# Patient Record
Sex: Female | Born: 1960 | Race: White | Hispanic: No | Marital: Married | Smoking: Never smoker
Health system: Southern US, Community
[De-identification: ages and names within clinical notes are randomized; demographics above are authoritative.]

## PROBLEM LIST (undated history)

## (undated) DIAGNOSIS — M1990 Unspecified osteoarthritis, unspecified site: Secondary | ICD-10-CM

## (undated) DIAGNOSIS — F909 Attention-deficit hyperactivity disorder, unspecified type: Secondary | ICD-10-CM

## (undated) DIAGNOSIS — I10 Essential (primary) hypertension: Secondary | ICD-10-CM

## (undated) DIAGNOSIS — J45909 Unspecified asthma, uncomplicated: Secondary | ICD-10-CM

## (undated) DIAGNOSIS — T7840XA Allergy, unspecified, initial encounter: Secondary | ICD-10-CM

## (undated) DIAGNOSIS — F419 Anxiety disorder, unspecified: Secondary | ICD-10-CM

## (undated) DIAGNOSIS — M8580 Other specified disorders of bone density and structure, unspecified site: Secondary | ICD-10-CM

## (undated) HISTORY — DX: Anxiety disorder, unspecified: F41.9

## (undated) HISTORY — DX: Attention-deficit hyperactivity disorder, unspecified type: F90.9

## (undated) HISTORY — DX: Unspecified asthma, uncomplicated: J45.909

## (undated) HISTORY — DX: Allergy, unspecified, initial encounter: T78.40XA

## (undated) HISTORY — PX: FOOT SURGERY: SHX648

## (undated) HISTORY — DX: Unspecified osteoarthritis, unspecified site: M19.90

## (undated) HISTORY — DX: Other specified disorders of bone density and structure, unspecified site: M85.80

## (undated) HISTORY — DX: Essential (primary) hypertension: I10

---

## 1999-04-30 ENCOUNTER — Other Ambulatory Visit: Admission: RE | Admit: 1999-04-30 | Discharge: 1999-04-30 | Payer: Self-pay | Admitting: Gynecology

## 2000-05-05 ENCOUNTER — Other Ambulatory Visit: Admission: RE | Admit: 2000-05-05 | Discharge: 2000-05-05 | Payer: Self-pay | Admitting: Gynecology

## 2000-06-30 ENCOUNTER — Other Ambulatory Visit: Admission: RE | Admit: 2000-06-30 | Discharge: 2000-06-30 | Payer: Self-pay | Admitting: Gynecology

## 2001-02-05 ENCOUNTER — Other Ambulatory Visit: Admission: RE | Admit: 2001-02-05 | Discharge: 2001-02-05 | Payer: Self-pay | Admitting: Gynecology

## 2001-07-01 ENCOUNTER — Other Ambulatory Visit: Admission: RE | Admit: 2001-07-01 | Discharge: 2001-07-01 | Payer: Self-pay | Admitting: Gynecology

## 2002-03-18 ENCOUNTER — Encounter: Admission: RE | Admit: 2002-03-18 | Discharge: 2002-03-18 | Payer: Self-pay | Admitting: Gynecology

## 2002-03-18 ENCOUNTER — Encounter: Payer: Self-pay | Admitting: Gynecology

## 2002-05-18 ENCOUNTER — Encounter: Admission: RE | Admit: 2002-05-18 | Discharge: 2002-05-18 | Payer: Self-pay | Admitting: Internal Medicine

## 2002-05-18 ENCOUNTER — Encounter: Payer: Self-pay | Admitting: Internal Medicine

## 2002-07-29 ENCOUNTER — Other Ambulatory Visit: Admission: RE | Admit: 2002-07-29 | Discharge: 2002-07-29 | Payer: Self-pay | Admitting: Gynecology

## 2003-02-24 ENCOUNTER — Other Ambulatory Visit: Admission: RE | Admit: 2003-02-24 | Discharge: 2003-02-24 | Payer: Self-pay | Admitting: Gynecology

## 2003-10-28 ENCOUNTER — Encounter: Admission: RE | Admit: 2003-10-28 | Discharge: 2003-10-28 | Payer: Self-pay | Admitting: Gynecology

## 2004-03-07 ENCOUNTER — Other Ambulatory Visit: Admission: RE | Admit: 2004-03-07 | Discharge: 2004-03-07 | Payer: Self-pay | Admitting: Gynecology

## 2005-03-19 ENCOUNTER — Ambulatory Visit: Payer: Self-pay | Admitting: Family Medicine

## 2005-04-23 ENCOUNTER — Other Ambulatory Visit: Admission: RE | Admit: 2005-04-23 | Discharge: 2005-04-23 | Payer: Self-pay | Admitting: Gynecology

## 2005-05-15 ENCOUNTER — Encounter: Admission: RE | Admit: 2005-05-15 | Discharge: 2005-05-15 | Payer: Self-pay | Admitting: Gynecology

## 2005-06-03 ENCOUNTER — Encounter: Admission: RE | Admit: 2005-06-03 | Discharge: 2005-06-03 | Payer: Self-pay | Admitting: Gynecology

## 2005-12-17 ENCOUNTER — Encounter: Admission: RE | Admit: 2005-12-17 | Discharge: 2005-12-17 | Payer: Self-pay | Admitting: Gynecology

## 2006-10-28 ENCOUNTER — Encounter: Admission: RE | Admit: 2006-10-28 | Discharge: 2006-10-28 | Payer: Self-pay | Admitting: Gynecology

## 2006-10-28 ENCOUNTER — Other Ambulatory Visit: Admission: RE | Admit: 2006-10-28 | Discharge: 2006-10-28 | Payer: Self-pay | Admitting: Gynecology

## 2006-10-30 ENCOUNTER — Encounter: Admission: RE | Admit: 2006-10-30 | Discharge: 2006-10-30 | Payer: Self-pay | Admitting: Gynecology

## 2007-12-24 ENCOUNTER — Encounter: Admission: RE | Admit: 2007-12-24 | Discharge: 2007-12-24 | Payer: Self-pay | Admitting: Gynecology

## 2009-01-03 ENCOUNTER — Encounter: Payer: Self-pay | Admitting: Family Medicine

## 2009-01-04 LAB — CONVERTED CEMR LAB
HDL: 94 mg/dL
LDL Cholesterol: 125 mg/dL
Pap Smear: NORMAL

## 2009-01-05 ENCOUNTER — Encounter: Admission: RE | Admit: 2009-01-05 | Discharge: 2009-01-05 | Payer: Self-pay | Admitting: Gynecology

## 2009-01-11 LAB — HM MAMMOGRAPHY: HM Mammogram: NORMAL

## 2009-02-01 ENCOUNTER — Encounter: Payer: Self-pay | Admitting: Family Medicine

## 2009-02-17 ENCOUNTER — Ambulatory Visit: Payer: Self-pay | Admitting: Family Medicine

## 2009-02-17 ENCOUNTER — Encounter: Payer: Self-pay | Admitting: Family Medicine

## 2009-02-17 DIAGNOSIS — M949 Disorder of cartilage, unspecified: Secondary | ICD-10-CM

## 2009-02-17 DIAGNOSIS — M2550 Pain in unspecified joint: Secondary | ICD-10-CM | POA: Insufficient documentation

## 2009-02-17 DIAGNOSIS — M899 Disorder of bone, unspecified: Secondary | ICD-10-CM

## 2009-02-17 DIAGNOSIS — M8580 Other specified disorders of bone density and structure, unspecified site: Secondary | ICD-10-CM | POA: Insufficient documentation

## 2009-02-17 DIAGNOSIS — M1990 Unspecified osteoarthritis, unspecified site: Secondary | ICD-10-CM | POA: Insufficient documentation

## 2009-02-17 LAB — CONVERTED CEMR LAB: Anti Nuclear Antibody(ANA): NEGATIVE

## 2009-02-20 ENCOUNTER — Encounter: Payer: Self-pay | Admitting: Family Medicine

## 2009-02-20 LAB — CONVERTED CEMR LAB
Basophils Absolute: 0 10*3/uL (ref 0.0–0.1)
HCT: 37.7 % (ref 36.0–46.0)
Lymphs Abs: 1.7 10*3/uL (ref 0.7–4.0)
Monocytes Relative: 9.9 % (ref 3.0–12.0)
Platelets: 212 10*3/uL (ref 150.0–400.0)
RDW: 12.3 % (ref 11.5–14.6)
Rhuematoid fact SerPl-aCnc: 20.1 intl units/mL — ABNORMAL HIGH (ref 0.0–20.0)

## 2009-03-24 ENCOUNTER — Encounter: Payer: Self-pay | Admitting: Family Medicine

## 2009-06-26 ENCOUNTER — Ambulatory Visit: Payer: Self-pay | Admitting: Family Medicine

## 2010-03-05 ENCOUNTER — Encounter: Admission: RE | Admit: 2010-03-05 | Discharge: 2010-03-05 | Payer: Self-pay | Admitting: Gynecology

## 2010-05-15 NOTE — Assessment & Plan Note (Signed)
Summary: R THUMB PAIN/CLE   Vital Signs:  Patient profile:   50 year old female Weight:      121.50 pounds Temp:     98.5 degrees F oral Pulse rate:   64 / minute Pulse rhythm:   regular BP sitting:   122 / 84  (left arm) Cuff size:   regular  Vitals Entered By: Sydell Axon LPN (June 26, 2009 2:18 PM) CC: Right thumb and wrist pain X 3-4 days, may have injured hand pulling on her goats   History of Present Illness: Pt here for a sore thumb which she thinks she injured pulling on her goats. It is minimally swollen but hurts in almost any direction. She has not had problems with this befiore and difd not hit it on anything.   Problems Prior to Update: 1)  Preventive Health Care  (ICD-V70.0) 2)  Osteopenia  (ICD-733.90) 3)  Osteoarthritis  (ICD-715.90) 4)  Polyarthritis  (ICD-719.49)  Medications Prior to Update: 1)  Lexapro 20 Mg Tabs (Escitalopram Oxalate) .... Take 1 Tablet By Mouth Once A Day 2)  Loratadine 10 Mg Tabs (Loratadine) .... Take 1 Tablet By Mouth Once A Day 3)  Vitamin D 1000 Unit  Tabs (Cholecalciferol) .... 4,000 International Units Daily  Allergies: No Known Drug Allergies  Physical Exam  General:  Well-developed,well-nourished,in no acute distress; alert,appropriate and cooperative throughout examination Msk:  Pt is LHD, her right thumb is sore with ROM slightly decreased from discomfort. She has slight amt of echymosis in the volar aspect of the wr8ist over the wrist creases but no swelling or tenderness to palpation.    Impression & Recommendations:  Problem # 1:  STRAIN OF THUMB OF RIGHT  HAND, LHD (ICD-842.10) Assessment New Discussed heat and ice. Applied Ace wrap and suggested using only half the length.  Complete Medication List: 1)  Lexapro 20 Mg Tabs (Escitalopram oxalate) .... Take 1 tablet by mouth once a day 2)  Loratadine 10 Mg Tabs (Loratadine) .... Take 1 tablet by mouth once a day 3)  Vitamin D 1000 Unit Tabs (Cholecalciferol) ....  4,000 international units daily  Current Allergies (reviewed today): No known allergies

## 2011-04-05 ENCOUNTER — Other Ambulatory Visit: Payer: Self-pay | Admitting: Gynecology

## 2011-04-05 DIAGNOSIS — Z1231 Encounter for screening mammogram for malignant neoplasm of breast: Secondary | ICD-10-CM

## 2011-04-24 ENCOUNTER — Ambulatory Visit
Admission: RE | Admit: 2011-04-24 | Discharge: 2011-04-24 | Disposition: A | Discharge status: Home / Self Care | Payer: BC Managed Care – PPO | Source: Ambulatory Visit | Attending: Gynecology | Admitting: Gynecology

## 2011-04-24 DIAGNOSIS — Z1231 Encounter for screening mammogram for malignant neoplasm of breast: Secondary | ICD-10-CM

## 2011-11-22 ENCOUNTER — Encounter: Payer: Self-pay | Admitting: Family Medicine

## 2011-11-22 ENCOUNTER — Ambulatory Visit (INDEPENDENT_AMBULATORY_CARE_PROVIDER_SITE_OTHER): Payer: BC Managed Care – PPO | Admitting: Family Medicine

## 2011-11-22 VITALS — BP 120/84 | HR 83 | Temp 98.8°F | Ht 62.0 in | Wt 121.5 lb

## 2011-11-22 DIAGNOSIS — J019 Acute sinusitis, unspecified: Secondary | ICD-10-CM

## 2011-11-22 DIAGNOSIS — J9801 Acute bronchospasm: Secondary | ICD-10-CM

## 2011-11-22 MED ORDER — LEVOFLOXACIN 500 MG PO TABS: 500.0000 mg | ORAL_TABLET | Freq: Every day | ORAL | Status: AC

## 2011-11-22 NOTE — Patient Instructions (Addendum)
Symbicort, 1-2 puffs every 12 hours while sick

## 2011-11-22 NOTE — Progress Notes (Signed)
Nature conservation officer at [facility] [address] [place] [address] Phone: [phone] Fax: [phone]  Date:  11/22/2011   Name:  Tricia Munoz   DOB:  1961-01-09   MRN:  [id number] Gender: female  Age: 51 y.o.  PCP:  Ruthe Mannan, MD    Chief Complaint: Cough and Otalgia   History of Present Illness:  Tricia Munoz is a 51 y.o. very pleasant female patient who presents with the following:  A couple of weeks ago, started out not feeling all that well, then July 25 started to develop a cough. To the point of some urge incont, on a zpak right now. Facial pain Now still feels bad  Now still coughing really bad and ears are really bothering her.   Some tightness in her chest. No history of asthma. Nonsmoker  Past Medical History, Surgical History, Social History, Family History, Problem List, Medications, and Allergies have been reviewed and updated if relevant.  Current Outpatient Prescriptions on File Prior to Visit  Medication Sig Dispense Refill  . DULoxetine (CYMBALTA) 60 MG capsule Take 60 mg by mouth daily.        Review of Systems: ROS: GEN: Acute illness details above GI: Tolerating PO intake GU: maintaining adequate hydration and urination Pulm: No SOB Interactive and getting along well at home.  Otherwise, ROS is as per the HPI.   Physical Examination: Filed Vitals:   11/22/11 0751  BP: 120/84  Pulse: 83  Temp: 98.8 F (37.1 C)   Filed Vitals:   11/22/11 0751  Height: 5\' 2"  (1.575 m)  Weight: 121 lb 8 oz (55.112 kg)   Body mass index is 22.22 kg/(m^2). Ideal Body Weight: Weight in (lb) to have BMI = 25: 136.4    Gen: WDWN, NAD; alert,appropriate and cooperative throughout exam  HEENT: Normocephalic and atraumatic. Throat clear, w/o exudate, no LAD, R TM clear, L TM - good landmarks, No fluid present. rhinnorhea.  Left frontal and maxillary sinuses: Tender at max Right frontal and maxillary sinuses: Tender at max  Neck: No ant or  post LAD CV: RRR, No M/G/R Pulm: Breathing comfortably in no resp distress. Decreased bs with rare wheeze, no focal crackles Abd: S,NT,ND,+BS Extr: no c/c/e Psych: full affect, pleasant   Assessment and Plan:  1. Sinusitis acute   2. Bronchospasm    Broaden to LVQ Suspect post-infl cough and bronchospasm at play  Unable to obtain Albuterol due to cost Given symbicort 160 with strict bid instructions now while sick, sample  Orders Today:  Orders Placed This Encounter  Procedures  . HM MAMMOGRAPHY    This external order was created through the Results Console.  Marland Kitchen HM PAP SMEAR    This external order was created through the Results Console.    Medications Today: (Includes new updates added during medication reconciliation) Meds ordered this encounter  Medications  . DISCONTD: citalopram (CELEXA) 40 MG tablet    Sig:   . azithromycin (ZITHROMAX) 250 MG tablet    Sig:   . benzonatate (TESSALON) 100 MG capsule    Sig:   . DULoxetine (CYMBALTA) 60 MG capsule    Sig: Take 60 mg by mouth daily.  . fish oil-omega-3 fatty acids 1000 MG capsule    Sig: Take 2 g by mouth daily.  . Flaxseed, Linseed, (FLAX SEED OIL) 1000 MG CAPS    Sig: Take by mouth.  . cholecalciferol (VITAMIN D) 1000 UNITS tablet    Sig: Take 2,000 Units  by mouth daily.  Marland Kitchen levofloxacin (LEVAQUIN) 500 MG tablet    Sig: Take 1 tablet (500 mg total) by mouth daily.    Dispense:  10 tablet    Refill:  0    Medications Discontinued: Medications Discontinued During This Encounter  Medication Reason  . citalopram (CELEXA) 40 MG tablet Error    Labs Results from 11/22/2011 that have returned and recent labs: Results for orders placed in visit on 11/22/11  HM MAMMOGRAPHY      Component Value Range   HM Mammogram normal    HM PAP SMEAR      Component Value Range   HM Pap smear done       Hannah Beat, MD

## 2012-01-03 ENCOUNTER — Ambulatory Visit (INDEPENDENT_AMBULATORY_CARE_PROVIDER_SITE_OTHER): Payer: BC Managed Care – PPO | Admitting: Family Medicine

## 2012-01-03 ENCOUNTER — Encounter: Payer: Self-pay | Admitting: Family Medicine

## 2012-01-03 VITALS — BP 126/72 | HR 84 | Temp 98.4°F | Wt 118.5 lb

## 2012-01-03 DIAGNOSIS — J019 Acute sinusitis, unspecified: Secondary | ICD-10-CM

## 2012-01-03 DIAGNOSIS — J0100 Acute maxillary sinusitis, unspecified: Secondary | ICD-10-CM | POA: Insufficient documentation

## 2012-01-03 MED ORDER — DOXYCYCLINE HYCLATE 100 MG PO CAPS: 100.0000 mg | ORAL_CAPSULE | Freq: Two times a day (BID) | ORAL | Status: DC

## 2012-01-03 NOTE — Patient Instructions (Signed)
You have a sinus infection. Take medicine as prescribed: doxycycline for 10 days Push fluids and plenty of rest. Nasal saline irrigation or neti pot to help drain sinuses. May use simple mucinex with plenty of fluid to help mobilize mucous. Let us know if fever >101.5, trouble opening/closing mouth, difficulty swallowing, or worsening - you may need to be seen again. 

## 2012-01-03 NOTE — Assessment & Plan Note (Signed)
Given duration, will treat as acute bacterial sinusitis with 10 d course of doxy. Continue other regimen including flonase, zyrtec. Add mucinex. Update [place] if sxs not improving as expected.

## 2012-01-03 NOTE — Progress Notes (Signed)
  Subjective:    Patient ID: Tricia Munoz, female    DOB: 23-Nov-1960, 51 y.o.   MRN: [id number]  HPI CC: Sinusitis?  Seen here 11/2011 by Dr. Patsy Lager for allergy flare, dx with sinusitis and treatd with levaquin.  Initally seen by [facility] and placed on zpack.  Changed to levaquin and symbicort by Dr. Salena Saner, sxs improved.  Now with 1.5 wk h/o coughing, nasal congestion, blowing nose productive of yellow mucous, maxillary sinus pressure HA, jaw pain, +PNDrainage.  Uses zyrtec, flonase regularly.  Takes albuterol prn.  Used twice today.  No fevers/chills, abd pain, n/v, ear or tooth pain, rashes.  No sick contacts at home.  No smokers at home.  H/o asthma related allergies.  Has not started allergy shots yet.  Pending starting this.  Sees Dr. Algodones Callas.  Past Medical History  Diagnosis Date  . OA (osteoarthritis)   . Osteopenia   . Hypertension     Review of Systems Per HPI    Objective:   Physical Exam  Nursing note and vitals reviewed. Constitutional: She appears well-developed and well-nourished. No distress.  HENT:  Head: Normocephalic and atraumatic.  Right Ear: Hearing, tympanic membrane, external ear and ear canal normal.  Left Ear: Hearing, external ear and ear canal normal.  Nose: No mucosal edema or rhinorrhea. Right sinus exhibits maxillary sinus tenderness. Right sinus exhibits no frontal sinus tenderness. Left sinus exhibits no maxillary sinus tenderness and no frontal sinus tenderness.  Mouth/Throat: Uvula is midline, oropharynx is clear and moist and mucous membranes are normal. No oropharyngeal exudate, posterior oropharyngeal edema, posterior oropharyngeal erythema or tonsillar abscesses.       Cerumen covering L TM  Eyes: Conjunctivae normal and EOM are normal. Pupils are equal, round, and reactive to light. No scleral icterus.  Neck: Normal range of motion. Neck supple.  Cardiovascular: Normal rate, regular rhythm, normal heart sounds and intact distal pulses.   No  murmur heard. Pulmonary/Chest: Effort normal and breath sounds normal. No respiratory distress. She has no wheezes. She has no rales.  Lymphadenopathy:    She has no cervical adenopathy.  Skin: Skin is warm and dry. No rash noted.       Assessment & Plan:

## 2012-03-18 ENCOUNTER — Ambulatory Visit (INDEPENDENT_AMBULATORY_CARE_PROVIDER_SITE_OTHER): Payer: BC Managed Care – PPO | Admitting: Family Medicine

## 2012-03-18 ENCOUNTER — Encounter: Payer: Self-pay | Admitting: Family Medicine

## 2012-03-18 VITALS — BP 110/84 | HR 84 | Temp 98.6°F | Wt 119.0 lb

## 2012-03-18 DIAGNOSIS — I10 Essential (primary) hypertension: Secondary | ICD-10-CM | POA: Insufficient documentation

## 2012-03-18 DIAGNOSIS — Z23 Encounter for immunization: Secondary | ICD-10-CM

## 2012-03-18 LAB — CBC WITH DIFFERENTIAL/PLATELET
Basophils Relative: 1 % (ref 0.0–3.0)
Eosinophils Relative: 1.1 % (ref 0.0–5.0)
Hemoglobin: 12.1 g/dL (ref 12.0–15.0)
Lymphocytes Relative: 35.3 % (ref 12.0–46.0)
Monocytes Relative: 7.3 % (ref 3.0–12.0)
Neutro Abs: 2.5 10*3/uL (ref 1.4–7.7)
RBC: 3.69 Mil/uL — ABNORMAL LOW (ref 3.87–5.11)
WBC: 4.5 10*3/uL (ref 4.5–10.5)

## 2012-03-18 LAB — COMPREHENSIVE METABOLIC PANEL
AST: 18 U/L (ref 0–37)
BUN: 16 mg/dL (ref 6–23)
CO2: 28 mEq/L (ref 19–32)
Calcium: 9 mg/dL (ref 8.4–10.5)
Chloride: 102 mEq/L (ref 96–112)
Creatinine, Ser: 0.8 mg/dL (ref 0.4–1.2)
GFR: 83.23 mL/min (ref >60.00)

## 2012-03-18 LAB — TSH: TSH: 0.42 u[IU]/mL (ref 0.35–5.50)

## 2012-03-18 MED ORDER — BENAZEPRIL-HYDROCHLOROTHIAZIDE 10-12.5 MG PO TABS: 1.0000 | ORAL_TABLET | Freq: Every day | ORAL | Status: DC

## 2012-03-18 MED ORDER — VENLAFAXINE HCL ER 150 MG PO CP24: 150.0000 mg | ORAL_CAPSULE | Freq: Every day | ORAL | Status: DC

## 2012-03-18 NOTE — Addendum Note (Signed)
Addended by: Eliezer Bottom on: 03/18/2012 11:15 AM   Modules accepted: Orders

## 2012-03-18 NOTE — Progress Notes (Signed)
Subjective:    Patient ID: Tricia Munoz, female    DOB: 06/15/1960, 51 y.o.   MRN: [id number]  HPI  51 yo pt whom I have not seen in over 3 years here to discuss BP.  Her OBGYN started her on Benicar HCT 40-12.5 - 1/2 tablet daily a year or so ago. BP had been controlled but she has had intermittent episodes of dizziness and HA and she thinks it is related to her blood pressure. At home, readings are sometimes as high as 140/82.  She also recently stopped HRT and not sure if that is related.  Benicar is very expensive and she would like to try something else.  Patient Active Problem List  Diagnosis  . OSTEOARTHRITIS  . POLYARTHRITIS  . OSTEOPENIA  . Sinusitis acute  . HTN (hypertension)   Past Medical History  Diagnosis Date  . OA (osteoarthritis)   . Osteopenia   . Hypertension    No past surgical history on file. History  Substance Use Topics  . Smoking status: Never Smoker   . Smokeless tobacco: Not on file  . Alcohol Use: Yes     Comment: Occasional   Family History  Problem Relation Age of Onset  . Dementia Mother   . Hypertension Father   . Kidney disease Sister    No Known Allergies Current Outpatient Prescriptions on File Prior to Visit  Medication Sig Dispense Refill  . albuterol (PROVENTIL HFA;VENTOLIN HFA) 108 (90 BASE) MCG/ACT inhaler Inhale 2 puffs into the lungs every 6 (six) hours as needed.      . cetirizine (ZYRTEC) 10 MG tablet Take 10 mg by mouth daily.      . cholecalciferol (VITAMIN D) 1000 UNITS tablet Take 1,000 Units by mouth daily.       . fluticasone (FLONASE) 50 MCG/ACT nasal spray Place 2 sprays into the nose daily.      Marland Kitchen venlafaxine XR (EFFEXOR-XR) 150 MG 24 hr capsule Take 1 capsule (150 mg total) by mouth daily.  30 capsule  11  . benazepril-hydrochlorthiazide (LOTENSIN HCT) 10-12.5 MG per tablet Take 1 tablet by mouth daily.  30 tablet  1   The PMH, PSH, Social History, Family History, Medications, and allergies have been  reviewed in [facility], and have been updated if relevant.   Review of Systems See HPI No LE No SOB No CP No hot or cold intolerance or changes in her bowel habits    Objective:   Physical Exam BP 110/84  Pulse 84  Temp 98.6 F (37 C)  Wt 119 lb (53.978 kg) BP Readings from Last 3 Encounters:  03/18/12 110/84  01/03/12 126/72  11/22/11 120/84   General:  Well-developed,well-nourished,in no acute distress; alert,appropriate and cooperative throughout examination Head:  normocephalic and atraumatic.   Eyes:  vision grossly intact, pupils equal, pupils round, and pupils reactive to light.   Ears:  R ear normal and L ear normal.   Nose:  no external deformity.   Mouth:  good dentition.   Lungs:  Normal respiratory effort, chest expands symmetrically. Lungs are clear to auscultation, no crackles or wheezes. Heart:  Normal rate and regular rhythm. S1 and S2 normal without gallop, murmur, click, rub or other extra sounds. Msk:  No deformity or scoliosis noted of thoracic or lumbar spine.   Extremities:  No clubbing, cyanosis, edema, or deformity noted with normal full range of motion of all joints.   Neurologic:  alert & oriented X3 and gait normal.  Skin:  Intact without suspicious lesions or rashes Psych:  Cognition and judgment appear intact. Alert and cooperative with normal attention span and concentration. No apparent delusions, illusions, hallucinations       Assessment & Plan:   1. HTN (hypertension)  Comprehensive metabolic panel, CBC with Differential, TSH   >25 min spent with face to face with patient, >50% counseling and/or coordinating care  Normotensive today.  Will check lab work to rule out other causes of fluctuating BP.  I anticipate that stopping HRT may be playing a roll. D/c Benicar due to cost.  Start benazapril HCTZ- she will call me in 1-2 weeks with an update of her blood pressure.

## 2012-03-18 NOTE — Patient Instructions (Addendum)
Great to see you.  We will call you with your lab work. Please STOP taking Benicar HCTZ and start taking benazapril HCTZ.  Please call me in 1-2 weeks with an update of your blood pressures on your new medication.

## 2012-03-19 ENCOUNTER — Encounter: Payer: Self-pay | Admitting: *Deleted

## 2012-03-27 ENCOUNTER — Encounter: Payer: Self-pay | Admitting: Family Medicine

## 2012-03-27 ENCOUNTER — Ambulatory Visit (INDEPENDENT_AMBULATORY_CARE_PROVIDER_SITE_OTHER): Payer: BC Managed Care – PPO | Admitting: Family Medicine

## 2012-03-27 VITALS — BP 122/80 | Temp 98.0°F | Wt 120.0 lb

## 2012-03-27 DIAGNOSIS — J329 Chronic sinusitis, unspecified: Secondary | ICD-10-CM

## 2012-03-27 MED ORDER — CEFUROXIME AXETIL 250 MG PO TABS: 250.0000 mg | ORAL_TABLET | Freq: Two times a day (BID) | ORAL | Status: DC

## 2012-03-27 MED ORDER — FLUCONAZOLE 150 MG PO TABS: 150.0000 mg | ORAL_TABLET | Freq: Once | ORAL | Status: DC

## 2012-03-27 NOTE — Progress Notes (Signed)
SUBJECTIVE:  Tricia Munoz is a 51 y.o. female who complains of coryza, congestion, sneezing, sore throat, dry cough, fever Tmax 100 and right ear pain for 6 days. She denies a history of anorexia and chest pain and admits to a history of asthma. Patient denies smoke cigarettes.   Patient Active Problem List  Diagnosis  . OSTEOARTHRITIS  . POLYARTHRITIS  . OSTEOPENIA  . Sinusitis acute  . HTN (hypertension)   Past Medical History  Diagnosis Date  . OA (osteoarthritis)   . Osteopenia   . Hypertension    No past surgical history on file. History  Substance Use Topics  . Smoking status: Never Smoker   . Smokeless tobacco: Not on file  . Alcohol Use: Yes     Comment: Occasional   Family History  Problem Relation Age of Onset  . Dementia Mother   . Hypertension Father   . Kidney disease Sister    No Known Allergies Current Outpatient Prescriptions on File Prior to Visit  Medication Sig Dispense Refill  . albuterol (PROVENTIL HFA;VENTOLIN HFA) 108 (90 BASE) MCG/ACT inhaler Inhale 2 puffs into the lungs every 6 (six) hours as needed.      . benazepril-hydrochlorthiazide (LOTENSIN HCT) 10-12.5 MG per tablet Take 1 tablet by mouth daily.  30 tablet  1  . cetirizine (ZYRTEC) 10 MG tablet Take 10 mg by mouth daily.      . cholecalciferol (VITAMIN D) 1000 UNITS tablet Take 1,000 Units by mouth daily.       . fluticasone (FLONASE) 50 MCG/ACT nasal spray Place 2 sprays into the nose daily.      Marland Kitchen venlafaxine XR (EFFEXOR-XR) 150 MG 24 hr capsule Take 1 capsule (150 mg total) by mouth daily.  30 capsule  11   The PMH, PSH, Social History, Family History, Medications, and allergies have been reviewed in [facility], and have been updated if relevant.  OBJECTIVE: BP 122/80  Temp 98 F (36.7 C)  Wt 120 lb (54.432 kg)  She appears well, vital signs are as noted. Right TM dull and bulding. Throat and pharynx normal.  Neck supple. No adenopathy in the neck. Nose is congested. Sinuses non  tender. Bilateral scattered exp wheezes.  ASSESSMENT:  serous otitis and bronchitis  PLAN: Ceftin 250 mg twice daily x 10 days. Symptomatic therapy suggested: push fluids, rest and return office visit prn if symptoms persist or worsen. Call or return to clinic prn if these symptoms worsen or fail to improve as anticipated.

## 2012-03-27 NOTE — Patient Instructions (Addendum)
Good to see you. Have a wonderful Christmas. Please take Ceftin as directed- 1 tablet twice daily x 10 days.  Diflucan 150 mg daily if you develop vaginal itching or discharge.

## 2012-04-02 ENCOUNTER — Telehealth: Payer: Self-pay | Admitting: Family Medicine

## 2012-04-02 NOTE — Telephone Encounter (Signed)
She needs to be seen.  Please put her on my schedule for tomorrow.

## 2012-04-02 NOTE — Telephone Encounter (Signed)
Patient Information:  Caller Name: Lavilla  Phone: [phone]  Patient: Tricia Munoz  Gender: Female  DOB: Nov 17, 1960  Age: 51 Years  PCP: Ruthe Mannan [facility])  Pregnant: No  Office Follow Up:  Does the office need to follow up with this patient?: Yes  Instructions For The Office: Patient will need to be seen for medication adjustment within the next 2 weeks. Please contact for assistance.  HEADACHES IN THE MORNING ELEVATED 160/90.  RN Note:  Pleaes contact patient for medication adjustment.  Symptoms  Reason For Call & Symptoms: Patient states she was placed on B/p two weeks ago 03/18/12.  Lotensin HCT 10/12.5mg .  She states since starting the medication that first thing in the morning her pressure high.Marland Kitchenas the day progresses she is better.  At 6;20 this morning 160/92. She is awakening at night with terrible Headaches.  She takes her medication in the morning and she is fine the rest of the day.  Reviewed Health History In EMR: Yes  Reviewed Medications In EMR: Yes  Reviewed Allergies In EMR: Yes  Reviewed Surgeries / Procedures: No  Date of Onset of Symptoms: 03/18/2012 OB:  LMP: Unknown  Guideline(s) Used:  High Blood Pressure  Disposition Per Guideline:   See Within 2 Weeks in Office  Reason For Disposition Reached:   BP > 140/90 and is taking BP medications  Advice Given:  General:  Untreated high blood pressure may cause damage to the heart, brain, kidneys, and eyes.  The goal of blood pressure treatment for most patients with hypertension is to keep the blood pressure under 140/90.  Lifestyle Changes  Maintain a healthy weight. Lose weight if you are overweight.  Do 30 minutes of aerobic physical activity (e.g., brisk walking) most days of the week.  Eat a diet high in fresh fruits and low-fat dairy products. Limit your intake of saturated and total fat. Choose foods that are lower in salt.  If you smoke, you should stop.  If you drink alcohol, you  should limit your daily alcohol drinking. Women should have no more than one drink per day. Men should have no more than 2 drinks per day. A drink is defined as 1.5 oz hard liquor (one shot or jigger; 45 ml), 5 oz wine (small glass; 150 ml), or 12 oz beer (one can; 360 ml).  Call Back If:  Headache, blurred vision, difficulty talking, or difficulty walking occurs  Chest pain or difficulty breathing occurs  You want to go in to the office for a blood pressure check  You become worse.  How Much Sodium (Salt) Should You Eat Each Day?  Aim to eat less than 1,500 mg of sodium each day.  Remember that one teaspoon of salt has 2,300 mg of sodium.  Unfortunately, 75% of the salt in the average person's diet is in pre-processed foods.

## 2012-04-02 NOTE — Telephone Encounter (Signed)
Appointment made for tomorrow, advised patient.

## 2012-04-03 ENCOUNTER — Encounter: Payer: Self-pay | Admitting: Family Medicine

## 2012-04-03 ENCOUNTER — Ambulatory Visit (INDEPENDENT_AMBULATORY_CARE_PROVIDER_SITE_OTHER): Payer: BC Managed Care – PPO | Admitting: Family Medicine

## 2012-04-03 VITALS — BP 122/80 | HR 62 | Temp 98.0°F | Resp 22 | Wt 119.0 lb

## 2012-04-03 DIAGNOSIS — I10 Essential (primary) hypertension: Secondary | ICD-10-CM

## 2012-04-03 NOTE — Progress Notes (Signed)
Subjective:    Patient ID: Tricia Munoz, female    DOB: [birth year], 51 y.o.   MRN: [id number]  HPI  51 yo pt here for BP concerns.  Checked BP on home cuff and it was 160/90 and had a HA.    Her OBGYN started her on Benicar HCT 40-12.5 - 1/2 tablet daily a year or so ago. BP had been controlled but she has had intermittent episodes of dizziness and HA and she thinks it is related to her blood pressure. At home, readings are sometimes as high as 140/82.  She also recently stopped HRT and not sure if that is related.  Benicar is very expensive so we d/c'd and started benazpril- HCTZ 10-12.5 mg daily on 03/18/2012.  Patient Active Problem List  Diagnosis  . OSTEOARTHRITIS  . POLYARTHRITIS  . OSTEOPENIA  . Sinusitis acute  . HTN (hypertension)   Past Medical History  Diagnosis Date  . OA (osteoarthritis)   . Osteopenia   . Hypertension    No past surgical history on file. History  Substance Use Topics  . Smoking status: Never Smoker   . Smokeless tobacco: Not on file  . Alcohol Use: Yes     Comment: Occasional   Family History  Problem Relation Age of Onset  . Dementia Mother   . Hypertension Father   . Kidney disease Sister    No Known Allergies Current Outpatient Prescriptions on File Prior to Visit  Medication Sig Dispense Refill  . albuterol (PROVENTIL HFA;VENTOLIN HFA) 108 (90 BASE) MCG/ACT inhaler Inhale 2 puffs into the lungs every 6 (six) hours as needed.      . benazepril-hydrochlorthiazide (LOTENSIN HCT) 10-12.5 MG per tablet Take 1 tablet by mouth daily.  30 tablet  1  . cefUROXime (CEFTIN) 250 MG tablet Take 1 tablet (250 mg total) by mouth 2 (two) times daily.  20 tablet  0  . cetirizine (ZYRTEC) 10 MG tablet Take 10 mg by mouth daily.      . cholecalciferol (VITAMIN D) 1000 UNITS tablet Take 1,000 Units by mouth daily.       . fluconazole (DIFLUCAN) 150 MG tablet Take 1 tablet (150 mg total) by mouth once.  1 tablet  0  . fluticasone (FLONASE) 50  MCG/ACT nasal spray Place 2 sprays into the nose daily.      Marland Kitchen venlafaxine XR (EFFEXOR-XR) 150 MG 24 hr capsule Take 1 capsule (150 mg total) by mouth daily.  30 capsule  11   The PMH, PSH, Social History, Family History, Medications, and allergies have been reviewed in [facility], and have been updated if relevant.   Review of Systems See HPI No LE No SOB No CP No hot or cold intolerance or changes in her bowel habits    Objective:   Physical Exam BP 122/80  Pulse 62  Temp 98 F (36.7 C)  Resp 22  Wt 119 lb (53.978 kg) BP Readings from Last 3 Encounters:  04/03/12 122/80  03/27/12 122/80  03/18/12 110/84   General:  Well-developed,well-nourished,in no acute distress; alert,appropriate and cooperative throughout examination Head:  normocephalic and atraumatic.   Eyes:  vision grossly intact, pupils equal, pupils round, and pupils reactive to light.   Ears:  R ear normal and L ear normal.   Nose:  no external deformity.   Mouth:  good dentition.   Lungs:  Normal respiratory effort, chest expands symmetrically. Lungs are clear to auscultation, no crackles or wheezes. Heart:  Normal rate and regular  rhythm. S1 and S2 normal without gallop, murmur, click, rub or other extra sounds. Msk:  No deformity or scoliosis noted of thoracic or lumbar spine.   Extremities:  No clubbing, cyanosis, edema, or deformity noted with normal full range of motion of all joints.   Neurologic:  alert & oriented X3 and gait normal.   Skin:  Intact without suspicious lesions or rashes Psych:  Cognition and judgment appear intact. Alert and cooperative with normal attention span and concentration. No apparent delusions, illusions, hallucinations       Assessment & Plan:   1. HTN (hypertension)   Normotensive here today.   Brings in her home BP cuff which shows that her BP is 140/90, compared to ours checked twice 122/80. Advised to get an OMRON cuff.  Call us next week with update of readings. No  changes in medication.  Also advised keeping a headache journal as she could not characterize location or associated symptoms of headaches today.  The patient indicates understanding of these issues and agrees with the plan.

## 2012-04-03 NOTE — Patient Instructions (Addendum)
Let's try an OMRON cuff.  Have a wonderful holiday.

## 2012-04-13 ENCOUNTER — Ambulatory Visit (INDEPENDENT_AMBULATORY_CARE_PROVIDER_SITE_OTHER): Payer: BC Managed Care – PPO | Admitting: Family Medicine

## 2012-04-13 ENCOUNTER — Encounter: Payer: Self-pay | Admitting: Family Medicine

## 2012-04-13 VITALS — BP 110/70 | HR 85 | Temp 98.3°F | Wt 118.0 lb

## 2012-04-13 DIAGNOSIS — J029 Acute pharyngitis, unspecified: Secondary | ICD-10-CM

## 2012-04-13 DIAGNOSIS — J309 Allergic rhinitis, unspecified: Secondary | ICD-10-CM

## 2012-04-13 MED ORDER — AZITHROMYCIN 250 MG PO TABS: ORAL_TABLET | ORAL | Status: DC

## 2012-04-13 NOTE — Progress Notes (Signed)
Nature conservation officer at [facility] [address] [place] [address] Phone: [phone] Fax: [phone]  Date:  04/13/2012   Name:  SHAUNTA ONCALE   DOB:  02/21/1961   MRN:  [id number] Gender: female Age: 51 y.o.  PCP:  Ruthe Mannan, MD  Evaluating MD: Hannah Beat, MD   Chief Complaint: swollen glands   History of Present Illness:  Tricia Munoz is a 51 y.o. pleasant patient who presents with the following:  Swollen in throat and hurts to swallow, has progressively gotten worse, has been using her inhalers.  Having some cough --- inhalers are helping. Went to the allergist and has not started it.  Has been feeling bad since the summer.   Allergist recommended allergy shots -- doing zyrtec, flonase, albuterol  Some swollen glands  Patient Active Problem List  Diagnosis  . OSTEOARTHRITIS  . POLYARTHRITIS  . OSTEOPENIA  . Sinusitis acute  . HTN (hypertension)    Past Medical History  Diagnosis Date  . OA (osteoarthritis)   . Osteopenia   . Hypertension     No past surgical history on file.  History  Substance Use Topics  . Smoking status: Never Smoker   . Smokeless tobacco: Never Used  . Alcohol Use: Yes     Comment: Occasional    Family History  Problem Relation Age of Onset  . Dementia Mother   . Hypertension Father   . Kidney disease Sister     No Known Allergies  Medication list has been reviewed and updated.  Outpatient Prescriptions Prior to Visit  Medication Sig Dispense Refill  . albuterol (PROVENTIL HFA;VENTOLIN HFA) 108 (90 BASE) MCG/ACT inhaler Inhale 2 puffs into the lungs every 6 (six) hours as needed.      . benazepril-hydrochlorthiazide (LOTENSIN HCT) 10-12.5 MG per tablet Take 1 tablet by mouth daily.  30 tablet  1  . cetirizine (ZYRTEC) 10 MG tablet Take 10 mg by mouth daily.      . cholecalciferol (VITAMIN D) 1000 UNITS tablet Take 1,000 Units by mouth daily.       . fluticasone (FLONASE) 50 MCG/ACT nasal spray  Place 2 sprays into the nose daily.      Marland Kitchen venlafaxine XR (EFFEXOR-XR) 150 MG 24 hr capsule Take 1 capsule (150 mg total) by mouth daily.  30 capsule  11  . [DISCONTINUED] cefUROXime (CEFTIN) 250 MG tablet Take 1 tablet (250 mg total) by mouth 2 (two) times daily.  20 tablet  0  . [DISCONTINUED] fluconazole (DIFLUCAN) 150 MG tablet Take 1 tablet (150 mg total) by mouth once.  1 tablet  0   Last reviewed on 04/13/2012 11:54 AM by Sueanne Margarita, CMA  Review of Systems:  ROS: GEN: Acute illness details above GI: Tolerating PO intake GU: maintaining adequate hydration and urination Pulm: No SOB Interactive and getting along well at home.  Otherwise, ROS is as per the HPI.   Physical Examination: BP 110/70  Pulse 85  Temp 98.3 F (36.8 C) (Tympanic)  Wt 118 lb (53.524 kg)  SpO2 99%  Ideal Body Weight:     Gen: WDWN, NAD; A & O x3, cooperative. Pleasant.Globally Non-toxic HEENT: Normocephalic and atraumatic. Throat: swollen tonsills but no exudate R TM clear, L TM - good landmarks, No fluid present. rhinnorhea. No frontal or maxillary sinus T. MMM NECK: Anterior cervical  LAD is present - TTP CV: RRR, No M/G/R, cap refill <2 sec PULM: Breathing comfortably in no respiratory distress. no  wheezing, crackles, rhonchi ABD: S,NT,ND,+BS. No HSM. No rebound. EXT: No c/c/e PSYCH: Friendly, good eye contact   Assessment and Plan:  1. Pharyngitis    2. Sorethroat  POCT rapid strep A  3. Allergic rhinitis     Strep neg False pos vs atypical vs viral pharyngitis -- given pretest prob, will treat  Discussed allergies  Orders Today:  Orders Placed This Encounter  Procedures  . POCT rapid strep A    Updated Medication List: (Includes new medications, updates to list, dose adjustments) Meds ordered this encounter  Medications  . azithromycin (ZITHROMAX) 250 MG tablet    Sig: 2 tabs po on day 1, then 1 tab po for 4 days    Dispense:  6 tablet    Refill:  0     Medications Discontinued: Medications Discontinued During This Encounter  Medication Reason  . cefUROXime (CEFTIN) 250 MG tablet   . fluconazole (DIFLUCAN) 150 MG tablet      Hannah Beat, MD

## 2012-04-17 ENCOUNTER — Other Ambulatory Visit: Payer: Self-pay | Admitting: Gynecology

## 2012-04-17 DIAGNOSIS — Z1231 Encounter for screening mammogram for malignant neoplasm of breast: Secondary | ICD-10-CM

## 2012-05-12 ENCOUNTER — Ambulatory Visit
Admission: RE | Admit: 2012-05-12 | Discharge: 2012-05-12 | Disposition: A | Discharge status: Home / Self Care | Payer: BC Managed Care – PPO | Source: Ambulatory Visit | Attending: Gynecology | Admitting: Gynecology

## 2012-05-12 ENCOUNTER — Other Ambulatory Visit: Payer: Self-pay | Admitting: Family Medicine

## 2012-05-12 DIAGNOSIS — Z1231 Encounter for screening mammogram for malignant neoplasm of breast: Secondary | ICD-10-CM

## 2012-09-29 ENCOUNTER — Encounter: Payer: Self-pay | Admitting: Family Medicine

## 2012-09-29 ENCOUNTER — Ambulatory Visit (INDEPENDENT_AMBULATORY_CARE_PROVIDER_SITE_OTHER)
Admission: RE | Admit: 2012-09-29 | Discharge: 2012-09-29 | Disposition: A | Discharge status: Home / Self Care | Payer: BC Managed Care – PPO | Source: Ambulatory Visit | Attending: Family Medicine | Admitting: Family Medicine

## 2012-09-29 ENCOUNTER — Ambulatory Visit (INDEPENDENT_AMBULATORY_CARE_PROVIDER_SITE_OTHER): Payer: BC Managed Care – PPO | Admitting: Family Medicine

## 2012-09-29 VITALS — BP 104/80 | HR 76 | Temp 97.9°F | Wt 120.0 lb

## 2012-09-29 DIAGNOSIS — M25552 Pain in left hip: Secondary | ICD-10-CM | POA: Insufficient documentation

## 2012-09-29 DIAGNOSIS — M25559 Pain in unspecified hip: Secondary | ICD-10-CM

## 2012-09-29 NOTE — Patient Instructions (Addendum)
Hip Bursitis  Bursitis is a swelling and soreness (inflammation) of a fluid-filled sac (bursa). This sac overlies and protects the joints.   CAUSES   · Injury.  · Overuse of the muscles surrounding the joint.  · Arthritis.  · Gout.  · Infection.  · Cold weather.  · Inadequate warm-up and conditioning prior to activities.  The cause may not be known.   SYMPTOMS   · Mild to severe irritation.  · Tenderness and swelling over the outside of the hip.  · Pain with motion of the hip.  · If the bursa becomes infected, a fever may be present. Redness, tenderness, and warmth will develop over the hip.  Symptoms usually lessen in 3 to 4 weeks with treatment, but can come back.  TREATMENT  If conservative treatment does not work, your caregiver may advise draining the bursa and injecting cortisone into the area. This may speed up the healing process. This may also be used as an initial treatment of choice.  HOME CARE INSTRUCTIONS   · Apply ice to the affected area for 15-20 minutes every 3 to 4 hours while awake for the first 2 days. Put the ice in a plastic bag and place a towel between the bag of ice and your skin.  · Rest the painful joint as much as possible, but continue to put the joint through a normal range of motion at least 4 times per day. When the pain lessens, begin normal, slow movements and usual activities to help prevent stiffness of the hip.  · Only take over-the-counter or prescription medicines for pain, discomfort, or fever as directed by your caregiver.  · Use crutches to limit weight bearing on the hip joint, if advised.  · Elevate your painful hip to reduce swelling. Use pillows for propping and cushioning your legs and hips.  · Gentle massage may provide comfort and decrease swelling.  SEEK IMMEDIATE MEDICAL CARE IF:   · Your pain increases even during treatment, or you are not improving.  · You have a fever.  · You have heat and inflammation over the involved bursa.  · You have any other questions or  concerns.  MAKE SURE YOU:   · Understand these instructions.  · Will watch your condition.  · Will get help right away if you are not doing well or get worse.  Document Released: 09/21/2001 Document Revised: 06/24/2011 Document Reviewed: 04/20/2008  ExitCare® Patient Information ©2014 [employer].

## 2012-09-29 NOTE — Progress Notes (Signed)
  Subjective:    Patient ID: Tricia Munoz, female    DOB: [birth year], 52 y.o.   MRN: [id number]  HPI  Very pleasant and active 52 yo female here for left hip pain.  She power walks every day.  A few weeks ago, left hip pain that radiated to her buttocks.  She does not like taking NSAIDs (GI side effects), so she took Tylenol. Symptoms have since improved dramatically- improved to a level 1-2 from 8-10. She is able to go on her walks now.  No LE weakness.  No groin pain.  No changes in her bowel or bladder habits.  Patient Active Problem List   Diagnosis Date Noted  . Left hip pain 09/29/2012  . HTN (hypertension) 03/18/2012  . OSTEOARTHRITIS 02/17/2009  . POLYARTHRITIS 02/17/2009  . OSTEOPENIA 02/17/2009   Past Medical History  Diagnosis Date  . OA (osteoarthritis)   . Osteopenia   . Hypertension    No past surgical history on file. History  Substance Use Topics  . Smoking status: Never Smoker   . Smokeless tobacco: Never Used  . Alcohol Use: Yes     Comment: Occasional   Family History  Problem Relation Age of Onset  . Dementia Mother   . Hypertension Father   . Kidney disease Sister    No Known Allergies Current Outpatient Prescriptions on File Prior to Visit  Medication Sig Dispense Refill  . albuterol (PROVENTIL HFA;VENTOLIN HFA) 108 (90 BASE) MCG/ACT inhaler Inhale 2 puffs into the lungs every 6 (six) hours as needed.      . benazepril-hydrochlorthiazide (LOTENSIN HCT) 10-12.5 MG per tablet take 1 tablet by mouth daily  30 tablet  5  . cetirizine (ZYRTEC) 10 MG tablet Take 10 mg by mouth daily.      . cholecalciferol (VITAMIN D) 1000 UNITS tablet Take 1,000 Units by mouth daily.       . fluticasone (FLONASE) 50 MCG/ACT nasal spray Place 2 sprays into the nose daily.      Marland Kitchen venlafaxine XR (EFFEXOR-XR) 150 MG 24 hr capsule Take 1 capsule (150 mg total) by mouth daily.  30 capsule  11   No current facility-administered medications on file prior to visit.    The PMH, PSH, Social History, Family History, Medications, and allergies have been reviewed in [facility], and have been updated if relevant.   Review of Systems See HPI    Objective:   Physical Exam BP 104/80  Pulse 76  Temp(Src) 97.9 F (36.6 C)  Wt 120 lb (54.432 kg)  BMI 21.94 kg/m2 Gen:  Alert, pleasant, NAD MSK: Mildly TTP over left trochanteric bursa, FROM of hip, no pain with external or internal rotation     Assessment & Plan:  1. Left hip pain New- resolving. Consistent with trochanteric bursitis. Will treat conservatively, xray for further evaluation. See AVS. The patient indicates understanding of these issues and agrees with the plan.  - DG Hip Complete Left; Future

## 2012-10-27 ENCOUNTER — Other Ambulatory Visit: Payer: Self-pay | Admitting: Family Medicine

## 2013-02-18 ENCOUNTER — Other Ambulatory Visit: Payer: Self-pay

## 2013-02-21 ENCOUNTER — Other Ambulatory Visit: Payer: Self-pay | Admitting: Family Medicine

## 2013-02-22 NOTE — Telephone Encounter (Signed)
Last office visit 09/29/2012.  Ok to refill?

## 2013-03-24 ENCOUNTER — Ambulatory Visit (INDEPENDENT_AMBULATORY_CARE_PROVIDER_SITE_OTHER): Payer: BC Managed Care – PPO | Admitting: Internal Medicine

## 2013-03-24 ENCOUNTER — Encounter: Payer: Self-pay | Admitting: Internal Medicine

## 2013-03-24 VITALS — BP 132/84 | HR 67 | Temp 98.9°F | Wt 122.0 lb

## 2013-03-24 DIAGNOSIS — H6983 Other specified disorders of Eustachian tube, bilateral: Secondary | ICD-10-CM

## 2013-03-24 DIAGNOSIS — J019 Acute sinusitis, unspecified: Secondary | ICD-10-CM

## 2013-03-24 DIAGNOSIS — H6120 Impacted cerumen, unspecified ear: Secondary | ICD-10-CM

## 2013-03-24 DIAGNOSIS — H6980 Other specified disorders of Eustachian tube, unspecified ear: Secondary | ICD-10-CM

## 2013-03-24 DIAGNOSIS — H6122 Impacted cerumen, left ear: Secondary | ICD-10-CM

## 2013-03-24 MED ORDER — AZITHROMYCIN 250 MG PO TABS: ORAL_TABLET | ORAL | Status: DC

## 2013-03-24 NOTE — Progress Notes (Signed)
Subjective:    Patient ID: Tricia Munoz, female    DOB: [birth year], 52 y.o.   MRN: [id number]  HPI  Pt presents to the clinic today with c/o ear fullness and fatigue. This started about a week ago. She also has associated headaches, sinus pain and pressure. She is very sensitive to smells. She was at her parents house 2 weeks ago where they were burning wood. She thinks this has affected her sinuses. She does have allergies and asthma. She takes zyrtec daily and albuterol as needed. She denies fever, but has had chills and body aches.  Review of Systems      Past Medical History  Diagnosis Date  . OA (osteoarthritis)   . Osteopenia   . Hypertension     Current Outpatient Prescriptions  Medication Sig Dispense Refill  . albuterol (PROVENTIL HFA;VENTOLIN HFA) 108 (90 BASE) MCG/ACT inhaler Inhale 2 puffs into the lungs every 6 (six) hours as needed.      . benazepril-hydrochlorthiazide (LOTENSIN HCT) 10-12.5 MG per tablet take 1 tablet by mouth daily  30 tablet  5  . cetirizine (ZYRTEC) 10 MG tablet Take 10 mg by mouth daily.      . fluticasone (FLONASE) 50 MCG/ACT nasal spray Place 2 sprays into the nose daily.      Marland Kitchen venlafaxine XR (EFFEXOR-XR) 150 MG 24 hr capsule take 1 capsule by mouth daily  30 capsule  2  . cholecalciferol (VITAMIN D) 1000 UNITS tablet Take 1,000 Units by mouth daily.        No current facility-administered medications for this visit.    No Known Allergies  Family History  Problem Relation Age of Onset  . Dementia Mother   . Hypertension Father   . Kidney disease Sister     History   Social History  . Marital Status: Married    Spouse Name: N/A    Number of Children: 1  . Years of Education: N/A   Occupational History  . self employeed    Social History Main Topics  . Smoking status: Never Smoker   . Smokeless tobacco: Never Used  . Alcohol Use: Yes     Comment: Occasional  . Drug Use: No  . Sexual Activity: Not on file   Other  Topics Concern  . Not on file   Social History Narrative   Lives in [place] with husband. 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 78 year old daughter [place].      Walks regularly     Constitutional: Denies fever, malaise, fatigue, headache or abrupt weight changes.  HEENT: Denies eye pain, eye redness,  ringing in the ears, wax buildup, runny nose, nasal congestion, bloody nose, or sore throat. Respiratory: Denies difficulty breathing, shortness of breath, cough or sputum production.   Cardiovascular: Denies chest pain, chest tightness, palpitations or swelling in the hands or feet.    No other specific complaints in a complete review of systems (except as listed in HPI above).  Objective:   Physical Exam   BP 132/84  Pulse 67  Temp(Src) 98.9 F (37.2 C) (Oral)  Wt 122 lb (55.339 kg)  SpO2 99% Wt Readings from Last 3 Encounters:  03/24/13 122 lb (55.339 kg)  09/29/12 120 lb (54.432 kg)  04/13/12 118 lb (53.524 kg)    General: Appears her stated age, well developed, well nourished in NAD. Skin: Warm, dry and intact. No rashes, lesions or ulcerations noted. HEENT: Head: normal shape and size, frontal sinuses  tender to palpation; Eyes: sclera white, no icterus, conjunctiva pink, PERRLA and EOMs intact; Ears: Tm's gray and intact, normal light reflex, + effusion bilaterally; Nose: mucosa pink and moist, septum midline; Throat/Mouth: Teeth present, mucosa pink and moist, no exudate, lesions or ulcerations noted.  Neck: Normal range of motion. Neck supple, trachea midline. No massses, lumps or thyromegaly present.  Cardiovascular: Normal rate and rhythm. S1,S2 noted.  No murmur, rubs or gallops noted. No JVD or BLE edema. No carotid bruits noted. Pulmonary/Chest: Normal effort and positive vesicular breath sounds. No respiratory distress. No wheezes, rales or ronchi noted.   BMET    Component Value Date/Time   NA 138 03/18/2012 1055   K 4.5 03/18/2012 1055   CL 102 03/18/2012  1055   CO2 28 03/18/2012 1055   GLUCOSE 92 03/18/2012 1055   BUN 16 03/18/2012 1055   CREATININE 0.8 03/18/2012 1055   CALCIUM 9.0 03/18/2012 1055    Lipid Panel     Component Value Date/Time   HDL 94 01/04/2009   LDLCALC 125 01/04/2009    CBC    Component Value Date/Time   WBC 4.5 03/18/2012 1055   RBC 3.69* 03/18/2012 1055   HGB 12.1 03/18/2012 1055   HCT 36.8 03/18/2012 1055   PLT 278.0 03/18/2012 1055   MCV 99.8 03/18/2012 1055   MCHC 32.8 03/18/2012 1055   RDW 13.9 03/18/2012 1055   LYMPHSABS 1.6 03/18/2012 1055   MONOABS 0.3 03/18/2012 1055   EOSABS 0.1 03/18/2012 1055   BASOSABS 0.0 03/18/2012 1055    Hgb A1C No results found for this basename: HGBA1C        Assessment & Plan:   Eustachian tube dysfunction bilaterally with acute sinusitis:  Take Sudafed OTC Continue zyrtec and albuterol erx for azithromax (pt requested this specifically) Tylenol or ibuprofen as needed for pain Left ear irrigation by CMA for cerumen impaction  RTC as needed

## 2013-03-24 NOTE — Progress Notes (Signed)
Pre-visit discussion using our clinic review tool. No additional management support is needed unless otherwise documented below in the visit note.  

## 2013-03-24 NOTE — Patient Instructions (Signed)

## 2013-04-19 ENCOUNTER — Other Ambulatory Visit: Payer: Self-pay | Admitting: Family Medicine

## 2013-04-28 ENCOUNTER — Telehealth: Payer: Self-pay | Admitting: Family Medicine

## 2013-04-28 NOTE — Telephone Encounter (Signed)
Pt wants to schedule her CPE w/pap, which is due this month.  However, your first CPE apptmt is not until 07/11/2013.  If it's ok w/you pt would like a sooner apptmt and says it's ok to schedule w/[name].  Do you want to accommodate her an apptmt this month or would you prefer me to check Regina's schedule? Thank you.

## 2013-04-28 NOTE — Telephone Encounter (Signed)
I have a lot of physicals.  Lets go ahead and schedule with Rene Kocheregina. Thanks!

## 2013-04-28 NOTE — Telephone Encounter (Signed)
[facility]

## 2013-04-30 ENCOUNTER — Encounter: Payer: Self-pay | Admitting: Internal Medicine

## 2013-04-30 ENCOUNTER — Ambulatory Visit (INDEPENDENT_AMBULATORY_CARE_PROVIDER_SITE_OTHER): Payer: BC Managed Care – PPO | Admitting: Internal Medicine

## 2013-04-30 VITALS — BP 106/74 | HR 76 | Temp 98.2°F | Ht 62.0 in | Wt 124.5 lb

## 2013-04-30 DIAGNOSIS — R635 Abnormal weight gain: Secondary | ICD-10-CM

## 2013-04-30 DIAGNOSIS — Z0000 Encounter for general adult medical examination without abnormal findings: Secondary | ICD-10-CM

## 2013-04-30 LAB — COMPREHENSIVE METABOLIC PANEL
ALBUMIN: 4.3 g/dL (ref 3.5–5.2)
ALT: 13 U/L (ref 0–35)
AST: 20 U/L (ref 0–37)
Alkaline Phosphatase: 73 U/L (ref 39–117)
BUN: 15 mg/dL (ref 6–23)
CHLORIDE: 101 meq/L (ref 96–112)
CO2: 29 meq/L (ref 19–32)
Calcium: 9.4 mg/dL (ref 8.4–10.5)
Creatinine, Ser: 0.8 mg/dL (ref 0.4–1.2)
GFR: 81.66 mL/min (ref >60.00)
GLUCOSE: 92 mg/dL (ref 70–99)
POTASSIUM: 4.3 meq/L (ref 3.5–5.1)
SODIUM: 138 meq/L (ref 135–145)
TOTAL PROTEIN: 7.4 g/dL (ref 6.0–8.3)
Total Bilirubin: 0.7 mg/dL (ref 0.3–1.2)

## 2013-04-30 LAB — CBC
HEMATOCRIT: 36.3 % (ref 36.0–46.0)
Hemoglobin: 12.3 g/dL (ref 12.0–15.0)
MCHC: 33.8 g/dL (ref 30.0–36.0)
MCV: 97.3 fl (ref 78.0–100.0)
Platelets: 275 10*3/uL (ref 150.0–400.0)
RBC: 3.73 Mil/uL — AB (ref 3.87–5.11)
RDW: 13.4 % (ref 11.5–14.6)
WBC: 5.2 10*3/uL (ref 4.5–10.5)

## 2013-04-30 LAB — LIPID PANEL
CHOLESTEROL: 246 mg/dL — AB (ref 0–200)
HDL: 120.3 mg/dL (ref >39.00)
Total CHOL/HDL Ratio: 2
Triglycerides: 37 mg/dL (ref 0.0–149.0)
VLDL: 7.4 mg/dL (ref 0.0–40.0)

## 2013-04-30 LAB — TSH: TSH: 1.03 u[IU]/mL (ref 0.35–5.50)

## 2013-04-30 LAB — LDL CHOLESTEROL, DIRECT: Direct LDL: 112.5 mg/dL

## 2013-04-30 NOTE — Progress Notes (Signed)
Subjective:    Patient ID: Tricia Munoz, female    DOB: September 21, 1960, 53 y.o.   MRN: [id number]  HPI  Pt presents to the clinic today for her annual exam. She does still have some concerns today about "fullness" in her left ear. She was seen last month for the same. She denies ear pain, fever, or difficulty hearing out of that ear.  Flu: 01/2013 Tetanus: 2010 Zostovax: 2014 Pap Smear: 04/2012 Mammogram: 04/2012 Colon Screening: never Bone Density: more than 2 years ago Eye Doctor: yearly Dentist: biannually  Review of Systems      Past Medical History  Diagnosis Date  . OA (osteoarthritis)   . Osteopenia   . Hypertension     Current Outpatient Prescriptions  Medication Sig Dispense Refill  . albuterol (PROVENTIL HFA;VENTOLIN HFA) 108 (90 BASE) MCG/ACT inhaler Inhale 2 puffs into the lungs every 6 (six) hours as needed.      Marland Kitchen azithromycin (ZITHROMAX) 250 MG tablet Take 2 tablets today, then 1 tablet daily x 4 days  6 each  0  . benazepril-hydrochlorthiazide (LOTENSIN HCT) 10-12.5 MG per tablet take 1 tablet by mouth once daily  30 tablet  5  . cetirizine (ZYRTEC) 10 MG tablet Take 10 mg by mouth daily.      . cholecalciferol (VITAMIN D) 1000 UNITS tablet Take 1,000 Units by mouth daily.       . fluticasone (FLONASE) 50 MCG/ACT nasal spray Place 2 sprays into the nose daily.      Marland Kitchen venlafaxine XR (EFFEXOR-XR) 150 MG 24 hr capsule take 1 capsule by mouth daily  30 capsule  2   No current facility-administered medications for this visit.    No Known Allergies  Family History  Problem Relation Age of Onset  . Dementia Mother   . Hypertension Father   . Kidney disease Sister     History   Social History  . Marital Status: Married    Spouse Name: N/A    Number of Children: 1  . Years of Education: N/A   Occupational History  . self employeed    Social History Main Topics  . Smoking status: Never Smoker   . Smokeless tobacco: Never Used  . Alcohol Use: Yes       Comment: Occasional  . Drug Use: No  . Sexual Activity: Not on file   Other Topics Concern  . Not on file   Social History Narrative   Lives in [place] with husband. 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      Walks regularly     Constitutional: Pt reports weight gain. Denies fever, malaise, fatigue, headache.  HEENT: Denies eye pain, eye redness, ear pain, ringing in the ears, wax buildup, runny nose, nasal congestion, bloody nose, or sore throat. Respiratory: Denies difficulty breathing, shortness of breath, cough or sputum production.   Cardiovascular: Denies chest pain, chest tightness, palpitations or swelling in the hands or feet.  Gastrointestinal: Denies abdominal pain, bloating, constipation, diarrhea or blood in the stool.  GU: Denies urgency, frequency, pain with urination, burning sensation, blood in urine, odor or discharge. Musculoskeletal: Pt reports joint pain in hands. Denies decrease in range of motion, difficulty with gait, muscle pain.  Skin: Denies redness, rashes, lesions or ulcercations.  Neurological: Denies dizziness, difficulty with memory, difficulty with speech or problems with balance and coordination.   No other specific complaints in a complete review of systems (except as listed in  HPI above).  Objective:   Physical Exam   BP 106/74  Pulse 76  Temp(Src) 98.2 F (36.8 C) (Oral)  Ht 5\' 2"  (1.575 m)  Wt 124 lb 8 oz (56.473 kg)  BMI 22.77 kg/m2  SpO2 98%  LMP 11/06/2012 Wt Readings from Last 3 Encounters:  04/30/13 124 lb 8 oz (56.473 kg)  03/24/13 122 lb (55.339 kg)  09/29/12 120 lb (54.432 kg)    General: Appears her stated age, well developed, well nourished in NAD. Skin: Warm, dry and intact. No rashes, lesions or ulcerations noted. HEENT: Head: normal shape and size; Eyes: sclera white, no icterus, conjunctiva pink, PERRLA and EOMs intact; Ears: Cerumen impaction on left, normal TM on right; Nose:  mucosa pink and moist, septum midline; Throat/Mouth: Teeth present, mucosa pink and moist, no exudate, lesions or ulcerations noted.  Neck: Normal range of motion. Neck supple, trachea midline. No massses, lumps or thyromegaly present.  Cardiovascular: Normal rate and rhythm. S1,S2 noted.  No murmur, rubs or gallops noted. No JVD or BLE edema. No carotid bruits noted. Pulmonary/Chest: Normal effort and positive vesicular breath sounds. No respiratory distress. No wheezes, rales or ronchi noted.  Abdomen: Soft and nontender. Normal bowel sounds, no bruits noted. No distention or masses noted. Liver, spleen and kidneys non palpable. Musculoskeletal: Normal range of motion. Herbdens nodes noted bilaterally. No difficulty with gait.  Neurological: Alert and oriented. Cranial nerves II-XII intact. Coordination normal. +DTRs bilaterally. Psychiatric: Mood and affect normal. Behavior is normal. Judgment and thought content normal.     BMET    Component Value Date/Time   NA 138 03/18/2012 1055   K 4.5 03/18/2012 1055   CL 102 03/18/2012 1055   CO2 28 03/18/2012 1055   GLUCOSE 92 03/18/2012 1055   BUN 16 03/18/2012 1055   CREATININE 0.8 03/18/2012 1055   CALCIUM 9.0 03/18/2012 1055    Lipid Panel     Component Value Date/Time   HDL 94 01/04/2009   LDLCALC 125 01/04/2009    CBC    Component Value Date/Time   WBC 4.5 03/18/2012 1055   RBC 3.69* 03/18/2012 1055   HGB 12.1 03/18/2012 1055   HCT 36.8 03/18/2012 1055   PLT 278.0 03/18/2012 1055   MCV 99.8 03/18/2012 1055   MCHC 32.8 03/18/2012 1055   RDW 13.9 03/18/2012 1055   LYMPHSABS 1.6 03/18/2012 1055   MONOABS 0.3 03/18/2012 1055   EOSABS 0.1 03/18/2012 1055   BASOSABS 0.0 03/18/2012 1055    Hgb A1C No results found for this basename: HGBA1C         Assessment & Plan:   Preventative Health Maintenance:  She will go ahead and schedule her mammogram Pap smear done last year, will repeat in 2016 She agrees to the FOBT testing yearly- we  will get her the card today She declines setting up her bone density, she prefers to do it in 2016 Screening lab work today  Weight gain:  I advised her this is likely r/t menopause and a decrease in her metabolism Her diet is very healthy already I advised her to cut back on the alcohol and increase her cardio exercises to a more frequent routine RTC in 1 year or sooner if needed

## 2013-04-30 NOTE — Patient Instructions (Signed)

## 2013-04-30 NOTE — Progress Notes (Signed)
Pre-visit discussion using our clinic review tool. No additional management support is needed unless otherwise documented below in the visit note.  

## 2013-05-17 ENCOUNTER — Telehealth: Payer: Self-pay

## 2013-05-17 MED ORDER — VENLAFAXINE HCL ER 150 MG PO CP24: 150.0000 mg | ORAL_CAPSULE | Freq: Every day | ORAL | Status: DC

## 2013-05-17 NOTE — Telephone Encounter (Signed)
What medication

## 2013-05-17 NOTE — Telephone Encounter (Signed)
Rx sent through e-[facility]  

## 2013-05-17 NOTE — Addendum Note (Signed)
Addended by: Roena MaladyEVONTENNO, Monalisa Bayless Y on: 05/17/2013 03:33 PM   Modules accepted: Orders

## 2013-05-17 NOTE — Telephone Encounter (Signed)
Last filled 04/21/13--had an annual exam with you in 04/30/2013--please advise

## 2013-05-17 NOTE — Telephone Encounter (Signed)
Effexor

## 2013-05-17 NOTE — Telephone Encounter (Signed)
Ok to refill 

## 2013-05-24 ENCOUNTER — Encounter: Payer: Self-pay | Admitting: Family Medicine

## 2013-05-24 ENCOUNTER — Ambulatory Visit (INDEPENDENT_AMBULATORY_CARE_PROVIDER_SITE_OTHER): Payer: BC Managed Care – PPO | Admitting: Family Medicine

## 2013-05-24 VITALS — BP 130/82 | HR 72 | Temp 98.1°F | Ht 61.0 in | Wt 122.5 lb

## 2013-05-24 DIAGNOSIS — J069 Acute upper respiratory infection, unspecified: Secondary | ICD-10-CM

## 2013-05-24 MED ORDER — AZITHROMYCIN 250 MG PO TABS: ORAL_TABLET | ORAL | Status: DC

## 2013-05-24 NOTE — Progress Notes (Signed)
SUBJECTIVE:  Tricia Munoz is a 53 y.o. female who complains of coryza, congestion, sneezing, sore throat and ear pain for 10 days. She denies a history of anorexia, chest pain, chills, dizziness, fatigue, myalgias, nausea and shortness of breath and denies a history of asthma. Patient denies smoke cigarettes.   Patient Active Problem List   Diagnosis Date Noted  . HTN (hypertension) 03/18/2012  . OSTEOARTHRITIS 02/17/2009  . POLYARTHRITIS 02/17/2009  . OSTEOPENIA 02/17/2009   Past Medical History  Diagnosis Date  . OA (osteoarthritis)   . Osteopenia   . Hypertension    No past surgical history on file. History  Substance Use Topics  . Smoking status: Never Smoker   . Smokeless tobacco: Never Used  . Alcohol Use: Yes     Comment: Occasional   Family History  Problem Relation Age of Onset  . Dementia Mother   . Hypertension Father   . Kidney disease Sister    No Known Allergies Current Outpatient Prescriptions on File Prior to Visit  Medication Sig Dispense Refill  . albuterol (PROVENTIL HFA;VENTOLIN HFA) 108 (90 BASE) MCG/ACT inhaler Inhale 2 puffs into the lungs every 6 (six) hours as needed.      . benazepril-hydrochlorthiazide (LOTENSIN HCT) 10-12.5 MG per tablet take 1 tablet by mouth once daily  30 tablet  5  . cetirizine (ZYRTEC) 10 MG tablet Take 10 mg by mouth daily.      Marland Kitchen. venlafaxine XR (EFFEXOR-XR) 150 MG 24 hr capsule Take 1 capsule (150 mg total) by mouth daily.  30 capsule  2   No current facility-administered medications on file prior to visit.   The PMH, PSH, Social History, Family History, Medications, and [facility], and have been updated if relevant.  OBJECTIVE: BP 130/82  Pulse 72  Temp(Src) 98.1 F (36.7 C) (Oral)  Ht 5\' 1"  (1.549 m)  Wt 122 lb 8 oz (55.566 kg)  BMI 23.16 kg/m2  SpO2 96%  LMP 11/06/2012  She appears well, vital signs are as noted. Right TM erythematous, dull, left TM normal.  Throat and pharynx  normal.  Neck supple. No adenopathy in the neck. Nose is congested. Sinuses non tender. The chest is clear, without wheezes or rales.  ASSESSMENT:  otitis media  PLAN: zpack Symptomatic therapy suggested: push fluids, rest and return office visit prn if symptoms persist or worsen.  Call or return to clinic prn if these symptoms worsen or fail to improve as anticipated.

## 2013-05-24 NOTE — Patient Instructions (Signed)
Great to see you. You have an ear infection. Please take zpack as directed.  Ibuprofen as needed for pain and or fever.

## 2013-05-24 NOTE — Progress Notes (Signed)
Pre-visit discussion using our clinic review tool. No additional management support is needed unless otherwise documented below in the visit note.  

## 2013-08-12 ENCOUNTER — Other Ambulatory Visit: Payer: Self-pay | Admitting: *Deleted

## 2013-08-12 MED ORDER — VENLAFAXINE HCL ER 150 MG PO CP24: 150.0000 mg | ORAL_CAPSULE | Freq: Every day | ORAL | Status: DC

## 2013-10-04 ENCOUNTER — Other Ambulatory Visit: Payer: Self-pay | Admitting: Family Medicine

## 2013-11-02 ENCOUNTER — Other Ambulatory Visit: Payer: Self-pay | Admitting: Family Medicine

## 2013-11-09 ENCOUNTER — Other Ambulatory Visit: Payer: Self-pay | Admitting: Family Medicine

## 2013-12-09 ENCOUNTER — Encounter: Payer: Self-pay | Admitting: Family Medicine

## 2013-12-09 ENCOUNTER — Ambulatory Visit (INDEPENDENT_AMBULATORY_CARE_PROVIDER_SITE_OTHER): Payer: BC Managed Care – PPO | Admitting: Family Medicine

## 2013-12-09 VITALS — BP 122/70 | HR 80 | Temp 97.9°F | Wt 122.8 lb

## 2013-12-09 DIAGNOSIS — F32A Depression, unspecified: Secondary | ICD-10-CM

## 2013-12-09 DIAGNOSIS — F329 Major depressive disorder, single episode, unspecified: Secondary | ICD-10-CM

## 2013-12-09 DIAGNOSIS — Z23 Encounter for immunization: Secondary | ICD-10-CM

## 2013-12-09 DIAGNOSIS — I10 Essential (primary) hypertension: Secondary | ICD-10-CM

## 2013-12-09 DIAGNOSIS — F325 Major depressive disorder, single episode, in full remission: Secondary | ICD-10-CM | POA: Insufficient documentation

## 2013-12-09 DIAGNOSIS — F3289 Other specified depressive episodes: Secondary | ICD-10-CM

## 2013-12-09 MED ORDER — BENAZEPRIL-HYDROCHLOROTHIAZIDE 10-12.5 MG PO TABS: ORAL_TABLET | ORAL | Status: DC

## 2013-12-09 MED ORDER — VENLAFAXINE HCL ER 150 MG PO CP24: ORAL_CAPSULE | ORAL | Status: DC

## 2013-12-09 NOTE — Progress Notes (Signed)
Pre visit review using our clinic review tool, if applicable. No additional management support is needed unless otherwise documented below in the visit note. 

## 2013-12-09 NOTE — Assessment & Plan Note (Signed)
Well controlled. >15 minutes spent in face to face time with patient, >50% spent in counselling or coordination of care. Discussed weaning off of Effexor.  She wants to think about this after her daughter wedding and will let me know.

## 2013-12-09 NOTE — Patient Instructions (Signed)
Great to see you. Please call me if you decide to wean off of Effexor.

## 2013-12-09 NOTE — Assessment & Plan Note (Signed)
Stable on current rx. No changes made. 

## 2013-12-09 NOTE — Progress Notes (Signed)
   Subjective:   Patient ID: Tricia Munoz, female    DOB: [birth year], 53 y.o.   MRN: [id number]  Tricia Munoz is a pleasant 53 y.o. year old female who presents to clinic today with Follow-up  on 12/09/2013  HPI: HTN- on lotensin HCT 10-12.5 mg daily. Denies any HA, blurred vision, CP, SOB or LE edema. Lab Results  Component Value Date   CREATININE 0.8 04/30/2013    Depression- has been stable on Effexor 150 mg daily. Denies any symptoms of anxiety or depression. Has been on this dose for 2 years.  Started it when she stopped taking OCPs and had emotional liability.  Prior to this, had no history of anxiety or depression.   Current Outpatient Prescriptions on File Prior to Visit  Medication Sig Dispense Refill  . albuterol (PROVENTIL HFA;VENTOLIN HFA) 108 (90 BASE) MCG/ACT inhaler Inhale 2 puffs into the lungs every 6 (six) hours as needed.      . cetirizine (ZYRTEC) 10 MG tablet Take 10 mg by mouth daily.       No current facility-administered medications on file prior to visit.    No Known Allergies  Past Medical History  Diagnosis Date  . OA (osteoarthritis)   . Osteopenia   . Hypertension     No past surgical history on file.  Family History  Problem Relation Age of Onset  . Dementia Mother   . Hypertension Father   . Kidney disease Sister     History   Social History  . Marital Status: Married    Spouse Name: N/A    Number of Children: 1  . Years of Education: N/A   Occupational History  . self employeed    Social History Main Topics  . Smoking status: Never Smoker   . Smokeless tobacco: Never Used  . Alcohol Use: Yes     Comment: Occasional  . Drug Use: No  . Sexual Activity: Not on file   Other Topics Concern  . Not on file   Social History Narrative   Lives in [place] with husband. 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 55 year old daughter [place].      Walks regularly   The PMH, PSH, Social History, Family History, Medications,  and allergies have been reviewed in [facility], and have been updated if relevant.  Review of Systems See HPI No SI or HI Appetite good- weight stable Wt Readings from Last 3 Encounters:  12/09/13 122 lb 12 oz (55.679 kg)  05/24/13 122 lb 8 oz (55.566 kg)  04/30/13 124 lb 8 oz (56.473 kg)   Sleeping ok     Objective:    BP 122/70  Pulse 80  Temp(Src) 97.9 F (36.6 C) (Oral)  Wt 122 lb 12 oz (55.679 kg)  SpO2 98%  LMP 11/06/2012   Physical Exam  Nursing note and vitals reviewed. Constitutional: She appears well-developed and well-nourished. No distress.  Skin: Skin is warm and dry.  Psychiatric: She has a normal mood and affect. Her behavior is normal. Judgment and thought content normal.          Assessment & Plan:   Essential hypertension No Follow-up on file.

## 2014-03-25 ENCOUNTER — Encounter: Payer: Self-pay | Admitting: Internal Medicine

## 2014-03-25 ENCOUNTER — Ambulatory Visit (INDEPENDENT_AMBULATORY_CARE_PROVIDER_SITE_OTHER): Payer: BC Managed Care – PPO | Admitting: Internal Medicine

## 2014-03-25 VITALS — BP 116/68 | HR 74 | Temp 98.1°F | Wt 120.0 lb

## 2014-03-25 DIAGNOSIS — J069 Acute upper respiratory infection, unspecified: Secondary | ICD-10-CM

## 2014-03-25 MED ORDER — AZITHROMYCIN 250 MG PO TABS: ORAL_TABLET | ORAL | Status: DC

## 2014-03-25 NOTE — Patient Instructions (Signed)
Upper Respiratory Infection, Adult An upper respiratory infection (URI) is also sometimes known as the common cold. The upper respiratory tract includes the nose, sinuses, throat, trachea, and bronchi. Bronchi are the airways leading to the lungs. Most people improve within 1 week, but symptoms can last up to 2 weeks. A residual cough may last even longer.  CAUSES Many different viruses can infect the tissues lining the upper respiratory tract. The tissues become irritated and inflamed and often become very moist. Mucus production is also common. A cold is contagious. You can easily spread the virus to others by oral contact. This includes kissing, sharing a glass, coughing, or sneezing. Touching your mouth or nose and then touching a surface, which is then touched by another person, can also spread the virus. SYMPTOMS  Symptoms typically develop 1 to 3 days after you come in contact with a cold virus. Symptoms vary from person to person. They may include:  Runny nose.  Sneezing.  Nasal congestion.  Sinus irritation.  Sore throat.  Loss of voice (laryngitis).  Cough.  Fatigue.  Muscle aches.  Loss of appetite.  Headache.  Low-grade fever. DIAGNOSIS  You might diagnose your own cold based on familiar symptoms, since most people get a cold 2 to 3 times a year. Your caregiver can confirm this based on your exam. Most importantly, your caregiver can check that your symptoms are not due to another disease such as strep throat, sinusitis, pneumonia, asthma, or epiglottitis. Blood tests, throat tests, and X-rays are not necessary to diagnose a common cold, but they may sometimes be helpful in excluding other more serious diseases. Your caregiver will decide if any further tests are required. RISKS AND COMPLICATIONS  You may be at risk for a more severe case of the common cold if you smoke cigarettes, have chronic heart disease (such as heart failure) or lung disease (such as asthma), or if  you have a weakened immune system. The very young and very old are also at risk for more serious infections. Bacterial sinusitis, middle ear infections, and bacterial pneumonia can complicate the common cold. The common cold can worsen asthma and chronic obstructive pulmonary disease (COPD). Sometimes, these complications can require emergency medical care and may be life-threatening. PREVENTION  The best way to protect against getting a cold is to practice good hygiene. Avoid oral or hand contact with people with cold symptoms. Wash your hands often if contact occurs. There is no clear evidence that vitamin C, vitamin E, echinacea, or exercise reduces the chance of developing a cold. However, it is always recommended to get plenty of rest and practice good nutrition. TREATMENT  Treatment is directed at relieving symptoms. There is no cure. Antibiotics are not effective, because the infection is caused by a virus, not by bacteria. Treatment may include:  Increased fluid intake. Sports drinks offer valuable electrolytes, sugars, and fluids.  Breathing heated mist or steam (vaporizer or shower).  Eating chicken soup or other clear broths, and maintaining good nutrition.  Getting plenty of rest.  Using gargles or lozenges for comfort.  Controlling fevers with ibuprofen or acetaminophen as directed by your caregiver.  Increasing usage of your inhaler if you have asthma. Zinc gel and zinc lozenges, taken in the first 24 hours of the common cold, can shorten the duration and lessen the severity of symptoms. Pain medicines may help with fever, muscle aches, and throat pain. A variety of non-prescription medicines are available to treat congestion and runny nose. Your caregiver   can make recommendations and may suggest nasal or lung inhalers for other symptoms.  HOME CARE INSTRUCTIONS   Only take over-the-counter or prescription medicines for pain, discomfort, or fever as directed by your  caregiver.  Use a warm mist humidifier or inhale steam from a shower to increase air moisture. This may keep secretions moist and make it easier to breathe.  Drink enough water and fluids to keep your urine clear or pale yellow.  Rest as needed.  Return to work when your temperature has returned to normal or as your caregiver advises. You may need to stay home longer to avoid infecting others. You can also use a face mask and careful hand washing to prevent spread of the virus. SEEK MEDICAL CARE IF:   After the first few days, you feel you are getting worse rather than better.  You need your caregiver's advice about medicines to control symptoms.  You develop chills, worsening shortness of breath, or brown or red sputum. These may be signs of pneumonia.  You develop yellow or brown nasal discharge or pain in the face, especially when you bend forward. These may be signs of sinusitis.  You develop a fever, swollen neck glands, pain with swallowing, or white areas in the back of your throat. These may be signs of strep throat. SEEK IMMEDIATE MEDICAL CARE IF:   You have a fever.  You develop severe or persistent headache, ear pain, sinus pain, or chest pain.  You develop wheezing, a prolonged cough, cough up blood, or have a change in your usual mucus (if you have chronic lung disease).  You develop sore muscles or a stiff neck. Document Released: 09/25/2000 Document Revised: 06/24/2011 Document Reviewed: 07/07/2013 Exit[employer] Patient Information 2015 [employer]. This information is not intended to replace advice given to you by your health care provider. Make sure you discuss any questions you have with your health care provider.  

## 2014-03-25 NOTE — Progress Notes (Signed)
HPI  Pt presents to the clinic today with c/o headache, ear pain and cough. She reports this started 2 weeks ago. The cough is non productive. Her right ear hurts worse than her left ear. She has run low grade fevers. She has tried Tylenol, Advil Sinus and Sudafed without much relief. She does have a history of allergies, and takes zyrtec/flonase for that. She has not had sick contacts that she is aware of.   Review of Systems      Past Medical History  Diagnosis Date  . OA (osteoarthritis)   . Osteopenia   . Hypertension     Family History  Problem Relation Age of Onset  . Dementia Mother   . Hypertension Father   . Kidney disease Sister     History   Social History  . Marital Status: Married    Spouse Name: N/A    Number of Children: 1  . Years of Education: N/A   Occupational History  . self employeed    Social History Main Topics  . Smoking status: Never Smoker   . Smokeless tobacco: Never Used  . Alcohol Use: Yes     Comment: Occasional  . Drug Use: No  . Sexual Activity: Not on file   Other Topics Concern  . Not on file   Social History Narrative   Lives in [place] with husband. 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 671 year old daughter [place].      Walks regularly    No Known Allergies   Constitutional: Positive headache fatigue and fever.  HEENT:  Positive ear pain. Denies eye redness, eye pain, pressure behind the eyes, facial pain, nasal congestion, ringing in the ears, wax buildup, runny nose or sore throat Respiratory: Positive cough. Denies difficulty breathing or shortness of breath.  Cardiovascular: Denies chest pain, chest tightness, palpitations or swelling in the hands or feet.   No other specific complaints in a complete review of systems (except as listed in HPI above).  Objective:   BP 116/68 mmHg  Pulse 74  Temp(Src) 98.1 F (36.7 C) (Oral)  Wt 120 lb (54.432 kg)  SpO2 98%  LMP 11/06/2012  Wt Readings from Last 3  Encounters:  03/25/14 120 lb (54.432 kg)  12/09/13 122 lb 12 oz (55.679 kg)  05/24/13 122 lb 8 oz (55.566 kg)     General: Appears her stated age in NAD. HEENT: Head: normal shape and size, no sinus tenderness noted; Ears: Tm's gray and intact, normal light reflex; Nose: mucosa pink and moist, septum midline; Throat/Mouth: Teeth present, mucosa erythematous and moist, no exudate noted, no lesions or ulcerations noted. Cervical adenopathy noted. Cardiovascular: Normal rate and rhythm. S1,S2 noted.  No murmur, rubs or gallops noted.  Pulmonary/Chest: Normal effort and positive vesicular breath sounds. No respiratory distress. No wheezes, rales or ronchi noted.      Assessment & Plan:   Upper Respiratory Infection:  Get some rest and drink plenty of water Continue zyrtec/flonase eRx for Azithromax x 5 days Delsym OTC for cough  RTC as needed or if symptoms persist.

## 2014-03-25 NOTE — Progress Notes (Signed)
Pre visit review using our clinic review tool, if applicable. No additional management support is needed unless otherwise documented below in the visit note. 

## 2014-03-28 ENCOUNTER — Other Ambulatory Visit: Payer: Self-pay

## 2014-03-28 DIAGNOSIS — Z1231 Encounter for screening mammogram for malignant neoplasm of breast: Secondary | ICD-10-CM

## 2014-03-31 ENCOUNTER — Other Ambulatory Visit: Payer: Self-pay | Admitting: Family Medicine

## 2014-03-31 ENCOUNTER — Other Ambulatory Visit: Payer: Self-pay

## 2014-03-31 ENCOUNTER — Ambulatory Visit
Admission: RE | Admit: 2014-03-31 | Discharge: 2014-03-31 | Disposition: A | Discharge status: Home / Self Care | Payer: BC Managed Care – PPO | Source: Ambulatory Visit

## 2014-03-31 DIAGNOSIS — Z1231 Encounter for screening mammogram for malignant neoplasm of breast: Secondary | ICD-10-CM

## 2014-05-17 ENCOUNTER — Other Ambulatory Visit: Payer: Self-pay | Admitting: Family Medicine

## 2014-06-24 ENCOUNTER — Other Ambulatory Visit: Payer: Self-pay | Admitting: Family Medicine

## 2014-06-24 DIAGNOSIS — Z01419 Encounter for gynecological examination (general) (routine) without abnormal findings: Secondary | ICD-10-CM

## 2014-06-24 DIAGNOSIS — I10 Essential (primary) hypertension: Secondary | ICD-10-CM

## 2014-06-30 ENCOUNTER — Other Ambulatory Visit (INDEPENDENT_AMBULATORY_CARE_PROVIDER_SITE_OTHER): Payer: 59

## 2014-06-30 DIAGNOSIS — Z01419 Encounter for gynecological examination (general) (routine) without abnormal findings: Secondary | ICD-10-CM

## 2014-06-30 DIAGNOSIS — Z0000 Encounter for general adult medical examination without abnormal findings: Secondary | ICD-10-CM

## 2014-06-30 LAB — CBC WITH DIFFERENTIAL/PLATELET
BASOS ABS: 0.1 10*3/uL (ref 0.0–0.1)
Basophils Relative: 1 % (ref 0.0–3.0)
Eosinophils Absolute: 0 10*3/uL (ref 0.0–0.7)
Eosinophils Relative: 0.7 % (ref 0.0–5.0)
HCT: 38 % (ref 36.0–46.0)
Hemoglobin: 12.8 g/dL (ref 12.0–15.0)
LYMPHS ABS: 1.4 10*3/uL (ref 0.7–4.0)
Lymphocytes Relative: 26 % (ref 12.0–46.0)
MCHC: 33.7 g/dL (ref 30.0–36.0)
MCV: 94.3 fl (ref 78.0–100.0)
MONO ABS: 0.4 10*3/uL (ref 0.1–1.0)
Monocytes Relative: 7.8 % (ref 3.0–12.0)
NEUTROS PCT: 64.5 % (ref 43.0–77.0)
Neutro Abs: 3.3 10*3/uL (ref 1.4–7.7)
Platelets: 266 10*3/uL (ref 150.0–400.0)
RBC: 4.03 Mil/uL (ref 3.87–5.11)
RDW: 13.6 % (ref 11.5–15.5)
WBC: 5.2 10*3/uL (ref 4.0–10.5)

## 2014-06-30 LAB — COMPREHENSIVE METABOLIC PANEL
ALT: 16 U/L (ref 0–35)
AST: 19 U/L (ref 0–37)
Albumin: 4.4 g/dL (ref 3.5–5.2)
Alkaline Phosphatase: 102 U/L (ref 39–117)
BILIRUBIN TOTAL: 0.4 mg/dL (ref 0.2–1.2)
BUN: 14 mg/dL (ref 6–23)
CO2: 31 mEq/L (ref 19–32)
Calcium: 9.5 mg/dL (ref 8.4–10.5)
Chloride: 102 mEq/L (ref 96–112)
Creatinine, Ser: 0.82 mg/dL (ref 0.40–1.20)
GFR: 76.75 mL/min (ref >60.00)
GLUCOSE: 88 mg/dL (ref 70–99)
POTASSIUM: 4.1 meq/L (ref 3.5–5.1)
Sodium: 137 mEq/L (ref 135–145)
TOTAL PROTEIN: 7.2 g/dL (ref 6.0–8.3)

## 2014-06-30 LAB — LIPID PANEL
Cholesterol: 243 mg/dL — ABNORMAL HIGH (ref 0–200)
HDL: 79.2 mg/dL (ref >39.00)
LDL Cholesterol: 150 mg/dL — ABNORMAL HIGH (ref 0–99)
NONHDL: 163.8
TRIGLYCERIDES: 69 mg/dL (ref 0.0–149.0)
Total CHOL/HDL Ratio: 3
VLDL: 13.8 mg/dL (ref 0.0–40.0)

## 2014-06-30 LAB — TSH: TSH: 1.31 u[IU]/mL (ref 0.35–4.50)

## 2014-07-05 ENCOUNTER — Encounter: Payer: Self-pay | Admitting: Family Medicine

## 2014-07-05 ENCOUNTER — Ambulatory Visit (INDEPENDENT_AMBULATORY_CARE_PROVIDER_SITE_OTHER): Payer: 59 | Admitting: Family Medicine

## 2014-07-05 ENCOUNTER — Other Ambulatory Visit (HOSPITAL_COMMUNITY)
Admission: RE | Admit: 2014-07-05 | Discharge: 2014-07-05 | Disposition: A | Discharge status: Home / Self Care | Payer: 59 | Source: Ambulatory Visit | Attending: Family Medicine | Admitting: Family Medicine

## 2014-07-05 VITALS — BP 102/72 | HR 78 | Temp 98.0°F | Ht 61.0 in | Wt 120.8 lb

## 2014-07-05 DIAGNOSIS — I10 Essential (primary) hypertension: Secondary | ICD-10-CM | POA: Diagnosis not present

## 2014-07-05 DIAGNOSIS — Z01419 Encounter for gynecological examination (general) (routine) without abnormal findings: Secondary | ICD-10-CM | POA: Diagnosis not present

## 2014-07-05 DIAGNOSIS — Z0000 Encounter for general adult medical examination without abnormal findings: Secondary | ICD-10-CM | POA: Diagnosis not present

## 2014-07-05 DIAGNOSIS — E785 Hyperlipidemia, unspecified: Secondary | ICD-10-CM | POA: Diagnosis not present

## 2014-07-05 DIAGNOSIS — Z1151 Encounter for screening for human papillomavirus (HPV): Secondary | ICD-10-CM | POA: Diagnosis present

## 2014-07-05 DIAGNOSIS — Z113 Encounter for screening for infections with a predominantly sexual mode of transmission: Secondary | ICD-10-CM | POA: Diagnosis present

## 2014-07-05 DIAGNOSIS — N760 Acute vaginitis: Secondary | ICD-10-CM | POA: Insufficient documentation

## 2014-07-05 DIAGNOSIS — F329 Major depressive disorder, single episode, unspecified: Secondary | ICD-10-CM

## 2014-07-05 DIAGNOSIS — Z1211 Encounter for screening for malignant neoplasm of colon: Secondary | ICD-10-CM

## 2014-07-05 DIAGNOSIS — F32A Depression, unspecified: Secondary | ICD-10-CM

## 2014-07-05 MED ORDER — AZELASTINE HCL 0.05 % OP SOLN: 1.0000 [drp] | Freq: Two times a day (BID) | OPHTHALMIC | Status: DC

## 2014-07-05 MED ORDER — VENLAFAXINE HCL ER 150 MG PO CP24: 150.0000 mg | ORAL_CAPSULE | Freq: Every day | ORAL | Status: DC

## 2014-07-05 MED ORDER — SIMVASTATIN 10 MG PO TABS: 10.0000 mg | ORAL_TABLET | Freq: Every day | ORAL | Status: DC

## 2014-07-05 NOTE — Progress Notes (Signed)
Pre visit review using our clinic review tool, if applicable. No additional management support is needed unless otherwise documented below in the visit note. 

## 2014-07-05 NOTE — Progress Notes (Signed)
Subjective:   Patient ID: Tricia Munoz, female    DOB: 1960/11/12, 54 y.o.   MRN: [id number]  Tricia Munoz is a pleasant 54 y.o. year old female who presents to clinic today with Annual Exam  on 07/05/2014  HPI:  Zoster 01/13/13 Td 02/17/09 Influenza vaccine 12/09/13 Pap smear 04/2012 Mammogram 03/31/14 Colon- never- refusing but willing to do stool cards. LMP two years ago- no post menopausal bleeding.  HLD-  New diagnosis.  LDL was 112 last year, now 150.  Does have a strong FH of HLD.  Eats a very healthy diet.  Does not eat out or eat friend foods.  Sister and parents were both on cholesterol medications. Lab Results  Component Value Date   CHOL 243* 06/30/2014   HDL 79.20 06/30/2014   LDLCALC 150* 06/30/2014   LDLDIRECT 112.5 04/30/2013   TRIG 69.0 06/30/2014   CHOLHDL 3 06/30/2014   Lab Results  Component Value Date   CREATININE 0.82 06/30/2014   Lab Results  Component Value Date   WBC 5.2 06/30/2014   HGB 12.8 06/30/2014   HCT 38.0 06/30/2014   MCV 94.3 06/30/2014   PLT 266.0 06/30/2014   Lab Results  Component Value Date   TSH 1.31 06/30/2014    HTN- Feels her BP has been well controlled on Lotensin HCT.  Denies any dizziness, blurred vision, CP or SOB.  Mom died two months ago but she feels she is coping ok.  She is tired because she drives back to [place] to help her dad out quite often.  She feels Effexor is working well at current dose.  Current Outpatient Prescriptions on File Prior to Visit  Medication Sig Dispense Refill  . albuterol (PROVENTIL HFA;VENTOLIN HFA) 108 (90 BASE) MCG/ACT inhaler Inhale 2 puffs into the lungs every 6 (six) hours as needed.    . benazepril-hydrochlorthiazide (LOTENSIN HCT) 10-12.5 MG per tablet take 1 tablet by mouth once daily 90 tablet 1  . BLACK COHOSH PO Take 1 capsule by mouth daily.    . cetirizine (ZYRTEC) 10 MG tablet Take 10 mg by mouth daily.    . fluticasone (FLONASE) 50 MCG/ACT nasal spray Place 2  sprays into both nostrils daily.     No current facility-administered medications on file prior to visit.    No Known Allergies  Past Medical History  Diagnosis Date  . OA (osteoarthritis)   . Osteopenia   . Hypertension     No past surgical history on file.  Family History  Problem Relation Age of Onset  . Dementia Mother   . Hypertension Father   . Kidney disease Sister     History   Social History  . Marital Status: Married    Spouse Name: N/A  . Number of Children: 1  . Years of Education: N/A   Occupational History  . self employeed    Social History Main Topics  . Smoking status: Never Smoker   . Smokeless tobacco: Never Used  . Alcohol Use: Yes     Comment: Occasional  . Drug Use: No  . Sexual Activity: Not on file   Other Topics Concern  . Not on file   Social History Narrative   Lives in [place] with husband. 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      Walks regularly   The PMH, PSH, Social History, Family History, Medications, and allergies have been reviewed in [facility], and have been updated if  relevant.    Review of Systems  Constitutional: Negative.   HENT: Negative.   Eyes: Negative.   Respiratory: Negative.   Cardiovascular: Negative.   Gastrointestinal: Negative.   Endocrine: Negative.   Genitourinary: Negative.   Musculoskeletal: Negative.   Skin: Negative.   Allergic/Immunologic: Negative.   Neurological: Negative.   Hematological: Negative.   Psychiatric/Behavioral: Negative.   All other systems reviewed and are negative.      Objective:    BP 102/72 mmHg  Pulse 78  Temp(Src) 98 F (36.7 C) (Oral)  Ht 5\' 1"  (1.549 m)  Wt 120 lb 12 oz (54.772 kg)  BMI 22.83 kg/m2  SpO2 97%  LMP 11/06/2012   Physical Exam   General:  Well-developed,well-nourished,in no acute distress; alert,appropriate and cooperative throughout examination Head:  normocephalic and atraumatic.   Eyes:  vision grossly  intact, pupils equal, pupils round, and pupils reactive to light.   Ears:  R ear normal and L ear normal.   Nose:  no external deformity.   Mouth:  good dentition.   Neck:  No deformities, masses, or tenderness noted. Breasts:  No mass, nodules, thickening, tenderness, bulging, retraction, inflamation, nipple discharge or skin changes noted.   Lungs:  Normal respiratory effort, chest expands symmetrically. Lungs are clear to auscultation, no crackles or wheezes. Heart:  Normal rate and regular rhythm. S1 and S2 normal without gallop, murmur, click, rub or other extra sounds. Abdomen:  Bowel sounds positive,abdomen soft and non-tender without masses, organomegaly or hernias noted. Rectal:  no external abnormalities.   Genitalia:  Pelvic Exam:        External: normal female genitalia without lesions or masses        Vagina: normal without lesions or masses        Cervix: normal without lesions or masses        Adnexa: normal bimanual exam without masses or fullness        Uterus: normal by palpation        Pap smear: performed Msk:  No deformity or scoliosis noted of thoracic or lumbar spine.   Extremities:  No clubbing, cyanosis, edema, or deformity noted with normal full range of motion of all joints.   Neurologic:  alert & oriented X3 and gait normal.   Skin:  Intact without suspicious lesions or rashes Cervical Nodes:  No lymphadenopathy noted Axillary Nodes:  No palpable lymphadenopathy Psych:  Cognition and judgment appear intact. Alert and cooperative with normal attention span and concentration. No apparent delusions, illusions, hallucinations       Assessment & Plan:   Essential hypertension  HLD (hyperlipidemia)  Special screening for malignant neoplasms, colon - Plan: Fecal occult blood, imunochemical  Well woman exam with routine gynecological exam - Plan: Cytology - PAP [place] No Follow-up on file.

## 2014-07-05 NOTE — Assessment & Plan Note (Signed)
Well controlled on current rx. No changes made today. 

## 2014-07-05 NOTE — Assessment & Plan Note (Signed)
New- discussed tx options. She feels that she cannot improve her diet. Start statin- discussed possible side effects/risks. eRx sent for Zocor 10 mg daily. Lipid panel and CMET in 8 weeks. The patient indicates understanding of these issues and agrees with the plan.

## 2014-07-05 NOTE — Addendum Note (Signed)
Addended by: Alvina ChouWALSH, Somnang Mahan J on: 07/05/2014 02:57 PM   Modules accepted: Orders

## 2014-07-05 NOTE — Assessment & Plan Note (Signed)
Reviewed preventive care protocols, scheduled due services, and updated immunizations Discussed nutrition, exercise, diet, and healthy lifestyle.  Pap smear done today. 

## 2014-07-05 NOTE — Assessment & Plan Note (Signed)
Well controlled on current dose of Effexor. Coping with mother's death at this time.

## 2014-07-05 NOTE — Patient Instructions (Addendum)
Good to see you. I am so sorry for your loss. Please stop by the lab to pick up your stool cards.  We are starting a very low dose of zocor. Please come back in 8 weeks for lab work.

## 2014-07-06 ENCOUNTER — Telehealth: Payer: Self-pay | Admitting: Family Medicine

## 2014-07-06 LAB — CYTOLOGY - PAP

## 2014-07-06 NOTE — Telephone Encounter (Signed)
emmi mailed  °

## 2014-07-07 ENCOUNTER — Encounter: Payer: Self-pay | Admitting: *Deleted

## 2014-07-08 LAB — CERVICOVAGINAL ANCILLARY ONLY: CANDIDA VAGINITIS: NEGATIVE

## 2014-07-11 LAB — CERVICOVAGINAL ANCILLARY ONLY: HERPES (WINDOWPATH): NEGATIVE

## 2014-07-15 ENCOUNTER — Encounter: Payer: Self-pay | Admitting: *Deleted

## 2014-07-15 ENCOUNTER — Other Ambulatory Visit (INDEPENDENT_AMBULATORY_CARE_PROVIDER_SITE_OTHER): Payer: 59

## 2014-07-15 DIAGNOSIS — Z1211 Encounter for screening for malignant neoplasm of colon: Secondary | ICD-10-CM | POA: Diagnosis not present

## 2014-07-15 LAB — FECAL OCCULT BLOOD, IMMUNOCHEMICAL: Fecal Occult Bld: NEGATIVE

## 2014-08-26 ENCOUNTER — Other Ambulatory Visit: Payer: Self-pay | Admitting: Family Medicine

## 2014-08-31 ENCOUNTER — Other Ambulatory Visit: Payer: Self-pay | Admitting: Family Medicine

## 2014-08-31 DIAGNOSIS — E785 Hyperlipidemia, unspecified: Secondary | ICD-10-CM

## 2014-09-02 ENCOUNTER — Other Ambulatory Visit (INDEPENDENT_AMBULATORY_CARE_PROVIDER_SITE_OTHER): Payer: 59

## 2014-09-02 DIAGNOSIS — E785 Hyperlipidemia, unspecified: Secondary | ICD-10-CM

## 2014-09-02 LAB — COMPREHENSIVE METABOLIC PANEL
ALBUMIN: 4.1 g/dL (ref 3.5–5.2)
ALT: 14 U/L (ref 0–35)
AST: 17 U/L (ref 0–37)
Alkaline Phosphatase: 102 U/L (ref 39–117)
BILIRUBIN TOTAL: 0.4 mg/dL (ref 0.2–1.2)
BUN: 13 mg/dL (ref 6–23)
CO2: 31 mEq/L (ref 19–32)
Calcium: 9.5 mg/dL (ref 8.4–10.5)
Chloride: 104 mEq/L (ref 96–112)
Creatinine, Ser: 0.73 mg/dL (ref 0.40–1.20)
GFR: 87.71 mL/min (ref >60.00)
GLUCOSE: 89 mg/dL (ref 70–99)
POTASSIUM: 4.7 meq/L (ref 3.5–5.1)
Sodium: 138 mEq/L (ref 135–145)
Total Protein: 7 g/dL (ref 6.0–8.3)

## 2014-09-02 LAB — LIPID PANEL
Cholesterol: 199 mg/dL (ref 0–200)
HDL: 82.7 mg/dL (ref >39.00)
LDL Cholesterol: 103 mg/dL — ABNORMAL HIGH (ref 0–99)
NONHDL: 116.3
Total CHOL/HDL Ratio: 2
Triglycerides: 66 mg/dL (ref 0.0–149.0)
VLDL: 13.2 mg/dL (ref 0.0–40.0)

## 2014-09-06 ENCOUNTER — Encounter: Payer: Self-pay | Admitting: Family Medicine

## 2014-09-27 ENCOUNTER — Ambulatory Visit (INDEPENDENT_AMBULATORY_CARE_PROVIDER_SITE_OTHER): Payer: 59 | Admitting: Family Medicine

## 2014-09-27 ENCOUNTER — Encounter: Payer: Self-pay | Admitting: Family Medicine

## 2014-09-27 VITALS — BP 108/68 | HR 73 | Temp 98.2°F | Wt 121.5 lb

## 2014-09-27 DIAGNOSIS — J340 Abscess, furuncle and carbuncle of nose: Secondary | ICD-10-CM | POA: Diagnosis not present

## 2014-09-27 MED ORDER — MUPIROCIN CALCIUM 2 % EX CREA: 1.0000 "application " | TOPICAL_CREAM | Freq: Two times a day (BID) | CUTANEOUS | Status: DC

## 2014-09-27 MED ORDER — FLUCONAZOLE 150 MG PO TABS
150.0000 mg | ORAL_TABLET | Freq: Once | ORAL | Status: AC
Start: 2014-09-27 — End: 2014-09-28

## 2014-09-27 MED ORDER — SULFAMETHOXAZOLE-TRIMETHOPRIM 800-160 MG PO TABS: 1.0000 | ORAL_TABLET | Freq: Two times a day (BID) | ORAL | Status: DC

## 2014-09-27 NOTE — Assessment & Plan Note (Signed)
New- persistent. Treat with oral bactrim ds twice daily x 7- 10 days for ?MRSA. If no improvement, refer to derm as SCC or other malignancy cannot be ruled out. The patient indicates understanding of these issues and agrees with the plan.

## 2014-09-27 NOTE — Progress Notes (Signed)
Subjective:   Patient ID: Tricia Munoz, female    DOB: 1960-05-01, 54 y.o.   MRN: [id number]  Tricia Munoz is a pleasant 54 y.o. year old female who presents to clinic today with nose pain  on 09/27/2014  HPI:  ?bump in her nose- started a couple of months ago, right nare. Has been growing in size and becoming more painful. Stopped nasal spray as she thought it may have caused this but that did not help.  Has bled a couple of times when she blows her nose.  Tender and warm to touch now.  Current Outpatient Prescriptions on File Prior to Visit  Medication Sig Dispense Refill  . albuterol (PROVENTIL HFA;VENTOLIN HFA) 108 (90 BASE) MCG/ACT inhaler Inhale 2 puffs into the lungs every 6 (six) hours as needed.    Marland Kitchen azelastine (OPTIVAR) 0.05 % ophthalmic solution Apply 1 drop to eye 2 (two) times daily. 6 mL 12  . benazepril-hydrochlorthiazide (LOTENSIN HCT) 10-12.5 MG per tablet TAKE 1 TABLET BY MOUTH ONCE DAILY 90 tablet 3  . BLACK COHOSH PO Take 1 capsule by mouth daily.    . cetirizine (ZYRTEC) 10 MG tablet Take 10 mg by mouth daily.    . fluticasone (FLONASE) 50 MCG/ACT nasal spray Place 2 sprays into both nostrils daily.    . simvastatin (ZOCOR) 10 MG tablet Take 1 tablet (10 mg total) by mouth at bedtime. 30 tablet 3  . venlafaxine XR (EFFEXOR-XR) 150 MG 24 hr capsule TAKE 1 CAPSULE BY MOUTH ONCE DAILY 90 capsule 3   No current facility-administered medications on file prior to visit.    No Known Allergies  Past Medical History  Diagnosis Date  . OA (osteoarthritis)   . Osteopenia   . Hypertension     No past surgical history on file.  Family History  Problem Relation Age of Onset  . Dementia Mother   . Hypertension Father   . Kidney disease Sister     History   Social History  . Marital Status: Married    Spouse Name: N/A  . Number of Children: 1  . Years of Education: N/A   Occupational History  . self employeed    Social History Main Topics  .  Smoking status: Never Smoker   . Smokeless tobacco: Never Used  . Alcohol Use: Yes     Comment: Occasional  . Drug Use: No  . Sexual Activity: Not on file   Other Topics Concern  . Not on file   Social History Narrative   Lives in [place] with husband. 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      Walks regularly   The PMH, PSH, Social History, Family History, Medications, and allergies have been reviewed in [facility], and have been updated if relevant.   Review of Systems  Constitutional: Negative.   HENT: Negative for congestion, dental problem, drooling, ear discharge, ear pain, facial swelling, hearing loss, mouth sores, nosebleeds, postnasal drip, rhinorrhea, sinus pressure, sneezing, sore throat, tinnitus, trouble swallowing and voice change.   Respiratory: Negative.   Cardiovascular: Negative.   All other systems reviewed and are negative.      Objective:    BP 108/68 mmHg  Pulse 73  Temp(Src) 98.2 F (36.8 C) (Oral)  Wt 121 lb 8 oz (55.112 kg)  SpO2 96%  LMP 11/06/2012   Physical Exam  Constitutional: She is oriented to person, place, and time. She appears well-developed and well-nourished. No  distress.  HENT:  Head: Normocephalic and atraumatic.  Nose:    Cardiovascular: Normal rate.   Pulmonary/Chest: Effort normal.  Musculoskeletal: Normal range of motion.  Neurological: She is alert and oriented to person, place, and time. No cranial nerve deficit.  Skin: Skin is warm and dry.  Psychiatric: She has a normal mood and affect. Her behavior is normal. Judgment and thought content normal.  Nursing note and vitals reviewed.         Assessment & Plan:   Nasal abscess No Follow-up on file.

## 2014-09-27 NOTE — Patient Instructions (Signed)
Good to see you. Please take Bactrim as directed- 1 tablet twice daily for 7- 10 days. Bactroban as needed topically.  Please keep me updated.

## 2014-09-27 NOTE — Progress Notes (Signed)
Pre visit review using our clinic review tool, if applicable. No additional management support is needed unless otherwise documented below in the visit note. 

## 2014-10-24 ENCOUNTER — Other Ambulatory Visit: Payer: Self-pay | Admitting: Family Medicine

## 2015-02-23 ENCOUNTER — Other Ambulatory Visit: Payer: Self-pay | Admitting: *Deleted

## 2015-02-23 MED ORDER — VENLAFAXINE HCL ER 150 MG PO CP24: 150.0000 mg | ORAL_CAPSULE | Freq: Every day | ORAL | Status: DC

## 2015-02-23 MED ORDER — BENAZEPRIL-HYDROCHLOROTHIAZIDE 10-12.5 MG PO TABS: 1.0000 | ORAL_TABLET | Freq: Every day | ORAL | Status: DC

## 2015-02-23 NOTE — Telephone Encounter (Signed)
Pt switched pharmacy

## 2015-03-15 ENCOUNTER — Ambulatory Visit (INDEPENDENT_AMBULATORY_CARE_PROVIDER_SITE_OTHER): Payer: 59

## 2015-03-15 DIAGNOSIS — Z23 Encounter for immunization: Secondary | ICD-10-CM | POA: Diagnosis not present

## 2015-03-21 ENCOUNTER — Encounter: Payer: Self-pay | Admitting: Family Medicine

## 2015-03-21 ENCOUNTER — Ambulatory Visit (INDEPENDENT_AMBULATORY_CARE_PROVIDER_SITE_OTHER): Payer: 59 | Admitting: Family Medicine

## 2015-03-21 VITALS — BP 110/62 | HR 94 | Temp 98.0°F | Wt 124.0 lb

## 2015-03-21 DIAGNOSIS — F4323 Adjustment disorder with mixed anxiety and depressed mood: Secondary | ICD-10-CM

## 2015-03-21 DIAGNOSIS — F988 Other specified behavioral and emotional disorders with onset usually occurring in childhood and adolescence: Secondary | ICD-10-CM

## 2015-03-21 DIAGNOSIS — L989 Disorder of the skin and subcutaneous tissue, unspecified: Secondary | ICD-10-CM | POA: Insufficient documentation

## 2015-03-21 DIAGNOSIS — F909 Attention-deficit hyperactivity disorder, unspecified type: Secondary | ICD-10-CM | POA: Diagnosis not present

## 2015-03-21 MED ORDER — BUPROPION HCL ER (XL) 150 MG PO TB24: 150.0000 mg | ORAL_TABLET | Freq: Every day | ORAL | Status: DC

## 2015-03-21 MED ORDER — DOXYCYCLINE HYCLATE 100 MG PO TABS: 100.0000 mg | ORAL_TABLET | Freq: Two times a day (BID) | ORAL | Status: DC

## 2015-03-21 NOTE — Assessment & Plan Note (Signed)
New- cystic acne with possible staph infection. 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 May need low dose doxycyline in future.  Advised to discard current make up.   Call or return to clinic prn if these symptoms worsen or fail to improve as anticipated. The patient indicates understanding of these issues and agrees with the plan.

## 2015-03-21 NOTE — Assessment & Plan Note (Signed)
New- >25 minutes spent in face to face time with patient, >50% spent in counselling or coordination of care Declining psychotherapy- "ive done that." Add wellbutrin 150 mg XL daily to effexor to hopefully help with anxiety, depression and ADD symptoms. Call or return to clinic prn if these symptoms worsen or fail to improve as anticipated. The patient indicates understanding of these issues and agrees with the plan.

## 2015-03-21 NOTE — Progress Notes (Signed)
Subjective:   Patient ID: Tricia Munoz, female    DOB: 05/18/1960, 54 y.o.   MRN: [id number]  Tricia Munoz is a pleasant 54 y.o. year old female who presents to clinic today with Acne  on 03/21/2015  HPI:  Facial lesions- has noticed them intermittently on her face for several months.  "come to a head" like acne, can be painful.  They are puss filled. Sometimes surrounded by erythema and warmth.  Does not have a  H/o acne in past.  No changes in soaps, detergents or make up.  Anxiety- currently taking Effexor 150 mg daily.  Also was formally diagnosed with ADD years ago.  Since her mom died and [place] to help care for her dad, anxiety symptoms have worsened.  Also feels more depressed- more tearful, having a harder time focusing.  No SI or HI. Sleeping ok. Appetite good. Wt Readings from Last 3 Encounters:  03/21/15 124 lb (56.246 kg)  09/27/14 121 lb 8 oz (55.112 kg)  07/05/14 120 lb 12 oz (54.772 kg)   Current Outpatient Prescriptions on File Prior to Visit  Medication Sig Dispense Refill  . albuterol (PROVENTIL HFA;VENTOLIN HFA) 108 (90 BASE) MCG/ACT inhaler Inhale 2 puffs into the lungs every 6 (six) hours as needed.    Marland Kitchen. azelastine (OPTIVAR) 0.05 % ophthalmic solution Apply 1 drop to eye 2 (two) times daily. 6 mL 12  . benazepril-hydrochlorthiazide (LOTENSIN HCT) 10-12.5 MG tablet Take 1 tablet by mouth daily. 90 tablet 0  . BLACK COHOSH PO Take 1 capsule by mouth daily.    . cetirizine (ZYRTEC) 10 MG tablet Take 10 mg by mouth daily.    . fluticasone (FLONASE) 50 MCG/ACT nasal spray Place 2 sprays into both nostrils daily.    . mupirocin cream (BACTROBAN) 2 % Apply 1 application topically 2 (two) times daily. 15 g 0  . simvastatin (ZOCOR) 10 MG tablet TAKE 1 TABLET BY MOUTH AT BEDTIME 30 tablet 11  . venlafaxine XR (EFFEXOR-XR) 150 MG 24 hr capsule Take 1 capsule (150 mg total) by mouth daily. 90 capsule 0   No current  facility-administered medications on file prior to visit.    No Known Allergies  Past Medical History  Diagnosis Date  . OA (osteoarthritis)   . Osteopenia   . Hypertension     No past surgical history on file.  Family History  Problem Relation Age of Onset  . Dementia Mother   . Hypertension Father   . Kidney disease Sister     Social History   Social History  . Marital Status: Married    Spouse Name: N/A  . Number of Children: 1  . Years of Education: N/A   Occupational History  . self employeed    Social History Main Topics  . Smoking status: Never Smoker   . Smokeless tobacco: Never Used  . Alcohol Use: Yes     Comment: Occasional  . Drug Use: No  . Sexual Activity: Not on file   Other Topics Concern  . Not on file   Social History Narrative   Lives in [place] with husband. 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 56 year old daughter [place].      Walks regularly   The PMH, PSH, Social History, Family History, Medications, and allergies have been reviewed in [facility], and have been updated if relevant.   Review of Systems  Constitutional: Negative.   HENT: Negative.   Respiratory:  Negative.   Cardiovascular: Negative.   Gastrointestinal: Negative.   Musculoskeletal: Negative.   Skin: Positive for rash.  Neurological: Negative.   Psychiatric/Behavioral: Positive for dysphoric mood and decreased concentration. Negative for suicidal ideas, hallucinations, behavioral problems, confusion, sleep disturbance, self-injury and agitation. The patient is nervous/anxious. The patient is not hyperactive.   All other systems reviewed and are negative.      Objective:    BP 110/62 mmHg  Pulse 94  Temp(Src) 98 F (36.7 C) (Oral)  Wt 124 lb (56.246 kg)  SpO2 97%  LMP 11/06/2012   Physical Exam  Constitutional: She is oriented to person, place, and time. She appears well-developed and well-nourished. No distress.  HENT:  Head: Normocephalic.  Eyes:  Conjunctivae are normal.  Cardiovascular: Normal rate.   Pulmonary/Chest: Effort normal.  Musculoskeletal: Normal range of motion.  Neurological: She is alert and oriented to person, place, and time. No cranial nerve deficit.  Skin:  Erythematous, cystic lesions on cheeks bilaterally, chin, non draining with some surrounding erythema  Psychiatric: She has a normal mood and affect. Her behavior is normal. Judgment and thought content normal.  Nursing note and vitals reviewed.         Assessment & Plan:   Facial skin lesion  Adjustment disorder with mixed anxiety and depressed mood  ADD (attention deficit disorder) No Follow-up on file.

## 2015-03-21 NOTE — Progress Notes (Signed)
Pre visit review using our clinic review tool, if applicable. No additional management support is needed unless otherwise documented below in the visit note. 

## 2015-04-12 ENCOUNTER — Ambulatory Visit: Payer: 59 | Admitting: Internal Medicine

## 2015-06-12 ENCOUNTER — Other Ambulatory Visit: Payer: Self-pay | Admitting: Family Medicine

## 2015-06-15 ENCOUNTER — Ambulatory Visit (INDEPENDENT_AMBULATORY_CARE_PROVIDER_SITE_OTHER): Payer: BLUE CROSS/BLUE SHIELD | Admitting: Family Medicine

## 2015-06-15 ENCOUNTER — Encounter: Payer: Self-pay | Admitting: Family Medicine

## 2015-06-15 VITALS — BP 114/80 | HR 76 | Temp 97.9°F | Wt 122.0 lb

## 2015-06-15 DIAGNOSIS — R300 Dysuria: Secondary | ICD-10-CM | POA: Diagnosis not present

## 2015-06-15 LAB — POC URINALSYSI DIPSTICK (AUTOMATED)
BILIRUBIN UA: NEGATIVE
Blood, UA: NEGATIVE
Glucose, UA: NEGATIVE
KETONES UA: NEGATIVE
Leukocytes, UA: NEGATIVE
Nitrite, UA: NEGATIVE
PH UA: 6.5
PROTEIN UA: NEGATIVE
SPEC GRAV UA: 1.01
Urobilinogen, UA: 0.2

## 2015-06-15 MED ORDER — CIPROFLOXACIN HCL 500 MG PO TABS: 500.0000 mg | ORAL_TABLET | Freq: Two times a day (BID) | ORAL | Status: DC

## 2015-06-15 NOTE — Addendum Note (Signed)
Addended by: Desmond Dike on: 06/15/2015 12:13 PM   Modules accepted: Orders

## 2015-06-15 NOTE — Progress Notes (Signed)
SUBJECTIVE: Tricia Munoz is a 55 y.o. female who complains of urinary frequency, urgency and dysuria x 2 days, without flank pain, fever, chills, or abnormal vaginal discharge or bleeding.   Current Outpatient Prescriptions on File Prior to Visit  Medication Sig Dispense Refill  . albuterol (PROVENTIL HFA;VENTOLIN HFA) 108 (90 BASE) MCG/ACT inhaler Inhale 2 puffs into the lungs every 6 (six) hours as needed.    Marland Kitchen azelastine (OPTIVAR) 0.05 % ophthalmic solution Apply 1 drop to eye 2 (two) times daily. 6 mL 12  . benazepril-hydrochlorthiazide (LOTENSIN HCT) 10-12.5 MG tablet Take 1 tablet by mouth daily. OFFICE VISIT REQUIRED FOR ADDITIONAL REFILLS 30 tablet 0  . BLACK COHOSH PO Take 1 capsule by mouth daily.    Marland Kitchen buPROPion (WELLBUTRIN XL) 150 MG 24 hr tablet Take 1 tablet (150 mg total) by mouth daily. 30 tablet 3  . cetirizine (ZYRTEC) 10 MG tablet Take 10 mg by mouth daily.    Marland Kitchen doxycycline (VIBRA-TABS) 100 MG tablet Take 1 tablet (100 mg total) by mouth 2 (two) times daily. 20 tablet 0  . fluticasone (FLONASE) 50 MCG/ACT nasal spray Place 2 sprays into both nostrils daily.    . mupirocin cream (BACTROBAN) 2 % Apply 1 application topically 2 (two) times daily. 15 g 0  . simvastatin (ZOCOR) 10 MG tablet TAKE 1 TABLET BY MOUTH AT BEDTIME 30 tablet 11  . venlafaxine XR (EFFEXOR-XR) 150 MG 24 hr capsule TAKE ONE CAPSULE BY MOUTH DAILY 30 capsule 0   No current facility-administered medications on file prior to visit.    No Known Allergies  Past Medical History  Diagnosis Date  . OA (osteoarthritis)   . Osteopenia   . Hypertension     No past surgical history on file.  Family History  Problem Relation Age of Onset  . Dementia Mother   . Hypertension Father   . Kidney disease Sister     Social History   Social History  . Marital Status: Married    Spouse Name: N/A  . Number of Children: 1  . Years of Education: N/A   Occupational History  . self employeed    Social  History Main Topics  . Smoking status: Never Smoker   . Smokeless tobacco: Never Used  . Alcohol Use: Yes     Comment: Occasional  . Drug Use: No  . Sexual Activity: Not on file   Other Topics Concern  . Not on file   Social History Narrative   Lives in [place] with husband. 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 58 year old daughter [place].      Walks regularly   The PMH, PSH, Social History, Family History, Medications, and allergies have been reviewed in [facility], and have been updated if relevant.  OBJECTIVE: BP 114/80 mmHg  Pulse 76  Temp(Src) 97.9 F (36.6 C) (Oral)  Wt 122 lb (55.339 kg)  SpO2 99%  LMP 11/06/2012   Appears well, in no apparent distress.  Vital signs are normal. The abdomen is soft without tenderness, guarding, mass, rebound or organomegaly. No CVA tenderness or inguinal adenopathy noted. Urine dipstick shows negative for all components.   ASSESSMENT: UTI uncomplicated without evidence of pyelonephritis, UA neg but classic symptoms of cystitis.  PLAN: Treatment per orders - cipro 500 mg twice daily x 3 days, also push fluids, may use Pyridium OTC prn. Call or return to clinic prn if these symptoms worsen or fail to improve as anticipated.

## 2015-06-15 NOTE — Patient Instructions (Signed)

## 2015-06-15 NOTE — Progress Notes (Signed)
Pre visit review using our clinic review tool, if applicable. No additional management support is needed unless otherwise documented below in the visit note. 

## 2015-07-11 ENCOUNTER — Other Ambulatory Visit: Payer: Self-pay | Admitting: Family Medicine

## 2015-07-11 DIAGNOSIS — Z01419 Encounter for gynecological examination (general) (routine) without abnormal findings: Secondary | ICD-10-CM

## 2015-07-11 DIAGNOSIS — E785 Hyperlipidemia, unspecified: Secondary | ICD-10-CM

## 2015-07-11 DIAGNOSIS — I10 Essential (primary) hypertension: Secondary | ICD-10-CM

## 2015-07-12 ENCOUNTER — Other Ambulatory Visit: Payer: Self-pay | Admitting: Family Medicine

## 2015-07-12 ENCOUNTER — Other Ambulatory Visit (INDEPENDENT_AMBULATORY_CARE_PROVIDER_SITE_OTHER): Payer: BLUE CROSS/BLUE SHIELD

## 2015-07-12 DIAGNOSIS — Z0000 Encounter for general adult medical examination without abnormal findings: Secondary | ICD-10-CM

## 2015-07-12 DIAGNOSIS — Z01419 Encounter for gynecological examination (general) (routine) without abnormal findings: Secondary | ICD-10-CM

## 2015-07-12 LAB — TSH: TSH: 1.17 u[IU]/mL (ref 0.35–4.50)

## 2015-07-12 LAB — COMPREHENSIVE METABOLIC PANEL
ALBUMIN: 4.1 g/dL (ref 3.5–5.2)
ALT: 11 U/L (ref 0–35)
AST: 15 U/L (ref 0–37)
Alkaline Phosphatase: 85 U/L (ref 39–117)
BUN: 15 mg/dL (ref 6–23)
CALCIUM: 9.4 mg/dL (ref 8.4–10.5)
CHLORIDE: 104 meq/L (ref 96–112)
CO2: 30 mEq/L (ref 19–32)
CREATININE: 0.9 mg/dL (ref 0.40–1.20)
GFR: 68.67 mL/min (ref >60.00)
Glucose, Bld: 95 mg/dL (ref 70–99)
POTASSIUM: 4.3 meq/L (ref 3.5–5.1)
SODIUM: 139 meq/L (ref 135–145)
TOTAL PROTEIN: 7.1 g/dL (ref 6.0–8.3)
Total Bilirubin: 0.4 mg/dL (ref 0.2–1.2)

## 2015-07-12 LAB — CBC WITH DIFFERENTIAL/PLATELET
BASOS ABS: 0 10*3/uL (ref 0.0–0.1)
Basophils Relative: 0.8 % (ref 0.0–3.0)
EOS ABS: 0.1 10*3/uL (ref 0.0–0.7)
Eosinophils Relative: 1.7 % (ref 0.0–5.0)
HCT: 32.3 % — ABNORMAL LOW (ref 36.0–46.0)
Hemoglobin: 10.7 g/dL — ABNORMAL LOW (ref 12.0–15.0)
LYMPHS ABS: 1.3 10*3/uL (ref 0.7–4.0)
Lymphocytes Relative: 31.5 % (ref 12.0–46.0)
MCHC: 33.2 g/dL (ref 30.0–36.0)
MCV: 91.6 fl (ref 78.0–100.0)
MONO ABS: 0.5 10*3/uL (ref 0.1–1.0)
Monocytes Relative: 10.9 % (ref 3.0–12.0)
NEUTROS PCT: 55.1 % (ref 43.0–77.0)
Neutro Abs: 2.3 10*3/uL (ref 1.4–7.7)
Platelets: 276 10*3/uL (ref 150.0–400.0)
RBC: 3.52 Mil/uL — AB (ref 3.87–5.11)
RDW: 14.5 % (ref 11.5–15.5)
WBC: 4.3 10*3/uL (ref 4.0–10.5)

## 2015-07-12 LAB — LIPID PANEL
CHOLESTEROL: 211 mg/dL — AB (ref 0–200)
HDL: 89.7 mg/dL (ref >39.00)
LDL CALC: 112 mg/dL — AB (ref 0–99)
NonHDL: 121.04
TRIGLYCERIDES: 46 mg/dL (ref 0.0–149.0)
Total CHOL/HDL Ratio: 2
VLDL: 9.2 mg/dL (ref 0.0–40.0)

## 2015-07-18 ENCOUNTER — Ambulatory Visit (INDEPENDENT_AMBULATORY_CARE_PROVIDER_SITE_OTHER): Payer: BLUE CROSS/BLUE SHIELD | Admitting: Family Medicine

## 2015-07-18 ENCOUNTER — Encounter: Payer: Self-pay | Admitting: Family Medicine

## 2015-07-18 VITALS — BP 110/68 | HR 79 | Temp 98.0°F | Ht 61.25 in | Wt 120.5 lb

## 2015-07-18 DIAGNOSIS — Z0000 Encounter for general adult medical examination without abnormal findings: Secondary | ICD-10-CM | POA: Diagnosis not present

## 2015-07-18 DIAGNOSIS — F909 Attention-deficit hyperactivity disorder, unspecified type: Secondary | ICD-10-CM | POA: Diagnosis not present

## 2015-07-18 DIAGNOSIS — E785 Hyperlipidemia, unspecified: Secondary | ICD-10-CM | POA: Diagnosis not present

## 2015-07-18 DIAGNOSIS — F988 Other specified behavioral and emotional disorders with onset usually occurring in childhood and adolescence: Secondary | ICD-10-CM

## 2015-07-18 DIAGNOSIS — F329 Major depressive disorder, single episode, unspecified: Secondary | ICD-10-CM

## 2015-07-18 DIAGNOSIS — F4323 Adjustment disorder with mixed anxiety and depressed mood: Secondary | ICD-10-CM | POA: Diagnosis not present

## 2015-07-18 DIAGNOSIS — Z01419 Encounter for gynecological examination (general) (routine) without abnormal findings: Secondary | ICD-10-CM

## 2015-07-18 DIAGNOSIS — D649 Anemia, unspecified: Secondary | ICD-10-CM

## 2015-07-18 DIAGNOSIS — I10 Essential (primary) hypertension: Secondary | ICD-10-CM

## 2015-07-18 DIAGNOSIS — F32A Depression, unspecified: Secondary | ICD-10-CM

## 2015-07-18 MED ORDER — BUPROPION HCL ER (XL) 150 MG PO TB24: 150.0000 mg | ORAL_TABLET | Freq: Every day | ORAL | Status: DC

## 2015-07-18 MED ORDER — BENAZEPRIL-HYDROCHLOROTHIAZIDE 10-12.5 MG PO TABS: ORAL_TABLET | ORAL | Status: DC

## 2015-07-18 NOTE — Assessment & Plan Note (Signed)
Well controlled on current rxs. No changes made today. 

## 2015-07-18 NOTE — Assessment & Plan Note (Signed)
Reviewed preventive care protocols, scheduled due services, and updated immunizations Discussed nutrition, exercise, diet, and healthy lifestyle.  IFOB She will schedule mammogram.

## 2015-07-18 NOTE — Assessment & Plan Note (Signed)
New- ? Secondary to given blood. Will recheck CBC in 1 month along with ferritin. Also completing IFOB.

## 2015-07-18 NOTE — Assessment & Plan Note (Signed)
Well controlled on low dose Zocor. No changes made to rx today.

## 2015-07-18 NOTE — Addendum Note (Signed)
Addended by: Alvina ChouWALSH, Johnathon Mittal J on: 07/18/2015 02:50 PM   Modules accepted: Orders

## 2015-07-18 NOTE — Progress Notes (Signed)
Subjective:   Patient ID: Tricia Munoz, female    DOB: Mar 18, 1961, 55 y.o.   MRN: [id number]  Tricia Munoz is a pleasant 55 y.o. year old female who presents to clinic today with Annual Exam  on 07/18/2015  HPI:  Zoster 01/13/13 Td 02/17/09 Influenza vaccine 03/15/15 Pap smear 07/05/14 Mammogram 03/31/14 Colon- never- refusing but willing to do stool cards- neg IFOB 07/15/14  LMP 3 years ago- no post menopausal bleeding.  Anemia- new onset- she did give blood just prior to this test.  She is O negative. Denies fatigue or blood in stool. Lab Results  Component Value Date   WBC 4.3 07/12/2015   HGB 10.7* 07/12/2015   HCT 32.3* 07/12/2015   MCV 91.6 07/12/2015   PLT 276.0 07/12/2015    HLD-  Much better control with low dose Zocor. Lab Results  Component Value Date   CHOL 211* 07/12/2015   HDL 89.70 07/12/2015   LDLCALC 112* 07/12/2015   LDLDIRECT 112.5 04/30/2013   TRIG 46.0 07/12/2015   CHOLHDL 2 07/12/2015   Lab Results  Component Value Date   CREATININE 0.90 07/12/2015   Lab Results  Component Value Date   WBC 4.3 07/12/2015   HGB 10.7* 07/12/2015   HCT 32.3* 07/12/2015   MCV 91.6 07/12/2015   PLT 276.0 07/12/2015   Lab Results  Component Value Date   TSH 1.17 07/12/2015    HTN- BP has been well controlled on Lotensin HCT.  Denies any dizziness, blurred vision, CP or SOB.  Anxiety- She feels Effexor is working well at current dose. Denies any symptoms of anxiety or depression.  Current Outpatient Prescriptions on File Prior to Visit  Medication Sig Dispense Refill  . albuterol (PROVENTIL HFA;VENTOLIN HFA) 108 (90 BASE) MCG/ACT inhaler Inhale 2 puffs into the lungs every 6 (six) hours as needed.    Marland Kitchen. azelastine (OPTIVAR) 0.05 % ophthalmic solution Apply 1 drop to eye 2 (two) times daily. 6 mL 12  . BLACK COHOSH PO Take 1 capsule by mouth daily.    . cetirizine (ZYRTEC) 10 MG tablet Take 10 mg by mouth daily.    . fluticasone (FLONASE) 50 MCG/ACT  nasal spray Place 2 sprays into both nostrils daily.    . mupirocin cream (BACTROBAN) 2 % Apply 1 application topically 2 (two) times daily. 15 g 0  . simvastatin (ZOCOR) 10 MG tablet TAKE 1 TABLET BY MOUTH AT BEDTIME 30 tablet 11  . venlafaxine XR (EFFEXOR-XR) 150 MG 24 hr capsule TAKE ONE CAPSULE BY MOUTH DAILY 30 capsule 0   No current facility-administered medications on file prior to visit.    No Known Allergies  Past Medical History  Diagnosis Date  . OA (osteoarthritis)   . Osteopenia   . Hypertension     No past surgical history on file.  Family History  Problem Relation Age of Onset  . Dementia Mother   . Hypertension Father   . Kidney disease Sister     Social History   Social History  . Marital Status: Married    Spouse Name: N/A  . Number of Children: 1  . Years of Education: N/A   Occupational History  . self employeed    Social History Main Topics  . Smoking status: Never Smoker   . Smokeless tobacco: Never Used  . Alcohol Use: Yes     Comment: Occasional  . Drug Use: No  . Sexual Activity: Not on file   Other Topics Concern  .  Not on file   Social History Narrative   Lives in [place] with husband. 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 16 year old daughter [place].      Walks regularly   The PMH, PSH, Social History, Family History, Medications, and allergies have been reviewed in [facility], and have been updated if relevant.    Review of Systems  Constitutional: Negative.   HENT: Negative.   Eyes: Negative.   Respiratory: Negative.   Cardiovascular: Negative.   Gastrointestinal: Negative.   Endocrine: Negative.   Genitourinary: Negative.   Musculoskeletal: Negative.   Skin: Negative.   Allergic/Immunologic: Negative.   Neurological: Negative.   Hematological: Negative.   Psychiatric/Behavioral: Negative.   All other systems reviewed and are negative.      Objective:    BP 110/68 mmHg  Pulse 79  Temp(Src) 98 F (36.7 C) (Oral)   Ht 5' 1.25" (1.556 m)  Wt 120 lb 8 oz (54.658 kg)  BMI 22.58 kg/m2  SpO2 97%  LMP 11/06/2012   Physical Exam   General:  Well-developed,well-nourished,in no acute distress; alert,appropriate and cooperative throughout examination Head:  normocephalic and atraumatic.   Eyes:  vision grossly intact, pupils equal, pupils round, and pupils reactive to light.   Ears:  R ear normal and L ear normal.   Nose:  no external deformity.   Mouth:  good dentition.   Neck:  No deformities, masses, or tenderness noted. Breasts:  No mass, nodules, thickening, tenderness, bulging, retraction, inflamation, nipple discharge or skin changes noted.   Lungs:  Normal respiratory effort, chest expands symmetrically. Lungs are clear to auscultation, no crackles or wheezes. Heart:  Normal rate and regular rhythm. S1 and S2 normal without gallop, murmur, click, rub or other extra sounds. Abdomen:  Bowel sounds positive,abdomen soft and non-tender without masses, organomegaly or hernias noted. Msk:  No deformity or scoliosis noted of thoracic or lumbar spine.   Extremities:  No clubbing, cyanosis, edema, or deformity noted with normal full range of motion of all joints.   Neurologic:  alert & oriented X3 and gait normal.   Skin:  Intact without suspicious lesions or rashes Cervical Nodes:  No lymphadenopathy noted Axillary Nodes:  No palpable lymphadenopathy Psych:  Cognition and judgment appear intact. Alert and cooperative with normal attention span and concentration. No apparent delusions, illusions, hallucinations       Assessment & Plan:   Well woman exam  ADD (attention deficit disorder)  Adjustment disorder with mixed anxiety and depressed mood  HLD (hyperlipidemia)  Depression  Essential hypertension No Follow-up on file.

## 2015-07-18 NOTE — Patient Instructions (Signed)
Good to see you. Please return in 1 month for lab work.

## 2015-07-24 ENCOUNTER — Other Ambulatory Visit (INDEPENDENT_AMBULATORY_CARE_PROVIDER_SITE_OTHER): Payer: BLUE CROSS/BLUE SHIELD

## 2015-07-24 DIAGNOSIS — Z01419 Encounter for gynecological examination (general) (routine) without abnormal findings: Secondary | ICD-10-CM

## 2015-07-24 DIAGNOSIS — Z0000 Encounter for general adult medical examination without abnormal findings: Secondary | ICD-10-CM | POA: Diagnosis not present

## 2015-07-24 DIAGNOSIS — D649 Anemia, unspecified: Secondary | ICD-10-CM

## 2015-07-24 LAB — FECAL OCCULT BLOOD, IMMUNOCHEMICAL: Fecal Occult Bld: NEGATIVE

## 2015-08-14 ENCOUNTER — Other Ambulatory Visit: Payer: Self-pay | Admitting: Family Medicine

## 2015-08-14 NOTE — Telephone Encounter (Signed)
Pt had annual physical on 07/18/15, medication last filled 07/12/15. Ok to refill?

## 2015-08-17 ENCOUNTER — Other Ambulatory Visit (INDEPENDENT_AMBULATORY_CARE_PROVIDER_SITE_OTHER): Payer: BLUE CROSS/BLUE SHIELD

## 2015-08-17 DIAGNOSIS — D649 Anemia, unspecified: Secondary | ICD-10-CM

## 2015-08-17 LAB — CBC WITH DIFFERENTIAL/PLATELET
BASOS ABS: 0.1 10*3/uL (ref 0.0–0.1)
Basophils Relative: 1.2 % (ref 0.0–3.0)
EOS ABS: 0 10*3/uL (ref 0.0–0.7)
Eosinophils Relative: 0.8 % (ref 0.0–5.0)
HEMATOCRIT: 34 % — AB (ref 36.0–46.0)
Hemoglobin: 11.3 g/dL — ABNORMAL LOW (ref 12.0–15.0)
Lymphocytes Relative: 28.6 % (ref 12.0–46.0)
Lymphs Abs: 1.5 10*3/uL (ref 0.7–4.0)
MCHC: 33.3 g/dL (ref 30.0–36.0)
MCV: 91.1 fl (ref 78.0–100.0)
MONOS PCT: 7.3 % (ref 3.0–12.0)
Monocytes Absolute: 0.4 10*3/uL (ref 0.1–1.0)
NEUTROS ABS: 3.3 10*3/uL (ref 1.4–7.7)
Neutrophils Relative %: 62.1 % (ref 43.0–77.0)
PLATELETS: 325 10*3/uL (ref 150.0–400.0)
RBC: 3.74 Mil/uL — AB (ref 3.87–5.11)
RDW: 15.7 % — ABNORMAL HIGH (ref 11.5–15.5)
WBC: 5.3 10*3/uL (ref 4.0–10.5)

## 2015-08-17 LAB — FERRITIN: Ferritin: 9.9 ng/mL — ABNORMAL LOW (ref 10.0–291.0)

## 2015-08-29 ENCOUNTER — Encounter: Payer: Self-pay | Admitting: Family Medicine

## 2015-08-29 ENCOUNTER — Ambulatory Visit (INDEPENDENT_AMBULATORY_CARE_PROVIDER_SITE_OTHER): Payer: BLUE CROSS/BLUE SHIELD | Admitting: Family Medicine

## 2015-08-29 VITALS — BP 122/78 | HR 83 | Temp 98.4°F | Wt 121.5 lb

## 2015-08-29 DIAGNOSIS — F909 Attention-deficit hyperactivity disorder, unspecified type: Secondary | ICD-10-CM | POA: Diagnosis not present

## 2015-08-29 DIAGNOSIS — F988 Other specified behavioral and emotional disorders with onset usually occurring in childhood and adolescence: Secondary | ICD-10-CM

## 2015-08-29 MED ORDER — AMPHETAMINE-DEXTROAMPHET ER 10 MG PO CP24: 10.0000 mg | ORAL_CAPSULE | Freq: Every day | ORAL | Status: DC

## 2015-08-29 NOTE — Assessment & Plan Note (Signed)
>  25 minutes spent in face to face time with patient, >50% spent in counselling or coordination of care Deteriorated. Has had formal ADD evaluation- request records. Start Adderall 10 mg XL daily. She will update me in a few weeks.

## 2015-08-29 NOTE — Patient Instructions (Signed)
We are starting Adderall XR 10 mg daily. Please update me in a couple of weeks.

## 2015-08-29 NOTE — Progress Notes (Signed)
Subjective:   Patient ID: Tricia Munoz, female    DOB: 23-Mar-1961, 55 y.o.   MRN: [id number]  Tricia Munoz is a pleasant 55 y.o. year old female who presents to clinic today with ADHD  on 08/29/2015  HPI:  ADD- formal evaluation 3 years ago with [facility]. At that time, she did not want to try any stimulants. She does take Wellbutrin and was hoping that would help with her difficulty concentrating.  Past several months, concentration and focus are worsening.  She is becoming more anxious about this.  Denies SI or HI.  Not sleeping well because she constantly is worried about tasks she has not finished.  Current Outpatient Prescriptions on File Prior to Visit  Medication Sig Dispense Refill  . azelastine (OPTIVAR) 0.05 % ophthalmic solution Apply 1 drop to eye 2 (two) times daily. 6 mL 12  . benazepril-hydrochlorthiazide (LOTENSIN HCT) 10-12.5 MG tablet TAKE 1 TABLET BY MOUTH ONCE A DAY 90 tablet 3  . BLACK COHOSH PO Take 1 capsule by mouth daily.    Marland Kitchen buPROPion (WELLBUTRIN XL) 150 MG 24 hr tablet Take 1 tablet (150 mg total) by mouth daily. 90 tablet 3  . cetirizine (ZYRTEC) 10 MG tablet Take 10 mg by mouth daily.    . mupirocin cream (BACTROBAN) 2 % Apply 1 application topically 2 (two) times daily. 15 g 0  . simvastatin (ZOCOR) 10 MG tablet TAKE 1 TABLET BY MOUTH AT BEDTIME 30 tablet 11  . venlafaxine XR (EFFEXOR-XR) 150 MG 24 hr capsule TAKE ONE CAPSULE BY MOUTH DAILY 30 capsule 3   No current facility-administered medications on file prior to visit.    No Known Allergies  Past Medical History  Diagnosis Date  . OA (osteoarthritis)   . Osteopenia   . Hypertension     No past surgical history on file.  Family History  Problem Relation Age of Onset  . Dementia Mother   . Hypertension Father   . Kidney disease Sister     Social History   Social History  . Marital Status: Married    Spouse Name: N/A  . Number of Children: 1  .  Years of Education: N/A   Occupational History  . self employeed    Social History Main Topics  . Smoking status: Never Smoker   . Smokeless tobacco: Never Used  . Alcohol Use: Yes     Comment: Occasional  . Drug Use: No  . Sexual Activity: Not on file   Other Topics Concern  . Not on file   Social History Narrative   Lives in [place] with husband. 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      Walks regularly   The PMH, PSH, Social History, Family History, Medications, and allergies have been reviewed in [facility], and have been updated if relevant.   Review of Systems  Psychiatric/Behavioral: Positive for sleep disturbance and decreased concentration. Negative for suicidal ideas, hallucinations, behavioral problems, confusion, self-injury, dysphoric mood and agitation. The patient is nervous/anxious. The patient is not hyperactive.   All other systems reviewed and are negative.      Objective:    BP 122/78 mmHg  Pulse 83  Temp(Src) 98.4 F (36.9 C) (Oral)  Wt 121 lb 8 oz (55.112 kg)  SpO2 97%  LMP 11/06/2012   Physical Exam  Constitutional: She is oriented to person, place, and time. She appears well-developed and well-nourished. No distress.  HENT:  Head: Normocephalic.  Eyes: Conjunctivae are normal.  Cardiovascular: Normal rate.   Pulmonary/Chest: Effort normal.  Musculoskeletal: Normal range of motion.  Neurological: She is alert and oriented to person, place, and time. No cranial nerve deficit.  Skin: Skin is warm and dry. She is not diaphoretic.  Psychiatric: She has a normal mood and affect. Her behavior is normal. Judgment and thought content normal.  Nursing note and vitals reviewed.         Assessment & Plan:   ADD (attention deficit disorder) No Follow-up on file.

## 2015-08-29 NOTE — Progress Notes (Signed)
Pre visit review using our clinic review tool, if applicable. No additional management support is needed unless otherwise documented below in the visit note. 

## 2015-09-13 ENCOUNTER — Ambulatory Visit (INDEPENDENT_AMBULATORY_CARE_PROVIDER_SITE_OTHER): Payer: BLUE CROSS/BLUE SHIELD | Admitting: Family Medicine

## 2015-09-13 ENCOUNTER — Encounter: Payer: Self-pay | Admitting: Family Medicine

## 2015-09-13 ENCOUNTER — Ambulatory Visit (INDEPENDENT_AMBULATORY_CARE_PROVIDER_SITE_OTHER)
Admission: RE | Admit: 2015-09-13 | Discharge: 2015-09-13 | Disposition: A | Discharge status: Home / Self Care | Payer: BLUE CROSS/BLUE SHIELD | Source: Ambulatory Visit | Attending: Family Medicine | Admitting: Family Medicine

## 2015-09-13 ENCOUNTER — Other Ambulatory Visit: Payer: Self-pay | Admitting: Family Medicine

## 2015-09-13 VITALS — BP 142/82 | HR 90 | Temp 97.9°F | Wt 121.5 lb

## 2015-09-13 DIAGNOSIS — R0781 Pleurodynia: Secondary | ICD-10-CM

## 2015-09-13 DIAGNOSIS — W19XXXA Unspecified fall, initial encounter: Secondary | ICD-10-CM

## 2015-09-13 DIAGNOSIS — S299XXA Unspecified injury of thorax, initial encounter: Secondary | ICD-10-CM | POA: Diagnosis not present

## 2015-09-13 NOTE — Progress Notes (Signed)
Subjective:   Patient ID: Tricia Munoz, female    DOB: 12/21/1960, 55 y.o.   MRN: [id number]  Tricia Munoz is a pleasant 55 y.o. year old female who presents to clinic today with Fall and Chest Pain  on 09/13/2015  HPI:  Was in her barn, tripped and fell into wooden spool- landed on her chest/sternum.  Fall occurred 1 week ago.  Since then, she has pain anterior chest every time she takes a deep breath, coughs, or changes positions in her sleep.  No SOB.  Tylenol has been helping a little with the pain.  Current Outpatient Prescriptions on File Prior to Visit  Medication Sig Dispense Refill  . amphetamine-dextroamphetamine (ADDERALL XR) 10 MG 24 hr capsule Take 1 capsule (10 mg total) by mouth daily. 30 capsule 0  . azelastine (OPTIVAR) 0.05 % ophthalmic solution Apply 1 drop to eye 2 (two) times daily. 6 mL 12  . benazepril-hydrochlorthiazide (LOTENSIN HCT) 10-12.5 MG tablet TAKE 1 TABLET BY MOUTH ONCE A DAY 90 tablet 3  . BLACK COHOSH PO Take 1 capsule by mouth daily.    Marland Kitchen. buPROPion (WELLBUTRIN XL) 150 MG 24 hr tablet Take 1 tablet (150 mg total) by mouth daily. 90 tablet 3  . cetirizine (ZYRTEC) 10 MG tablet Take 10 mg by mouth daily.    . mupirocin cream (BACTROBAN) 2 % Apply 1 application topically 2 (two) times daily. 15 g 0  . simvastatin (ZOCOR) 10 MG tablet TAKE 1 TABLET BY MOUTH AT BEDTIME 30 tablet 11  . venlafaxine XR (EFFEXOR-XR) 150 MG 24 hr capsule TAKE ONE CAPSULE BY MOUTH DAILY 30 capsule 3   No current facility-administered medications on file prior to visit.    No Known Allergies  Past Medical History  Diagnosis Date  . OA (osteoarthritis)   . Osteopenia   . Hypertension     No past surgical history on file.  Family History  Problem Relation Age of Onset  . Dementia Mother   . Hypertension Father   . Kidney disease Sister     Social History   Social History  . Marital Status: Married    Spouse Name: N/A  . Number of Children: 1  . Years  of Education: N/A   Occupational History  . self employeed    Social History Main Topics  . Smoking status: Never Smoker   . Smokeless tobacco: Never Used  . Alcohol Use: Yes     Comment: Occasional  . Drug Use: No  . Sexual Activity: Not on file   Other Topics Concern  . Not on file   Social History Narrative   Lives in [place] with husband. 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      Walks regularly   The PMH, PSH, Social History, Family History, Medications, and allergies have been reviewed in [facility], and have been updated if relevant.   Review of Systems  Respiratory: Negative for cough, choking, chest tightness, shortness of breath, wheezing and stridor.   Cardiovascular: Positive for chest pain. Negative for leg swelling.  Allergic/Immunologic: Negative.   Neurological: Negative.   Hematological: Negative.   Psychiatric/Behavioral: Negative.   All other systems reviewed and are negative.      Objective:    BP 142/82 mmHg  Pulse 90  Temp(Src) 97.9 F (36.6 C) (Oral)  Wt 121 lb 8 oz (55.112 kg)  SpO2 96%  LMP 11/06/2012   Physical Exam  Constitutional: She  is oriented to person, place, and time. She appears well-developed and well-nourished. No distress.  HENT:  Head: Normocephalic.  Eyes: Conjunctivae are normal.  Pulmonary/Chest: She exhibits bony tenderness.  Difficult to assess lung sounds- pain with deep inspiration  Musculoskeletal: Normal range of motion.  Neurological: She is alert and oriented to person, place, and time. No cranial nerve deficit.  Skin: Skin is warm and dry. She is not diaphoretic.  Psychiatric: She has a normal mood and affect. Her behavior is normal. Judgment and thought content normal.  Nursing note and vitals reviewed.         Assessment & Plan:   Fall, initial encounter  Rib pain No Follow-up on file.

## 2015-09-13 NOTE — Assessment & Plan Note (Signed)
New- probable rib fx. Discussed supportive care. Continue tylenol as needed. CXR today. Call or return to clinic prn if these symptoms worsen or fail to improve as anticipated. The patient indicates understanding of these issues and agrees with the plan.

## 2015-09-13 NOTE — Patient Instructions (Signed)

## 2015-09-13 NOTE — Assessment & Plan Note (Signed)
Now with probable rib fracture/contusion. Given that unable to hear good lung sounds due to pain, will get CXR today to ensure lungs look ok. Explained to pt that rib fxs may not appear on xray. Discussed supportive care as per AVS.

## 2015-09-13 NOTE — Progress Notes (Signed)
Pre visit review using our clinic review tool, if applicable. No additional management support is needed unless otherwise documented below in the visit note. 

## 2015-09-23 ENCOUNTER — Other Ambulatory Visit: Payer: Self-pay | Admitting: Family Medicine

## 2015-09-25 NOTE — Telephone Encounter (Signed)
Ok to print and put in my box for signature. 

## 2015-09-25 NOTE — Telephone Encounter (Signed)
Last office visit 09/13/2015.  Last refilled 08/29/2015 for #30 with no refills.  Ok to refill?

## 2015-09-26 ENCOUNTER — Telehealth: Payer: Self-pay | Admitting: Family Medicine

## 2015-09-27 ENCOUNTER — Ambulatory Visit: Payer: BLUE CROSS/BLUE SHIELD | Admitting: Internal Medicine

## 2015-09-27 ENCOUNTER — Ambulatory Visit: Payer: Self-pay | Admitting: Internal Medicine

## 2015-09-27 NOTE — Telephone Encounter (Signed)
Spoke to pt and informed her Rx is available for pickup from the front desk 

## 2015-10-04 ENCOUNTER — Ambulatory Visit (INDEPENDENT_AMBULATORY_CARE_PROVIDER_SITE_OTHER): Payer: BLUE CROSS/BLUE SHIELD | Admitting: Family Medicine

## 2015-10-04 ENCOUNTER — Encounter: Payer: Self-pay | Admitting: Family Medicine

## 2015-10-04 VITALS — BP 136/72 | HR 70 | Temp 98.3°F | Wt 122.0 lb

## 2015-10-04 DIAGNOSIS — F909 Attention-deficit hyperactivity disorder, unspecified type: Secondary | ICD-10-CM | POA: Diagnosis not present

## 2015-10-04 DIAGNOSIS — F988 Other specified behavioral and emotional disorders with onset usually occurring in childhood and adolescence: Secondary | ICD-10-CM

## 2015-10-04 DIAGNOSIS — J3089 Other allergic rhinitis: Secondary | ICD-10-CM | POA: Insufficient documentation

## 2015-10-04 DIAGNOSIS — J302 Other seasonal allergic rhinitis: Secondary | ICD-10-CM | POA: Diagnosis not present

## 2015-10-04 MED ORDER — AMPHETAMINE-DEXTROAMPHET ER 20 MG PO CP24: 20.0000 mg | ORAL_CAPSULE | Freq: Every day | ORAL | Status: DC

## 2015-10-04 MED ORDER — LEVOCETIRIZINE DIHYDROCHLORIDE 5 MG PO TABS: 5.0000 mg | ORAL_TABLET | Freq: Every evening | ORAL | Status: DC

## 2015-10-04 NOTE — Patient Instructions (Signed)
Great to see you. We are increasing your Adderall to 20 mg daily.   Stop taking zyrtec.  Start taking xyzal.  Please keep me updated.  Happy birthday!

## 2015-10-04 NOTE — Assessment & Plan Note (Signed)
Deteriorated. D/c zyrtec. eRx sent for xyzal. Call or return to clinic prn if these symptoms worsen or fail to improve as anticipated. The patient indicates understanding of these issues and agrees with the plan.

## 2015-10-04 NOTE — Progress Notes (Signed)
Pre visit review using our clinic review tool, if applicable. No additional management support is needed unless otherwise documented below in the visit note. 

## 2015-10-04 NOTE — Progress Notes (Signed)
Subjective:   Patient ID: Tricia Munoz, female    DOB: May 04, 1960, 55 y.o.   MRN: [id number]  Tricia Capronrenia B Kirsten is a pleasant 55 y.o. year old female who presents to clinic today with Follow-up  on 10/04/2015  HPI:  ADD- formal evaluation 3 years ago with [facility]. At that time, she did not want to try any stimulants.    Started Adderall XR 10 mg daily in 08/2015.  Weaned herself off of Wellbutrin. Still feels like she is not concentrating well.  Anxious about not completing tasks.  Denies SI or HI.  Allergic rhinitis- symptoms deteriorated.  OTC daily zyrtec not as effective for her this year.  Eyes feel more swollen and puffy.  Current Outpatient Prescriptions on File Prior to Visit  Medication Sig Dispense Refill  . azelastine (OPTIVAR) 0.05 % ophthalmic solution Apply 1 drop to eye 2 (two) times daily. 6 mL 12  . benazepril-hydrochlorthiazide (LOTENSIN HCT) 10-12.5 MG tablet TAKE 1 TABLET BY MOUTH ONCE A DAY 90 tablet 3  . BLACK COHOSH PO Take 1 capsule by mouth daily.    . mupirocin cream (BACTROBAN) 2 % Apply 1 application topically 2 (two) times daily. 15 g 0  . simvastatin (ZOCOR) 10 MG tablet TAKE 1 TABLET BY MOUTH AT BEDTIME 30 tablet 11  . venlafaxine XR (EFFEXOR-XR) 150 MG 24 hr capsule TAKE ONE CAPSULE BY MOUTH DAILY 30 capsule 3   No current facility-administered medications on file prior to visit.    No Known Allergies  Past Medical History  Diagnosis Date  . OA (osteoarthritis)   . Osteopenia   . Hypertension     No past surgical history on file.  Family History  Problem Relation Age of Onset  . Dementia Mother   . Hypertension Father   . Kidney disease Sister     Social History   Social History  . Marital Status: Married    Spouse Name: N/A  . Number of Children: 1  . Years of Education: N/A   Occupational History  . self employeed    Social History Main Topics  . Smoking status: Never Smoker   . Smokeless  tobacco: Never Used  . Alcohol Use: Yes     Comment: Occasional  . Drug Use: No  . Sexual Activity: Not on file   Other Topics Concern  . Not on file   Social History Narrative   Lives in [place] with husband. 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 57 year old daughter Bellaire.      Walks regularly   The PMH, PSH, Social History, Family History, Medications, and allergies [facility], and have been updated if relevant.   Review of Systems  Constitutional: Negative.   HENT: Positive for congestion and postnasal drip.   Eyes: Negative for photophobia and visual disturbance.  Respiratory: Negative.   Cardiovascular: Negative.   Psychiatric/Behavioral: Positive for sleep disturbance and decreased concentration. Negative for suicidal ideas, hallucinations, behavioral problems, confusion, self-injury, dysphoric mood and agitation. The patient is nervous/anxious. The patient is not hyperactive.   All other systems reviewed and are negative.      Objective:    BP 136/72 mmHg  Pulse 70  Temp(Src) 98.3 F (36.8 C) (Oral)  Wt 122 lb (55.339 kg)  SpO2 97%  LMP 11/06/2012   Physical Exam  Constitutional: She is oriented to person, place, and time. She appears well-developed and well-nourished. No distress.  HENT:  Head: Normocephalic.  Eyes: Pupils are equal, round, and reactive to light. Right conjunctiva is injected. Left conjunctiva is injected. Right eye exhibits normal extraocular motion. Left eye exhibits normal extraocular motion.  Cardiovascular: Normal rate.   Pulmonary/Chest: Effort normal.  Musculoskeletal: Normal range of motion.  Neurological: She is alert and oriented to person, place, and time. No cranial nerve deficit.  Skin: Skin is warm and dry. She is not diaphoretic.  Psychiatric: She has a normal mood and affect. Her behavior is normal. Judgment and thought content normal.  Nursing note and vitals reviewed.         Assessment & Plan:    ADD (attention deficit disorder)  Other seasonal allergic rhinitis No Follow-up on file.

## 2015-10-04 NOTE — Assessment & Plan Note (Signed)
>  25 minutes spent in face to face time with patient, >50% spent in counselling or coordination of care Remains poorly controlled. Increase Adderall XR to 20 mg daily. She will update me over next several weeks. The patient indicates understanding of these issues and agrees with the plan.

## 2015-10-06 ENCOUNTER — Telehealth: Payer: Self-pay | Admitting: *Deleted

## 2015-10-06 NOTE — Telephone Encounter (Signed)
Fax received indicating PA required for pts adderall. Completed via [email or web address] and received a favorable outcome. Copy faxed to pts pharmacy. Sent for scanning; awaiting official approval letter

## 2015-10-20 NOTE — Telephone Encounter (Signed)
error 

## 2015-10-24 ENCOUNTER — Encounter: Payer: Self-pay | Admitting: Family Medicine

## 2015-10-25 ENCOUNTER — Other Ambulatory Visit: Payer: Self-pay | Admitting: Family Medicine

## 2015-10-25 MED ORDER — SIMVASTATIN 10 MG PO TABS: 10.0000 mg | ORAL_TABLET | Freq: Every day | ORAL | Status: DC

## 2015-10-25 MED ORDER — AMPHETAMINE-DEXTROAMPHET ER 25 MG PO CP24: 25.0000 mg | ORAL_CAPSULE | ORAL | Status: DC

## 2015-11-24 ENCOUNTER — Ambulatory Visit (INDEPENDENT_AMBULATORY_CARE_PROVIDER_SITE_OTHER): Payer: BLUE CROSS/BLUE SHIELD | Admitting: Internal Medicine

## 2015-11-24 ENCOUNTER — Encounter: Payer: Self-pay | Admitting: Internal Medicine

## 2015-11-24 DIAGNOSIS — N3000 Acute cystitis without hematuria: Secondary | ICD-10-CM | POA: Diagnosis not present

## 2015-11-24 LAB — POC URINALSYSI DIPSTICK (AUTOMATED)
BILIRUBIN UA: NEGATIVE
GLUCOSE UA: NEGATIVE
Ketones, UA: NEGATIVE
Nitrite, UA: NEGATIVE
Protein, UA: NEGATIVE
RBC UA: NEGATIVE
SPEC GRAV UA: 1.015
Urobilinogen, UA: 4
pH, UA: 6

## 2015-11-24 MED ORDER — SULFAMETHOXAZOLE-TRIMETHOPRIM 800-160 MG PO TABS: 1.0000 | ORAL_TABLET | Freq: Two times a day (BID) | ORAL | 0 refills | Status: DC

## 2015-11-24 NOTE — Patient Instructions (Signed)
Start the antibiotic now and take a second dose tonight. If your symptoms are gone tomorrow, you can stop after 3 days.

## 2015-11-24 NOTE — Progress Notes (Signed)
Pre visit review using our clinic review tool, if applicable. No additional management support is needed unless otherwise documented below in the visit note. 

## 2015-11-24 NOTE — Addendum Note (Signed)
Addended by: Eual FinesBRIDGES, Faigy Stretch P on: 11/24/2015 02:27 PM   Modules accepted: Orders

## 2015-11-24 NOTE — Progress Notes (Signed)
   Subjective:    Patient ID: Tricia Munoz, female    DOB: 22-May-1960, 55 y.o.   MRN: [id number]  HPI Here due to urinary symptoms  Started with dysuria this morning Some urgency  Abdominal pressure Hasn't felt well for 2 days --nothing specific No chills or sweats No fever  Current Outpatient Prescriptions on File Prior to Visit  Medication Sig Dispense Refill  . amphetamine-dextroamphetamine (ADDERALL XR) 25 MG 24 hr capsule Take 1 capsule by mouth every morning. 30 capsule 0  . azelastine (OPTIVAR) 0.05 % ophthalmic solution Apply 1 drop to eye 2 (two) times daily. 6 mL 12  . benazepril-hydrochlorthiazide (LOTENSIN HCT) 10-12.5 MG tablet TAKE 1 TABLET BY MOUTH ONCE A DAY 90 tablet 3  . BLACK COHOSH PO Take 1 capsule by mouth daily.    Marland Kitchen. levocetirizine (XYZAL ALLERGY 24HR) 5 MG tablet Take 1 tablet (5 mg total) by mouth every evening. 30 tablet 3  . simvastatin (ZOCOR) 10 MG tablet Take 1 tablet (10 mg total) by mouth at bedtime. 30 tablet 11  . venlafaxine XR (EFFEXOR-XR) 150 MG 24 hr capsule TAKE ONE CAPSULE BY MOUTH DAILY 30 capsule 3  . mupirocin cream (BACTROBAN) 2 % Apply 1 application topically 2 (two) times daily. (Patient not taking: Reported on 11/24/2015) 15 g 0   No current facility-administered medications on file prior to visit.     No Known Allergies  Past Medical History:  Diagnosis Date  . Hypertension   . OA (osteoarthritis)   . Osteopenia     No past surgical history on file.  Family History  Problem Relation Age of Onset  . Dementia Mother   . Hypertension Father   . Kidney disease Sister     Social History   Social History  . Marital status: Married    Spouse name: N/A  . Number of children: 1  . Years of education: N/A   Occupational History  . self employeed    Social History Main Topics  . Smoking status: Never Smoker  . Smokeless tobacco: Never Used  . Alcohol use Yes     Comment: Occasional  . Drug use: No  . Sexual  activity: Not on file   Other Topics Concern  . Not on file   Social History Narrative   [place] in [place]. 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      Walks regularly   Review of Systems Some nausea Appetite is off No vomiting Slight back pain    Objective:   Physical Exam  Constitutional: She appears well-developed and well-nourished. No distress.  Abdominal: Soft. She exhibits no distension and no mass. There is no rebound and no guarding.  Mild lower abdominal tenderness  Musculoskeletal:  No CVA tenderness          Assessment & Plan:

## 2015-11-24 NOTE — Assessment & Plan Note (Signed)
With some mild systemic symptoms U/A mildly abnormal Will treat with septra DS 3 days may be enough

## 2015-11-29 ENCOUNTER — Other Ambulatory Visit: Payer: Self-pay | Admitting: Family Medicine

## 2015-11-30 MED ORDER — AMPHETAMINE-DEXTROAMPHET ER 25 MG PO CP24: 25.0000 mg | ORAL_CAPSULE | ORAL | 0 refills | Status: DC

## 2015-11-30 NOTE — Telephone Encounter (Signed)
Last f/u 09/2015 

## 2015-11-30 NOTE — Telephone Encounter (Signed)
Lm on pts vm and informed her Rx is available for pickup from the front desk. Pt advised third party unable to pickup  

## 2015-12-01 ENCOUNTER — Encounter: Payer: Self-pay | Admitting: Family Medicine

## 2015-12-01 DIAGNOSIS — Z79899 Other long term (current) drug therapy: Secondary | ICD-10-CM | POA: Diagnosis not present

## 2015-12-15 ENCOUNTER — Other Ambulatory Visit: Payer: Self-pay | Admitting: Family Medicine

## 2015-12-18 ENCOUNTER — Encounter: Payer: Self-pay | Admitting: Family Medicine

## 2015-12-25 ENCOUNTER — Encounter: Payer: Self-pay | Admitting: Family Medicine

## 2015-12-25 ENCOUNTER — Ambulatory Visit (INDEPENDENT_AMBULATORY_CARE_PROVIDER_SITE_OTHER): Payer: BLUE CROSS/BLUE SHIELD | Admitting: Family Medicine

## 2015-12-25 VITALS — BP 114/76 | HR 87 | Temp 98.3°F | Wt 117.5 lb

## 2015-12-25 DIAGNOSIS — F988 Other specified behavioral and emotional disorders with onset usually occurring in childhood and adolescence: Secondary | ICD-10-CM

## 2015-12-25 DIAGNOSIS — Z23 Encounter for immunization: Secondary | ICD-10-CM

## 2015-12-25 MED ORDER — AMPHETAMINE-DEXTROAMPHET ER 30 MG PO CP24: 30.0000 mg | ORAL_CAPSULE | ORAL | 0 refills | Status: DC

## 2015-12-25 NOTE — Progress Notes (Signed)
Subjective:   Patient ID: Tricia Munoz, female    DOB: 1960-12-04, 55 y.o.   MRN: [id number]  Tricia Munoz is a pleasant 55 y.o. year old female who presents to clinic today with Follow-up  on 12/25/2015  HPI:  ADD- formal evaluation 3 years ago with [facility]. At that time, she did not want to try any stimulants. Started Adderall XR 10 mg daily in 08/2015.  Weaned herself off of Wellbutrin. Increased dosage and is now taking Adderall XR 25 mg daily.  She feels like it is helping but concerned the dosage isn't right.  Denies SI or HI.  Current Outpatient Prescriptions on File Prior to Visit  Medication Sig Dispense Refill  . amphetamine-dextroamphetamine (ADDERALL XR) 25 MG 24 hr capsule Take 1 capsule by mouth every morning. 30 capsule 0  . azelastine (OPTIVAR) 0.05 % ophthalmic solution Apply 1 drop to eye 2 (two) times daily. 6 mL 12  . benazepril-hydrochlorthiazide (LOTENSIN HCT) 10-12.5 MG tablet TAKE 1 TABLET BY MOUTH ONCE A DAY 90 tablet 3  . BLACK COHOSH PO Take 1 capsule by mouth daily.    Marland Kitchen levocetirizine (XYZAL ALLERGY 24HR) 5 MG tablet Take 1 tablet (5 mg total) by mouth every evening. 30 tablet 3  . mupirocin cream (BACTROBAN) 2 % Apply 1 application topically 2 (two) times daily. 15 g 0  . simvastatin (ZOCOR) 10 MG tablet Take 1 tablet (10 mg total) by mouth at bedtime. 30 tablet 11  . venlafaxine XR (EFFEXOR-XR) 150 MG 24 hr capsule TAKE ONE CAPSULE BY MOUTH DAILY 30 capsule 2   No current facility-administered medications on file prior to visit.     No Known Allergies  Past Medical History:  Diagnosis Date  . Hypertension   . OA (osteoarthritis)   . Osteopenia     No past surgical history on file.  Family History  Problem Relation Age of Onset  . Dementia Mother   . Hypertension Father   . Kidney disease Sister     Social History   Social History  . Marital status: Married    Spouse name: N/A  . Number of  children: 1  . Years of education: N/A   Occupational History  . self employeed    Social History Main Topics  . Smoking status: Never Smoker  . Smokeless tobacco: Never Used  . Alcohol use Yes     Comment: Occasional  . Drug use: No  . Sexual activity: Not on file   Other Topics Concern  . Not on file   Social History Narrative   Lives in [place] with husband. 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   Review of Systems  Constitutional: Negative.   Psychiatric/Behavioral: Positive for decreased concentration. Negative for agitation, behavioral problems, confusion, dysphoric mood, hallucinations, self-injury, sleep disturbance and suicidal ideas. The patient is not nervous/anxious and is not hyperactive.   All other systems reviewed and are negative.      Objective:    BP 114/76   Pulse 87   Temp 98.3 F (36.8 C) (Oral)   Wt 117 lb 8 oz (53.3 kg)   LMP 11/06/2012   SpO2 97%   BMI 22.02 kg/m    Physical Exam  Constitutional: She is  oriented to person, place, and time. She appears well-developed and well-nourished. No distress.  HENT:  Head: Normocephalic and atraumatic.  Eyes: Conjunctivae are normal.  Cardiovascular: Normal rate.   Pulmonary/Chest: Effort normal.  Musculoskeletal: Normal range of motion.  Neurological: She is alert and oriented to person, place, and time. No cranial nerve deficit.  Skin: Skin is warm and dry. She is not diaphoretic.  Psychiatric: She has a normal mood and affect. Her behavior is normal. Judgment and thought content normal.  Nursing note and vitals reviewed.         Assessment & Plan:   ADD (attention deficit disorder) No Follow-up on file.

## 2015-12-25 NOTE — Assessment & Plan Note (Signed)
Deteriorated. >15 minutes spent in face to face time with patient, >50% spent in counselling or coordination of care. Increase dose of Adderall XR to 30 mg daily. Call or return to clinic prn if these symptoms worsen or fail to improve as anticipated. The patient indicates understanding of these issues and agrees with the plan.

## 2015-12-25 NOTE — Progress Notes (Signed)
Pre visit review using our clinic review tool, if applicable. No additional management support is needed unless otherwise documented below in the visit note. 

## 2016-01-12 ENCOUNTER — Other Ambulatory Visit: Payer: Self-pay | Admitting: Family Medicine

## 2016-01-26 ENCOUNTER — Other Ambulatory Visit: Payer: Self-pay | Admitting: Family Medicine

## 2016-01-29 ENCOUNTER — Encounter: Payer: Self-pay | Admitting: Family Medicine

## 2016-01-29 ENCOUNTER — Ambulatory Visit (INDEPENDENT_AMBULATORY_CARE_PROVIDER_SITE_OTHER): Payer: BLUE CROSS/BLUE SHIELD | Admitting: Family Medicine

## 2016-01-29 VITALS — BP 128/80 | HR 80 | Temp 98.8°F | Wt 116.8 lb

## 2016-01-29 DIAGNOSIS — J069 Acute upper respiratory infection, unspecified: Secondary | ICD-10-CM

## 2016-01-29 MED ORDER — AMPHETAMINE-DEXTROAMPHET ER 30 MG PO CP24: 30.0000 mg | ORAL_CAPSULE | ORAL | 0 refills | Status: DC

## 2016-01-29 MED ORDER — AZITHROMYCIN 250 MG PO TABS: ORAL_TABLET | ORAL | 0 refills | Status: DC

## 2016-01-29 NOTE — Progress Notes (Signed)
SUBJECTIVE: Tricia Munoz is a 55 y.o. female who complains of right ear pain x 1 week. Started out with runny nose, cough, congestion a few days prior to the ear pain starting.  Current Outpatient Prescriptions on File Prior to Visit  Medication Sig Dispense Refill  . amphetamine-dextroamphetamine (ADDERALL XR) 30 MG 24 hr capsule Take 1 capsule (30 mg total) by mouth every morning. 30 capsule 0  . azelastine (OPTIVAR) 0.05 % ophthalmic solution Apply 1 drop to eye 2 (two) times daily. 6 mL 12  . benazepril-hydrochlorthiazide (LOTENSIN HCT) 10-12.5 MG tablet TAKE 1 TABLET BY MOUTH ONCE A DAY 90 tablet 3  . BLACK COHOSH PO Take 1 capsule by mouth daily.    Marland Kitchen. levocetirizine (XYZAL) 5 MG tablet TAKE ONE TABLET BY MOUTH EVERY EVENING 30 tablet 2  . simvastatin (ZOCOR) 10 MG tablet Take 1 tablet (10 mg total) by mouth at bedtime. 30 tablet 11  . venlafaxine XR (EFFEXOR-XR) 150 MG 24 hr capsule TAKE ONE CAPSULE BY MOUTH DAILY 30 capsule 2  . mupirocin cream (BACTROBAN) 2 % Apply 1 application topically 2 (two) times daily. (Patient not taking: Reported on 01/29/2016) 15 g 0   No current facility-administered medications on file prior to visit.     No Known Allergies  Past Medical History:  Diagnosis Date  . Hypertension   . OA (osteoarthritis)   . Osteopenia     No past surgical history on file.  Family History  Problem Relation Age of Onset  . Dementia Mother   . Hypertension Father   . Kidney disease Sister     Social History   Social History  . Marital status: Married    Spouse name: N/A  . Number of children: 1  . Years of education: N/A   Occupational History  . self employeed    Social History Main Topics  . Smoking status: Never Smoker  . Smokeless tobacco: Never Used  . Alcohol use Yes     Comment: Occasional  . Drug use: No  . Sexual activity: Not on file   Other Topics Concern  . Not on file   Social History Narrative   [place]ives in [place] with  husband. 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      Walks regularly   The PMH, PSH, Social History, Family History, Medications, and [facility], and have been updated if relevant.  OBJECTIVE: BP 128/80 (BP Location: Left Arm, Patient Position: Sitting, Cuff Size: Normal)   Pulse 80   Temp 98.8 F (37.1 C) (Oral)   Wt 116 lb 12 oz (53 kg)   LMP 11/06/2012   SpO2 97%   BMI 21.88 kg/m  General appearance: alert, well appearing, and in no distress, oriented to person, place, and time and normal appearing weight.   Ears: left ear normal, right TM red, dull, bulging Nose: mucosal erythema Oropharynx: mucous membranes moist, pharynx normal without lesions Neck: supple, no significant adenopathy Lungs: clear to auscultation, no wheezes, rales or rhonchi, symmetric air entry  ASSESSMENT: Otitis Media  PLAN: 1) See orders for this visit as documented in the electronic medical record. 2) Symptomatic therapy suggested: use acetaminophen, ibuprofen prn.  3) Call or return to clinic prn if these symptoms worsen or fail to improve as anticipated.

## 2016-01-29 NOTE — Progress Notes (Signed)
Pre visit review using our clinic review tool, if applicable. No additional management support is needed unless otherwise documented below in the visit note. 

## 2016-02-16 ENCOUNTER — Encounter: Payer: Self-pay | Admitting: Family Medicine

## 2016-02-16 ENCOUNTER — Ambulatory Visit (INDEPENDENT_AMBULATORY_CARE_PROVIDER_SITE_OTHER): Payer: BLUE CROSS/BLUE SHIELD | Admitting: Family Medicine

## 2016-02-16 DIAGNOSIS — H6981 Other specified disorders of Eustachian tube, right ear: Secondary | ICD-10-CM | POA: Diagnosis not present

## 2016-02-16 DIAGNOSIS — J302 Other seasonal allergic rhinitis: Secondary | ICD-10-CM

## 2016-02-16 NOTE — Progress Notes (Signed)
Pre visit review using our clinic review tool, if applicable. No additional management support is needed unless otherwise documented below in the visit note. 

## 2016-02-16 NOTE — Patient Instructions (Addendum)
Trial of nasal saline 2-3 times daily,  Nasal flonase 2 spray per nostril daily.  Can use mucinex to thin mucus.  Continue Xyzal.

## 2016-02-16 NOTE — Assessment & Plan Note (Signed)
Otitis media resolved. Start nasal steroid and saline irrigation. Can use mucinex to thin mucus.

## 2016-02-16 NOTE — Addendum Note (Signed)
Addended by: Desmond DikeKNIGHT, Kaelen Caughlin H on: 02/16/2016 11:19 AM   Modules accepted: Orders

## 2016-02-16 NOTE — Progress Notes (Signed)
   Subjective:    Patient ID: Tricia Munoz, female    DOB: Jul 21, 1960, 55 y.o.   MRN: [id number]  Otalgia   There is pain in the right ear. This is a recurrent problem. The current episode started in the past 7 days (treated for OM with zpack 10/16). The problem occurs constantly. The problem has been waxing and waning. There has been no fever. The pain is moderate. Associated symptoms include coughing, neck pain and rhinorrhea. Pertinent negatives include no ear discharge, rash or sore throat. Associated symptoms comments: Mild  Post nasl drip. She has tried acetaminophen and antibiotics for the symptoms. The treatment provided moderate relief. There is no history of a chronic ear infection, hearing loss or a tympanostomy tube.     She is on Xyzal  for allergies.  Nasal sprays make her nose sore.  Review of Systems  HENT: Positive for ear pain and rhinorrhea. Negative for ear discharge and sore throat.   Respiratory: Positive for cough.   Musculoskeletal: Positive for neck pain.  Skin: Negative for rash.       Objective:   Physical Exam  Constitutional: Vital signs are normal. She appears well-developed and well-nourished. She is cooperative.  Non-toxic appearance. She does not appear ill. No distress.  HENT:  Head: Normocephalic.  Right Ear: Hearing, external ear and ear canal normal. No drainage. Tympanic membrane is not injected, not scarred, not perforated, not erythematous, not retracted and not bulging. A middle ear effusion is present.  Left Ear: Hearing, tympanic membrane, external ear and ear canal normal. Tympanic membrane is not erythematous, not retracted and not bulging.  Nose: Mucosal edema and rhinorrhea present. Right sinus exhibits no maxillary sinus tenderness and no frontal sinus tenderness. Left sinus exhibits no maxillary sinus tenderness and no frontal sinus tenderness.  Mouth/Throat: Uvula is midline, oropharynx is clear and moist and mucous membranes are normal.   Eyes: Conjunctivae, EOM and lids are normal. Pupils are equal, round, and reactive to light. Lids are everted and swept, no foreign bodies found.  Neck: Trachea normal and normal range of motion. Neck supple. Carotid bruit is not present. No thyroid mass and no thyromegaly present.  Cardiovascular: Normal rate, regular rhythm, S1 normal, S2 normal, normal heart sounds, intact distal pulses and normal pulses.  Exam reveals no gallop and no friction rub.   No murmur heard. Pulmonary/Chest: Effort normal and breath sounds normal. No tachypnea. No respiratory distress. She has no decreased breath sounds. She has no wheezes. She has no rhonchi. She has no rales.  Neurological: She is alert.  Skin: Skin is warm, dry and intact. No rash noted.  Psychiatric: Her speech is normal and behavior is normal. Judgment normal. Her mood appears not anxious. Cognition and memory are normal. She does not exhibit a depressed mood.          Assessment & Plan:

## 2016-02-16 NOTE — Assessment & Plan Note (Signed)
Allergen avoidance. Continue antihistamine.

## 2016-02-24 ENCOUNTER — Other Ambulatory Visit: Payer: Self-pay | Admitting: Family Medicine

## 2016-02-27 MED ORDER — AMPHETAMINE-DEXTROAMPHET ER 30 MG PO CP24: 30.0000 mg | ORAL_CAPSULE | ORAL | 0 refills | Status: DC

## 2016-02-27 NOTE — Telephone Encounter (Signed)
Spoke to pt and informed her Rx is available for pickup from the front desk 

## 2016-02-27 NOTE — Telephone Encounter (Signed)
Last f/u 12/2015 

## 2016-03-04 ENCOUNTER — Other Ambulatory Visit: Payer: Self-pay | Admitting: Family Medicine

## 2016-03-27 ENCOUNTER — Other Ambulatory Visit: Payer: Self-pay

## 2016-03-27 MED ORDER — AMPHETAMINE-DEXTROAMPHET ER 30 MG PO CP24: 30.0000 mg | ORAL_CAPSULE | ORAL | 0 refills | Status: DC

## 2016-03-27 NOTE — Telephone Encounter (Signed)
Spoke to pt and informed her Rx is available for pickup from the front desk 

## 2016-03-27 NOTE — Telephone Encounter (Signed)
Pt left v/m requesting rx for Adderall. Call when ready for pick up. Last printed # 30 on 02/27/16. Last seen for ADD on 12/25/15.

## 2016-04-09 ENCOUNTER — Other Ambulatory Visit: Payer: Self-pay | Admitting: Family Medicine

## 2016-04-19 ENCOUNTER — Other Ambulatory Visit: Payer: Self-pay

## 2016-04-19 NOTE — Telephone Encounter (Signed)
Ok to print and put on my desk for signature. 

## 2016-04-19 NOTE — Telephone Encounter (Signed)
Pt left v/m requesting rx for Adderall. Call when ready for pick up. Last printed # 30 on 03/27/16. Last seen 12/25/15 for ADD visit.

## 2016-04-22 MED ORDER — AMPHETAMINE-DEXTROAMPHET ER 30 MG PO CP24: 30.0000 mg | ORAL_CAPSULE | ORAL | 0 refills | Status: DC

## 2016-04-24 ENCOUNTER — Other Ambulatory Visit: Payer: Self-pay | Admitting: Family Medicine

## 2016-04-24 NOTE — Telephone Encounter (Signed)
Spoke to pt and informed her Rx is available for pickup from the front desk 

## 2016-05-28 ENCOUNTER — Encounter: Payer: Self-pay | Admitting: Family Medicine

## 2016-05-28 ENCOUNTER — Other Ambulatory Visit: Payer: Self-pay

## 2016-05-28 MED ORDER — AMPHETAMINE-DEXTROAMPHET ER 30 MG PO CP24: 30.0000 mg | ORAL_CAPSULE | ORAL | 0 refills | Status: DC

## 2016-05-28 NOTE — Telephone Encounter (Signed)
Spoke to pt and informed her Rx is available for pickup from the front desk 

## 2016-05-28 NOTE — Telephone Encounter (Signed)
Pt left v/m requesting rx for Adderall. Call when ready for pick up. rx last printed # 30 on 04/22/16; pt last seen 12/25/15, pt has 2 pills left.

## 2016-05-29 ENCOUNTER — Encounter: Payer: Self-pay | Admitting: Family Medicine

## 2016-05-29 DIAGNOSIS — Z79899 Other long term (current) drug therapy: Secondary | ICD-10-CM | POA: Diagnosis not present

## 2016-06-19 ENCOUNTER — Ambulatory Visit (INDEPENDENT_AMBULATORY_CARE_PROVIDER_SITE_OTHER): Payer: BLUE CROSS/BLUE SHIELD | Admitting: Family Medicine

## 2016-06-19 ENCOUNTER — Encounter: Payer: Self-pay | Admitting: Family Medicine

## 2016-06-19 DIAGNOSIS — R21 Rash and other nonspecific skin eruption: Secondary | ICD-10-CM | POA: Diagnosis not present

## 2016-06-19 MED ORDER — FLUOCINONIDE-E 0.05 % EX CREA: 1.0000 "application " | TOPICAL_CREAM | Freq: Two times a day (BID) | CUTANEOUS | 0 refills | Status: DC

## 2016-06-19 MED ORDER — AMPHETAMINE-DEXTROAMPHET ER 30 MG PO CP24: 30.0000 mg | ORAL_CAPSULE | ORAL | 0 refills | Status: DC

## 2016-06-19 NOTE — Progress Notes (Signed)
Pre visit review using our clinic review tool, if applicable. No additional management support is needed unless otherwise documented below in the visit note. 

## 2016-06-19 NOTE — Assessment & Plan Note (Signed)
New- ? Eczema vs psoriasis. Will try topical lidex twice daily x 10 days. If no improvement, send to derm for biopsy and further treatment.

## 2016-06-19 NOTE — Patient Instructions (Signed)
Great to see you.  Let's try Lidex cream twice daily for 10 days and then call me.

## 2016-06-19 NOTE — Progress Notes (Signed)
Subjective:   Patient ID: Tricia Munoz, female    DOB: 1961-02-03, 56 y.o.   MRN: [id number]  Tricia Capronrenia B Bonnell is a pleasant 56 y.o. year old female who presents to clinic today with Rash (On left forearm and back of neck. Itches. Tried cortisone cream, lotion. Has had it for quite awhile.)  on 06/19/2016  HPI:  Itchy rash on her left forearm, neck.  Has tried OTC cortisone without much relief . Also been applying more lotion in case the cause was dry skin.  Has been there for months.  No new soaps or detergents.  Has never had anything like this before.     Current Outpatient Prescriptions on File Prior to Visit  Medication Sig Dispense Refill  . amphetamine-dextroamphetamine (ADDERALL XR) 30 MG 24 hr capsule Take 1 capsule (30 mg total) by mouth every morning. 30 capsule 0  . azelastine (OPTIVAR) 0.05 % ophthalmic solution Apply 1 drop to eye 2 (two) times daily. 6 mL 12  . benazepril-hydrochlorthiazide (LOTENSIN HCT) 10-12.5 MG tablet TAKE 1 TABLET BY MOUTH ONCE A DAY 90 tablet 3  . BLACK COHOSH PO Take 1 capsule by mouth daily.    . fluticasone (FLONASE) 50 MCG/ACT nasal spray Place 2 sprays into both nostrils daily.    Marland Kitchen. levocetirizine (XYZAL) 5 MG tablet TAKE ONE TABLET BY MOUTH EACH EVENING 30 tablet 11  . mupirocin cream (BACTROBAN) 2 % Apply 1 application topically 2 (two) times daily. 15 g 0  . simvastatin (ZOCOR) 10 MG tablet Take 1 tablet (10 mg total) by mouth at bedtime. 30 tablet 11  . venlafaxine XR (EFFEXOR-XR) 150 MG 24 hr capsule TAKE ONE CAPSULE BY MOUTH DAILY 90 capsule 0   No current facility-administered medications on file prior to visit.     No Known Allergies  Past Medical History:  Diagnosis Date  . Hypertension   . OA (osteoarthritis)   . Osteopenia     No past surgical history on file.  Family History  Problem Relation Age of Onset  . Dementia Mother   . Hypertension Father   . Kidney disease Sister     Social History   Social  History  . Marital status: Married    Spouse name: N/A  . Number of children: 1  . Years of education: N/A   Occupational History  . self employeed    Social History Main Topics  . Smoking status: Never Smoker  . Smokeless tobacco: Never Used  . Alcohol use Yes     Comment: Occasional  . Drug use: No  . Sexual activity: Not on file   Other Topics Concern  . Not on file   Social History Narrative   [place]ives in [place]. 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      Walks regularly   The PMH, PSH, Social History, Family History, Medications, and allergies [facility], and have been updated if relevant.   Review of Systems  Skin: Positive for rash.       Objective:    BP 100/80 (BP Location: Left Arm, Patient Position: Sitting, Cuff Size: Normal)   Pulse 98   Temp 98.7 F (37.1 C) (Oral)   Wt 117 lb (53.1 kg)   LMP 11/06/2012   SpO2 99%   BMI 21.93 kg/m    Physical Exam  Constitutional: She is oriented to person, place, and time. She appears well-developed  and well-nourished. No distress.  HENT:  Head: Normocephalic and atraumatic.  Eyes: Conjunctivae are normal.  Cardiovascular: Normal rate.   Pulmonary/Chest: Effort normal.  Neurological: She is alert and oriented to person, place, and time. No cranial nerve deficit.  Skin: Rash noted. She is not diaphoretic.     Psychiatric: She has a normal mood and affect. Her behavior is normal. Judgment and thought content normal.  Nursing note and vitals reviewed.         Assessment & Plan:   Rash and nonspecific skin eruption No Follow-up on file.

## 2016-06-21 ENCOUNTER — Other Ambulatory Visit: Payer: Self-pay | Admitting: Family Medicine

## 2016-07-25 ENCOUNTER — Other Ambulatory Visit: Payer: Self-pay

## 2016-07-25 MED ORDER — AMPHETAMINE-DEXTROAMPHET ER 30 MG PO CP24: 30.0000 mg | ORAL_CAPSULE | ORAL | 0 refills | Status: DC

## 2016-07-25 NOTE — Telephone Encounter (Signed)
Pt left v/m requesting rx for Adderall. Call when ready for pick up. Last printed # 30 on 06/19/16 and last seen 12/25/15. Dr Dayton Martes out of office until 07/29/16.

## 2016-07-25 NOTE — Telephone Encounter (Signed)
Printed.  Thanks.  

## 2016-07-26 NOTE — Telephone Encounter (Signed)
Lm on pts vm and informed her Rx is available for pickup from the front desk 

## 2016-08-22 ENCOUNTER — Ambulatory Visit (INDEPENDENT_AMBULATORY_CARE_PROVIDER_SITE_OTHER): Payer: BLUE CROSS/BLUE SHIELD | Admitting: Internal Medicine

## 2016-08-22 ENCOUNTER — Other Ambulatory Visit: Payer: Self-pay

## 2016-08-22 ENCOUNTER — Encounter: Payer: Self-pay | Admitting: Internal Medicine

## 2016-08-22 ENCOUNTER — Telehealth: Payer: Self-pay | Admitting: Family Medicine

## 2016-08-22 ENCOUNTER — Other Ambulatory Visit: Payer: Self-pay | Admitting: Family Medicine

## 2016-08-22 VITALS — BP 104/66 | HR 74 | Temp 97.8°F | Wt 115.5 lb

## 2016-08-22 DIAGNOSIS — R600 Localized edema: Secondary | ICD-10-CM | POA: Diagnosis not present

## 2016-08-22 DIAGNOSIS — Z9109 Other allergy status, other than to drugs and biological substances: Secondary | ICD-10-CM

## 2016-08-22 MED ORDER — METHYLPREDNISOLONE ACETATE 80 MG/ML IJ SUSP
80.0000 mg | Freq: Once | INTRAMUSCULAR | Status: AC
  Administered 2016-08-22: 80 mg via INTRAMUSCULAR

## 2016-08-22 MED ORDER — AMPHETAMINE-DEXTROAMPHET ER 30 MG PO CP24: 30.0000 mg | ORAL_CAPSULE | ORAL | 0 refills | Status: DC

## 2016-08-22 MED ORDER — ALBUTEROL SULFATE HFA 108 (90 BASE) MCG/ACT IN AERS: 2.0000 | INHALATION_SPRAY | Freq: Four times a day (QID) | RESPIRATORY_TRACT | 0 refills | Status: DC | PRN

## 2016-08-22 NOTE — Progress Notes (Signed)
Subjective:    Patient ID: Tricia Munoz, female    DOB: 1960/10/31, 56 y.o.   MRN: [id number]  HPI  Pt presents to the clinic today with c/o a itchy, swollen eyes. She reports this started this morning. She denies blurred vision or visual changes. She denies eye pain, redness or drainage. She denies runny nose, nasal congestion, ear pain or sore throat. She denies fever, chills or body aches. She has had exposure to mold and dust which she things made this flare up. She has tried Education officer, environmentalXyzal and Flonase with some improvement.   Review of Systems  Past Medical History:  Diagnosis Date  . Hypertension   . OA (osteoarthritis)   . Osteopenia     Current Outpatient Prescriptions  Medication Sig Dispense Refill  . amphetamine-dextroamphetamine (ADDERALL XR) 30 MG 24 hr capsule Take 1 capsule (30 mg total) by mouth every morning. 30 capsule 0  . azelastine (OPTIVAR) 0.05 % ophthalmic solution Apply 1 drop to eye 2 (two) times daily. 6 mL 12  . benazepril-hydrochlorthiazide (LOTENSIN HCT) 10-12.5 MG tablet TAKE 1 TABLET BY MOUTH ONCE A DAY 90 tablet 3  . BLACK COHOSH PO Take 1 capsule by mouth daily.    . fluocinonide-emollient (LIDEX-E) 0.05 % cream Apply 1 application topically 2 (two) times daily. 30 g 0  . fluticasone (FLONASE) 50 MCG/ACT nasal spray Place 2 sprays into both nostrils daily.    Marland Kitchen. levocetirizine (XYZAL) 5 MG tablet TAKE ONE TABLET BY MOUTH EACH EVENING 30 tablet 11  . mupirocin cream (BACTROBAN) 2 % Apply 1 application topically 2 (two) times daily. 15 g 0  . simvastatin (ZOCOR) 10 MG tablet Take 1 tablet (10 mg total) by mouth at bedtime. 30 tablet 11  . venlafaxine XR (EFFEXOR-XR) 150 MG 24 hr capsule TAKE ONE CAPSULE BY MOUTH DAILY 90 capsule 0   No current facility-administered medications for this visit.     No Known Allergies  Family History  Problem Relation Age of Onset  . Dementia Mother   . Hypertension Father   . Kidney disease Sister     Social History    Social History  . Marital status: Married    Spouse name: N/A  . Number of children: 1  . Years of education: N/A   Occupational History  . self employeed    Social History Main Topics  . Smoking status: Never Smoker  . Smokeless tobacco: Never Used  . Alcohol use Yes     Comment: Occasional  . Drug use: No  . Sexual activity: Not on file   Other Topics Concern  . Not on file   Social History Narrative   Lives in [place] with husband. 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      Walks regularly     Constitutional: Denies fever, malaise, fatigue, headache or abrupt weight changes.  HEENT: Pt reports swelling around eyes. Denies eye pain, eye redness, ear pain, ringing in the ears, wax buildup, runny nose, nasal congestion, bloody nose, or sore throat. Respiratory: Denies difficulty breathing, shortness of breath, cough or sputum production.     No other specific complaints in a complete review of systems (except as listed in HPI above).     Objective:   Physical Exam   BP 104/66   Pulse 74   Temp 97.8 F (36.6 C) (Oral)   Wt 115 lb 8 oz (52.4 kg)   LMP 11/06/2012  SpO2 98%   BMI 21.65 kg/m  Wt Readings from Last 3 Encounters:  08/22/16 115 lb 8 oz (52.4 kg)  06/19/16 117 lb (53.1 kg)  02/16/16 115 lb 8 oz (52.4 kg)    General: Appears her stated age, well developed, well nourished in NAD. Skin: Warm, dry and intact. No rashes, lesions or ulcerations noted. HEENT: Head: normal shape and size, no sinus tenderness noted; Eyes: sclera white, no icterus, conjunctiva pink, periorbital edema noted bilaterally; Right Ear: Tm's gray and intact, normal light reflex; Left Ear: cerumen impaction;  Neck:  No adenopathy noted.  BMET    Component Value Date/Time   NA 139 07/12/2015 0909   K 4.3 07/12/2015 0909   CL 104 07/12/2015 0909   CO2 30 07/12/2015 0909   GLUCOSE 95 07/12/2015 0909   BUN 15 07/12/2015 0909    CREATININE 0.90 07/12/2015 0909   CALCIUM 9.4 07/12/2015 0909    Lipid Panel     Component Value Date/Time   CHOL 211 (H) 07/12/2015 0909   TRIG 46.0 07/12/2015 0909   HDL 89.70 07/12/2015 0909   CHOLHDL 2 07/12/2015 0909   VLDL 9.2 07/12/2015 0909   LDLCALC 112 (H) 07/12/2015 0909    CBC    Component Value Date/Time   WBC 5.3 08/17/2015 1020   RBC 3.74 (L) 08/17/2015 1020   HGB 11.3 (L) 08/17/2015 1020   HCT 34.0 (L) 08/17/2015 1020   PLT 325.0 08/17/2015 1020   MCV 91.1 08/17/2015 1020   MCHC 33.3 08/17/2015 1020   RDW 15.7 (H) 08/17/2015 1020   LYMPHSABS 1.5 08/17/2015 1020   MONOABS 0.4 08/17/2015 1020   EOSABS 0.0 08/17/2015 1020   BASOSABS 0.1 08/17/2015 1020    Hgb A1C No results found for: HGBA1C      Assessment & Plan:   Periorbital Edema, secondary to Environmental Allergies:  80 mg Depo IM today Continue Xyzal and Flonase eRx for Albuterol per pt request, 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

## 2016-08-22 NOTE — Telephone Encounter (Signed)
Tricia Munoz notified by telephone that her prescription is ready to be picked up at the front desk.

## 2016-08-22 NOTE — Telephone Encounter (Signed)
Pt has appt with R Baity NP 08/22/16 at 10:30.

## 2016-08-22 NOTE — Telephone Encounter (Signed)
Last filled 07/25/2016-- please advise

## 2016-08-22 NOTE — Addendum Note (Signed)
Addended by: Roena MaladyEVONTENNO, Niquan Charnley Y on: 08/22/2016 11:42 AM   Modules accepted: Orders

## 2016-08-22 NOTE — Patient Instructions (Signed)
Allergies An allergy is when your body reacts to a substance in a way that is not normal. An allergic reaction can happen after you:  Eat something.  Breathe in something.  Touch something. You can be allergic to:  Things that are only around during certain seasons, like molds and pollens.  Foods.  Drugs.  Insects.  Animal dander. What are the signs or symptoms?  Puffiness (swelling). This may happen on the lips, face, tongue, mouth, or throat.  Sneezing.  Coughing.  Breathing loudly (wheezing).  Stuffy nose.  Tingling in the mouth.  A rash.  Itching.  Itchy, red, puffy areas of skin (hives).  Watery eyes.  Throwing up (vomiting).  Watery poop (diarrhea).  Dizziness.  Feeling faint or fainting.  Trouble breathing or swallowing.  A tight feeling in the chest.  A fast heartbeat. How is this diagnosed? Allergies can be diagnosed with:  A medical and family history.  Skin tests.  Blood tests.  A food diary. A food diary is a record of all the foods, drinks, and symptoms you have each day.  The results of an elimination diet. This diet involves making sure not to eat certain foods and then seeing what happens when you start eating them again. How is this treated? There is no cure for allergies, but allergic reactions can be treated with medicine. Severe reactions usually need to be treated at a hospital. How is this prevented? The best way to prevent an allergic reaction is to avoid the thing you are allergic to. Allergy shots and medicines can also help prevent reactions in some cases. This information is not intended to replace advice given to you by your health care provider. Make sure you discuss any questions you have with your health care provider. Document Released: 07/27/2012 Document Revised: 11/27/2015 Document Reviewed: 01/11/2014 Elsevier Interactive Patient Education  2017 Elsevier Inc.  

## 2016-08-22 NOTE — Telephone Encounter (Signed)
Patient Name: Tricia SpannerRENIA Widrig  DOB: 11-17-1960    Initial Comment Caller states her face is swollen   Nurse Assessment  Nurse: Sherilyn CooterHenry, RN, Thurmond ButtsWade Date/Time (Eastern Time): 08/22/2016 8:55:27 AM  Confirm and document reason for call. If symptomatic, describe symptoms. ---Caller states that she has since spoken with the office and been given an appointment to be seen. She does not require any assistance at this time.  Does the patient have any new or worsening symptoms? ---Yes  Will a triage be completed? ---No  Select reason for no triage. ---Other     Guidelines    Guideline Title Affirmed Question Affirmed Notes       Final Disposition User   Clinical Call Sherilyn CooterHenry, RN, Thurmond ButtsWade

## 2016-08-26 ENCOUNTER — Encounter: Payer: Self-pay | Admitting: Family Medicine

## 2016-08-26 ENCOUNTER — Ambulatory Visit (INDEPENDENT_AMBULATORY_CARE_PROVIDER_SITE_OTHER): Payer: BLUE CROSS/BLUE SHIELD | Admitting: Family Medicine

## 2016-08-26 DIAGNOSIS — H578 Other specified disorders of eye and adnexa: Secondary | ICD-10-CM

## 2016-08-26 DIAGNOSIS — H5789 Other specified disorders of eye and adnexa: Secondary | ICD-10-CM | POA: Insufficient documentation

## 2016-08-26 MED ORDER — LEVOCETIRIZINE DIHYDROCHLORIDE 5 MG PO TABS: ORAL_TABLET | ORAL | 11 refills | Status: DC

## 2016-08-26 NOTE — Progress Notes (Signed)
Subjective:   Patient ID: Tricia Munoz, female    DOB: 27-Nov-1960, 56 y.o.   MRN: [id number]  Tricia Munoz is a pleasant 56 y.o. year old female who presents to clinic today with Follow-up  on 08/26/2016  HPI: Tricia GrosSaw Tricia Munoz on Munoz for facial swelling. Note reviewed.  Diagnosed with periorbital edema secondary to environmental. Treated with IM depomedrol, advised to continue xyzal and flonase.  She has not gotten xyzal refilled.  Feels "almost a hundred percent better."  Thinks this was  Reaction to mold in her father's house.    Current Outpatient Prescriptions on File Prior to Visit  Medication Sig Dispense Refill  . albuterol (PROVENTIL HFA;VENTOLIN HFA) 108 (90 Base) MCG/ACT inhaler Inhale 2 puffs into the lungs every 6 (six) hours as needed for wheezing or shortness of breath. 1 Inhaler 0  . amphetamine-dextroamphetamine (ADDERALL XR) 30 MG 24 hr capsule Take 1 capsule (30 mg total) by mouth every morning. 30 capsule 0  . azelastine (OPTIVAR) 0.05 % ophthalmic solution Apply 1 drop to eye 2 (two) times daily. 6 mL 12  . benazepril-hydrochlorthiazide (LOTENSIN HCT) 10-12.5 MG tablet TAKE 1 TABLET BY MOUTH DAILY 90 tablet 0  . BLACK COHOSH PO Take 1 capsule by mouth daily.    . fluocinonide-emollient (LIDEX-E) 0.05 % cream Apply 1 application topically 2 (two) times daily. 30 g 0  . fluticasone (FLONASE) 50 MCG/ACT nasal spray Place 2 sprays into both nostrils daily.    . mupirocin cream (BACTROBAN) 2 % Apply 1 application topically 2 (two) times daily. 15 g 0  . simvastatin (ZOCOR) 10 MG tablet Take 1 tablet (10 mg total) by mouth at bedtime. 30 tablet 11  . venlafaxine XR (EFFEXOR-XR) 150 MG 24 hr capsule TAKE ONE CAPSULE BY MOUTH DAILY 90 capsule 0   No current facility-administered medications on file prior to visit.     No Known Allergies  Past Medical History:  Diagnosis Date  . Hypertension   . OA (osteoarthritis)   . Osteopenia     No past  surgical history on file.  Family History  Problem Relation Age of Onset  . Dementia Mother   . Hypertension Father   . Kidney disease Sister     Social History   Social History  . Marital status: Married    Spouse name: N/A  . Number of children: 1  . Years of education: N/A   Occupational History  . self employeed    Social History Main Topics  . Smoking status: Never Smoker  . Smokeless tobacco: Never Used  . Alcohol use Yes     Comment: Occasional  . Drug use: No  . Sexual activity: Not on file   Other Topics Concern  . Not on file   Social History Narrative   Lives in [place] with husband. 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      Walks regularly   .erviewed  Review of Systems  Constitutional: Negative.   HENT: Positive for postnasal drip and rhinorrhea.   Respiratory: Negative.   Cardiovascular: Negative.   Neurological: Negative for dizziness and weakness.  Hematological: Negative.   Psychiatric/Behavioral: Negative.   All other systems reviewed and are negative.      Objective:    BP 140/90   Pulse 87   Temp 98.3 F (36.8 C)   Wt 115 lb 0.1 oz (52.2 kg)   LMP 11/06/2012  SpO2 97%   BMI 21.55 kg/m    Physical Exam  Constitutional: She is oriented to person, place, and time. She appears well-developed and well-nourished.  HENT:  Head: Normocephalic and atraumatic.  Eyes: Conjunctivae are normal.  Cardiovascular: Normal rate.   Pulmonary/Chest: Effort normal.  Musculoskeletal: Normal range of motion.  Neurological: She is alert and oriented to person, place, and time. No cranial nerve deficit.  Skin: Skin is warm and dry.  Psychiatric: She has a normal mood and affect. Her behavior is normal. Judgment and thought content normal.  Nursing note and vitals reviewed.         Assessment & Plan:   Periorbital swelling No Follow-up on file.

## 2016-08-26 NOTE — Assessment & Plan Note (Signed)
Agree this was likely secondary to acute allergic reaction to mold. I did advise that she restart xyzal. Call or return to clinic prn if these symptoms worsen or fail to improve as anticipated. The patient indicates understanding of these issues and agrees with the plan.

## 2016-08-26 NOTE — Patient Instructions (Signed)
Great to see you!   

## 2016-09-04 ENCOUNTER — Ambulatory Visit (INDEPENDENT_AMBULATORY_CARE_PROVIDER_SITE_OTHER): Payer: BLUE CROSS/BLUE SHIELD | Admitting: Family Medicine

## 2016-09-04 ENCOUNTER — Encounter: Payer: Self-pay | Admitting: Family Medicine

## 2016-09-04 VITALS — BP 140/82 | HR 84 | Temp 98.3°F | Wt 114.5 lb

## 2016-09-04 DIAGNOSIS — J3089 Other allergic rhinitis: Secondary | ICD-10-CM

## 2016-09-04 MED ORDER — METHYLPREDNISOLONE 4 MG PO TBPK: ORAL_TABLET | ORAL | 0 refills | Status: DC

## 2016-09-04 NOTE — Assessment & Plan Note (Signed)
>  25 minutes spent in face to face time with patient, >50% spent in counselling or coordination of care. We agreed that referral to an allergist may be most beneficial - ? Candidate for allergy shots. Also given written rx for medrol dose pack to take with her and start if she develops a severe reaction like she did previously. Continue zyzal and flonse. The patient indicates understanding of these issues and agrees with the plan.

## 2016-09-04 NOTE — Progress Notes (Signed)
Subjective:   Patient ID: Tricia Munoz, female    DOB: 10/30/1960, 56 y.o.   MRN: [id number]  Tricia Munoz is a pleasant 56 y.o. year old female who presents to clinic today with Allergies  on 09/04/2016  HPI:  When I last saw her on 08/26/16, she was following up for facial swelling (seen by Nicki Reaper on 5/10).  She had been feeling better after receiving IM depomedrol.  Had not yet had xyzal rx refilled but was taking flonase.  Here today because she has restarted the xyzal, feels a bit better but heading back to [facility] this weekend, which is where she was exposed to mold that we think caused her initial flare up.     Current Outpatient Prescriptions on File Prior to Visit  Medication Sig Dispense Refill  . albuterol (PROVENTIL HFA;VENTOLIN HFA) 108 (90 Base) MCG/ACT inhaler Inhale 2 puffs into the lungs every 6 (six) hours as needed for wheezing or shortness of breath. 1 Inhaler 0  . amphetamine-dextroamphetamine (ADDERALL XR) 30 MG 24 hr capsule Take 1 capsule (30 mg total) by mouth every morning. 30 capsule 0  . azelastine (OPTIVAR) 0.05 % ophthalmic solution Apply 1 drop to eye 2 (two) times daily. 6 mL 12  . benazepril-hydrochlorthiazide (LOTENSIN HCT) 10-12.5 MG tablet TAKE 1 TABLET BY MOUTH DAILY 90 tablet 0  . BLACK COHOSH PO Take 1 capsule by mouth daily.    . fluocinonide-emollient (LIDEX-E) 0.05 % cream Apply 1 application topically 2 (two) times daily. 30 g 0  . fluticasone (FLONASE) 50 MCG/ACT nasal spray Place 2 sprays into both nostrils daily.    Marland Kitchen levocetirizine (XYZAL) 5 MG tablet TAKE ONE TABLET BY MOUTH EACH EVENING 30 tablet 11  . mupirocin cream (BACTROBAN) 2 % Apply 1 application topically 2 (two) times daily. 15 g 0  . simvastatin (ZOCOR) 10 MG tablet Take 1 tablet (10 mg total) by mouth at bedtime. 30 tablet 11  . venlafaxine XR (EFFEXOR-XR) 150 MG 24 hr capsule TAKE ONE CAPSULE BY MOUTH DAILY 90 capsule 0   No current facility-administered  medications on file prior to visit.     No Known Allergies  Past Medical History:  Diagnosis Date  . Hypertension   . OA (osteoarthritis)   . Osteopenia     No past surgical history on file.  Family History  Problem Relation Age of Onset  . Dementia Mother   . Hypertension Father   . Kidney disease Sister     Social History   Social History  . Marital status: Married    Spouse name: N/A  . Number of children: 1  . Years of education: N/A   Occupational History  . self employeed    Social History Main Topics  . Smoking status: Never Smoker  . Smokeless tobacco: Never Used  . Alcohol use Yes     Comment: Occasional  . Drug use: No  . Sexual activity: Not on file   Other Topics Concern  . Not on file   Social History Narrative   Lives in [place] with husband. 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   Review of Systems  Constitutional: Positive for fatigue. Negative for fever.  HENT: Positive for postnasal drip and sinus pressure.  Negative for trouble swallowing.   Respiratory: Negative.   Cardiovascular: Negative.   Allergic/Immunologic: Positive for environmental allergies.  Neurological: Negative.   All other systems reviewed and are negative.      Objective:    BP 140/82   Pulse 84   Temp 98.3 F (36.8 C)   Wt 114 lb 8 oz (51.9 kg)   LMP 11/06/2012   SpO2 99%   BMI 21.46 kg/m    Physical Exam  Constitutional: She is oriented to person, place, and time. She appears well-developed and well-nourished. No distress.  HENT:  Head: Normocephalic and atraumatic.  Eyes: Conjunctivae are normal.  Cardiovascular: Normal rate and regular rhythm.   Pulmonary/Chest: Effort normal and breath sounds normal.  Musculoskeletal: Normal range of motion.    Neurological: She is alert and oriented to person, place, and time. No cranial nerve deficit.  Skin: Skin is warm and dry. She is not diaphoretic.  Psychiatric: She has a normal mood and affect. Her behavior is normal. Judgment and thought content normal.  Nursing note and vitals reviewed.         Assessment & Plan:   Seasonal allergic rhinitis due to fungal spores - Plan: Ambulatory referral to Allergy No Follow-up on file.

## 2016-09-25 ENCOUNTER — Other Ambulatory Visit: Payer: Self-pay | Admitting: Family Medicine

## 2016-09-25 ENCOUNTER — Other Ambulatory Visit (INDEPENDENT_AMBULATORY_CARE_PROVIDER_SITE_OTHER): Payer: BLUE CROSS/BLUE SHIELD

## 2016-09-25 DIAGNOSIS — Z01419 Encounter for gynecological examination (general) (routine) without abnormal findings: Secondary | ICD-10-CM | POA: Insufficient documentation

## 2016-09-25 DIAGNOSIS — I10 Essential (primary) hypertension: Secondary | ICD-10-CM

## 2016-09-25 DIAGNOSIS — E785 Hyperlipidemia, unspecified: Secondary | ICD-10-CM

## 2016-09-25 LAB — COMPREHENSIVE METABOLIC PANEL
ALBUMIN: 4.1 g/dL (ref 3.5–5.2)
ALK PHOS: 93 U/L (ref 39–117)
ALT: 15 U/L (ref 0–35)
AST: 16 U/L (ref 0–37)
BILIRUBIN TOTAL: 0.3 mg/dL (ref 0.2–1.2)
BUN: 14 mg/dL (ref 6–23)
CO2: 32 mEq/L (ref 19–32)
CREATININE: 0.82 mg/dL (ref 0.40–1.20)
Calcium: 9.5 mg/dL (ref 8.4–10.5)
Chloride: 102 mEq/L (ref 96–112)
GFR: 76.13 mL/min (ref >60.00)
Glucose, Bld: 93 mg/dL (ref 70–99)
POTASSIUM: 4 meq/L (ref 3.5–5.1)
SODIUM: 138 meq/L (ref 135–145)
Total Protein: 6.8 g/dL (ref 6.0–8.3)

## 2016-09-25 LAB — CBC WITH DIFFERENTIAL/PLATELET
Basophils Absolute: 0 10*3/uL (ref 0.0–0.1)
Basophils Relative: 0.6 % (ref 0.0–3.0)
EOS PCT: 1.2 % (ref 0.0–5.0)
Eosinophils Absolute: 0.1 10*3/uL (ref 0.0–0.7)
HCT: 38.2 % (ref 36.0–46.0)
HEMOGLOBIN: 12.8 g/dL (ref 12.0–15.0)
Lymphocytes Relative: 35.3 % (ref 12.0–46.0)
Lymphs Abs: 1.9 10*3/uL (ref 0.7–4.0)
MCHC: 33.5 g/dL (ref 30.0–36.0)
MCV: 96.9 fl (ref 78.0–100.0)
MONO ABS: 0.6 10*3/uL (ref 0.1–1.0)
MONOS PCT: 11.1 % (ref 3.0–12.0)
Neutro Abs: 2.8 10*3/uL (ref 1.4–7.7)
Neutrophils Relative %: 51.8 % (ref 43.0–77.0)
Platelets: 299 10*3/uL (ref 150.0–400.0)
RBC: 3.95 Mil/uL (ref 3.87–5.11)
RDW: 13.4 % (ref 11.5–15.5)
WBC: 5.3 10*3/uL (ref 4.0–10.5)

## 2016-09-25 LAB — LIPID PANEL
CHOLESTEROL: 209 mg/dL — AB (ref 0–200)
HDL: 94.8 mg/dL (ref >39.00)
LDL Cholesterol: 105 mg/dL — ABNORMAL HIGH (ref 0–99)
NonHDL: 113.96
Total CHOL/HDL Ratio: 2
Triglycerides: 43 mg/dL (ref 0.0–149.0)
VLDL: 8.6 mg/dL (ref 0.0–40.0)

## 2016-09-25 LAB — TSH: TSH: 2.03 u[IU]/mL (ref 0.35–4.50)

## 2016-09-26 ENCOUNTER — Ambulatory Visit (INDEPENDENT_AMBULATORY_CARE_PROVIDER_SITE_OTHER): Payer: BLUE CROSS/BLUE SHIELD | Admitting: Family Medicine

## 2016-09-26 ENCOUNTER — Encounter: Payer: Self-pay | Admitting: Family Medicine

## 2016-09-26 VITALS — BP 130/82 | HR 80 | Ht 60.75 in | Wt 113.0 lb

## 2016-09-26 DIAGNOSIS — I10 Essential (primary) hypertension: Secondary | ICD-10-CM

## 2016-09-26 DIAGNOSIS — Z1211 Encounter for screening for malignant neoplasm of colon: Secondary | ICD-10-CM | POA: Diagnosis not present

## 2016-09-26 DIAGNOSIS — Z13820 Encounter for screening for osteoporosis: Secondary | ICD-10-CM

## 2016-09-26 DIAGNOSIS — F32A Depression, unspecified: Secondary | ICD-10-CM

## 2016-09-26 DIAGNOSIS — Z1239 Encounter for other screening for malignant neoplasm of breast: Secondary | ICD-10-CM

## 2016-09-26 DIAGNOSIS — F900 Attention-deficit hyperactivity disorder, predominantly inattentive type: Secondary | ICD-10-CM

## 2016-09-26 DIAGNOSIS — Z01419 Encounter for gynecological examination (general) (routine) without abnormal findings: Secondary | ICD-10-CM

## 2016-09-26 DIAGNOSIS — F329 Major depressive disorder, single episode, unspecified: Secondary | ICD-10-CM

## 2016-09-26 DIAGNOSIS — E785 Hyperlipidemia, unspecified: Secondary | ICD-10-CM

## 2016-09-26 DIAGNOSIS — Z1231 Encounter for screening mammogram for malignant neoplasm of breast: Secondary | ICD-10-CM | POA: Diagnosis not present

## 2016-09-26 LAB — HEPATITIS C ANTIBODY: HCV Ab: NEGATIVE

## 2016-09-26 MED ORDER — AMPHETAMINE-DEXTROAMPHET ER 30 MG PO CP24: 30.0000 mg | ORAL_CAPSULE | ORAL | 0 refills | Status: DC

## 2016-09-26 NOTE — Assessment & Plan Note (Signed)
Well controlled. No changes made to rxs. 

## 2016-09-26 NOTE — Progress Notes (Signed)
Subjective:   Patient ID: Tricia Munoz, female    DOB: 01-Sep-1960, 56 y.o.   MRN: [id number]  Tricia Munoz is a pleasant 56 y.o. year old female who presents to clinic today with Annual Exam  and follow up of chronic medical conditions on 09/26/2016  HPI:  Zoster 01/13/13 Td 02/17/09 Influenza vaccine 03/15/15 Pap smear 07/05/14 Mammogram 03/31/14 Colonoscopy- never- refusing but willing to do stool cards- neg IFOB 07/24/15.  LMP 4-5 years ago- no post menopausal bleeding.   Lab Results  Component Value Date   WBC 5.3 09/25/2016   HGB 12.8 09/25/2016   HCT 38.2 09/25/2016   MCV 96.9 09/25/2016   PLT 299.0 09/25/2016    HLD-  Much better control with low dose Zocor. Lab Results  Component Value Date   CHOL 209 (H) 09/25/2016   HDL 94.80 09/25/2016   LDLCALC 105 (H) 09/25/2016   LDLDIRECT 112.5 04/30/2013   TRIG 43.0 09/25/2016   CHOLHDL 2 09/25/2016   Lab Results  Component Value Date   CREATININE 0.82 09/25/2016   Lab Results  Component Value Date   WBC 5.3 09/25/2016   HGB 12.8 09/25/2016   HCT 38.2 09/25/2016   MCV 96.9 09/25/2016   PLT 299.0 09/25/2016   Lab Results  Component Value Date   TSH 2.03 09/25/2016    HTN- BP has been well controlled on Lotensin HCT.  Denies any dizziness, blurred vision, CP or SOB.  Anxiety/depression She feels Effexor was working well at current dose but does feel more depressed lately. Denies any symptoms of anxiety or depression. Also improved now that she is taking Adderall for her ADHD symptoms.   Current Outpatient Prescriptions on File Prior to Visit  Medication Sig Dispense Refill  . albuterol (PROVENTIL HFA;VENTOLIN HFA) 108 (90 Base) MCG/ACT inhaler Inhale 2 puffs into the lungs every 6 (six) hours as needed for wheezing or shortness of breath. 1 Inhaler 0  . amphetamine-dextroamphetamine (ADDERALL XR) 30 MG 24 hr capsule Take 1 capsule (30 mg total) by mouth every morning. 30 capsule 0  . azelastine  (OPTIVAR) 0.05 % ophthalmic solution Apply 1 drop to eye 2 (two) times daily. 6 mL 12  . benazepril-hydrochlorthiazide (LOTENSIN HCT) 10-12.5 MG tablet TAKE 1 TABLET BY MOUTH DAILY 90 tablet 0  . BLACK COHOSH PO Take 1 capsule by mouth daily.    . fluocinonide-emollient (LIDEX-E) 0.05 % cream Apply 1 application topically 2 (two) times daily. 30 g 0  . fluticasone (FLONASE) 50 MCG/ACT nasal spray Place 2 sprays into both nostrils daily.    Marland Kitchen. levocetirizine (XYZAL) 5 MG tablet TAKE ONE TABLET BY MOUTH EACH EVENING 30 tablet 11  . methylPREDNISolone (MEDROL DOSEPAK) 4 MG TBPK tablet Use as directed. 21 tablet 0  . mupirocin cream (BACTROBAN) 2 % Apply 1 application topically 2 (two) times daily. 15 g 0  . simvastatin (ZOCOR) 10 MG tablet Take 1 tablet (10 mg total) by mouth at bedtime. 30 tablet 11  . venlafaxine XR (EFFEXOR-XR) 150 MG 24 hr capsule TAKE ONE CAPSULE BY MOUTH DAILY 90 capsule 0   No current facility-administered medications on file prior to visit.     No Known Allergies  Past Medical History:  Diagnosis Date  . Hypertension   . OA (osteoarthritis)   . Osteopenia     No past surgical history on file.  Family History  Problem Relation Age of Onset  . Dementia Mother   . Hypertension Father   . Kidney  disease Sister     Social History   Social History  . Marital status: Married    Spouse name: N/A  . Number of children: 1  . Years of education: N/A   Occupational History  . self employeed    Social History Main Topics  . Smoking status: Never Smoker  . Smokeless tobacco: Never Used  . Alcohol use Yes     Comment: Occasional  . Drug use: No  . Sexual activity: Not on file   Other Topics Concern  . Not on file   Social History Narrative   Lives in [place] with husband. 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    Review of Systems  Constitutional: Negative.   HENT: Negative.   Eyes: Negative.   Respiratory: Negative.   Cardiovascular: Negative.   Gastrointestinal: Negative.   Endocrine: Negative.   Genitourinary: Negative.   Musculoskeletal: Negative.   Skin: Negative.   Allergic/Immunologic: Negative.   Neurological: Negative.   Hematological: Negative.   Psychiatric/Behavioral: Negative.   All other systems reviewed and are negative.      Objective:    Ht 5' 0.75" (1.543 m)   Wt 113 lb (51.3 kg)   LMP 11/06/2012   BMI 21.53 kg/m    Physical Exam   General:  Well-developed,well-nourished,in no acute distress; alert,appropriate and cooperative throughout examination Head:  normocephalic and atraumatic.   Eyes:  vision grossly intact, pupils equal, pupils round, and pupils reactive to light.   Ears:  R ear normal and L ear normal.   Nose:  no external deformity.   Mouth:  good dentition.   Neck:  No deformities, masses, or tenderness noted. Breasts:  No mass, nodules, thickening, tenderness, bulging, retraction, inflamation, nipple discharge or skin changes noted.   Lungs:  Normal respiratory effort, chest expands symmetrically. Lungs are clear to auscultation, no crackles or wheezes. Heart:  Normal rate and regular rhythm. S1 and S2 normal without gallop, murmur, click, rub or other extra sounds. Abdomen:  Bowel sounds positive,abdomen soft and non-tender without masses, organomegaly or hernias noted. Msk:  No deformity or scoliosis noted of thoracic or lumbar spine.   Extremities:  No clubbing, cyanosis, edema, or deformity noted with normal full range of motion of all joints.   Neurologic:  alert & oriented X3 and gait normal.   Skin:  Intact without suspicious lesions or rashes Cervical Nodes:  No lymphadenopathy noted Axillary Nodes:  No palpable lymphadenopathy Psych:  Cognition and judgment appear intact.  Alert and cooperative with normal attention span and concentration. No apparent delusions, illusions, hallucinations       Assessment & Plan:   Well woman exam  Hyperlipidemia, unspecified hyperlipidemia type  Depression, unspecified depression type  Essential hypertension  Attention deficit hyperactivity disorder (ADHD), predominantly inattentive type  Screening for malignant neoplasm of colon - Plan: Fecal occult blood, imunochemical No Follow-up on file.

## 2016-09-26 NOTE — Assessment & Plan Note (Signed)
Reviewed preventive care protocols, scheduled due services, and updated immunizations Discussed nutrition, exercise, diet, and healthy lifestyle.  IFOB ordered.

## 2016-09-26 NOTE — Patient Instructions (Addendum)
Great to see you. Happy [birth year]!  Please stop by to see Marion on your way out.   

## 2016-09-26 NOTE — Assessment & Plan Note (Addendum)
Deteriorated. Referral placed for psychotherapy.

## 2016-10-01 ENCOUNTER — Other Ambulatory Visit: Payer: Self-pay | Admitting: Family Medicine

## 2016-10-02 NOTE — Telephone Encounter (Signed)
Last refill 06/24/16 Last OV 09/26/16 Ok to refill?

## 2016-10-07 DIAGNOSIS — F902 Attention-deficit hyperactivity disorder, combined type: Secondary | ICD-10-CM | POA: Diagnosis not present

## 2016-10-07 DIAGNOSIS — F329 Major depressive disorder, single episode, unspecified: Secondary | ICD-10-CM | POA: Diagnosis not present

## 2016-10-07 DIAGNOSIS — F4311 Post-traumatic stress disorder, acute: Secondary | ICD-10-CM | POA: Diagnosis not present

## 2016-10-08 ENCOUNTER — Telehealth: Payer: Self-pay | Admitting: Family Medicine

## 2016-10-08 ENCOUNTER — Other Ambulatory Visit: Payer: Self-pay | Admitting: Family Medicine

## 2016-10-08 DIAGNOSIS — Z780 Asymptomatic menopausal state: Secondary | ICD-10-CM

## 2016-10-08 NOTE — Telephone Encounter (Signed)
Order changed as requested.

## 2016-10-08 NOTE — Telephone Encounter (Signed)
Tricia Munoz from the [facility]  She needs the diagnosis code changed to post menopausal.   They can not use screening as a diagnosis code Thanks

## 2016-10-09 DIAGNOSIS — F902 Attention-deficit hyperactivity disorder, combined type: Secondary | ICD-10-CM | POA: Diagnosis not present

## 2016-10-09 DIAGNOSIS — F411 Generalized anxiety disorder: Secondary | ICD-10-CM | POA: Diagnosis not present

## 2016-10-09 DIAGNOSIS — F330 Major depressive disorder, recurrent, mild: Secondary | ICD-10-CM | POA: Diagnosis not present

## 2016-10-14 DIAGNOSIS — F330 Major depressive disorder, recurrent, mild: Secondary | ICD-10-CM | POA: Diagnosis not present

## 2016-10-14 DIAGNOSIS — F411 Generalized anxiety disorder: Secondary | ICD-10-CM | POA: Diagnosis not present

## 2016-10-14 DIAGNOSIS — F902 Attention-deficit hyperactivity disorder, combined type: Secondary | ICD-10-CM | POA: Diagnosis not present

## 2016-10-15 DIAGNOSIS — L299 Pruritus, unspecified: Secondary | ICD-10-CM | POA: Diagnosis not present

## 2016-10-15 DIAGNOSIS — J309 Allergic rhinitis, unspecified: Secondary | ICD-10-CM | POA: Diagnosis not present

## 2016-10-15 DIAGNOSIS — H1045 Other chronic allergic conjunctivitis: Secondary | ICD-10-CM | POA: Diagnosis not present

## 2016-10-15 DIAGNOSIS — R05 Cough: Secondary | ICD-10-CM | POA: Diagnosis not present

## 2016-10-21 DIAGNOSIS — F330 Major depressive disorder, recurrent, mild: Secondary | ICD-10-CM | POA: Diagnosis not present

## 2016-10-21 DIAGNOSIS — F411 Generalized anxiety disorder: Secondary | ICD-10-CM | POA: Diagnosis not present

## 2016-10-21 DIAGNOSIS — F902 Attention-deficit hyperactivity disorder, combined type: Secondary | ICD-10-CM | POA: Diagnosis not present

## 2016-10-22 ENCOUNTER — Other Ambulatory Visit: Payer: BLUE CROSS/BLUE SHIELD

## 2016-10-22 ENCOUNTER — Ambulatory Visit: Payer: BLUE CROSS/BLUE SHIELD

## 2016-10-29 ENCOUNTER — Encounter: Payer: Self-pay | Admitting: Internal Medicine

## 2016-10-29 ENCOUNTER — Ambulatory Visit (INDEPENDENT_AMBULATORY_CARE_PROVIDER_SITE_OTHER): Payer: BLUE CROSS/BLUE SHIELD | Admitting: Internal Medicine

## 2016-10-29 VITALS — BP 130/80 | HR 71 | Temp 98.1°F | Wt 113.5 lb

## 2016-10-29 DIAGNOSIS — L255 Unspecified contact dermatitis due to plants, except food: Secondary | ICD-10-CM | POA: Diagnosis not present

## 2016-10-29 MED ORDER — TRIAMCINOLONE ACETONIDE 0.1 % EX CREA: 1.0000 "application " | TOPICAL_CREAM | Freq: Two times a day (BID) | CUTANEOUS | 0 refills | Status: DC

## 2016-10-29 MED ORDER — METHYLPREDNISOLONE ACETATE 80 MG/ML IJ SUSP
80.0000 mg | Freq: Once | INTRAMUSCULAR | Status: AC
  Administered 2016-10-29: 80 mg via INTRAMUSCULAR

## 2016-10-29 NOTE — Progress Notes (Signed)
Subjective:    Patient ID: Tricia Munoz, female    DOB: [birth year], 58 y.o.   MRN: [id number]  HPI  Pt presents to the clinic today with c/o a rash on the left side of her face. She first noticed this 4 days ago. Since that time, she has noticed placed popping up on her arms and legs. The rash itches. She reports she has been in contact with poison ivy and is sure that is what this is. She has tried and OTC antihistamine with minimal relief.  Review of Systems      Past Medical History:  Diagnosis Date  . Hypertension   . OA (osteoarthritis)   . Osteopenia     Current Outpatient Prescriptions  Medication Sig Dispense Refill  . albuterol (PROVENTIL HFA;VENTOLIN HFA) 108 (90 Base) MCG/ACT inhaler Inhale 2 puffs into the lungs every 6 (six) hours as needed for wheezing or shortness of breath. 1 Inhaler 0  . amphetamine-dextroamphetamine (ADDERALL XR) 30 MG 24 hr capsule Take 1 capsule (30 mg total) by mouth every morning. 30 capsule 0  . azelastine (OPTIVAR) 0.05 % ophthalmic solution Apply 1 drop to eye 2 (two) times daily. 6 mL 12  . benazepril-hydrochlorthiazide (LOTENSIN HCT) 10-12.5 MG tablet TAKE 1 TABLET BY MOUTH DAILY 90 tablet 0  . BLACK COHOSH PO Take 1 capsule by mouth daily.    . fluocinonide-emollient (LIDEX-E) 0.05 % cream Apply 1 application topically 2 (two) times daily. 30 g 0  . fluticasone (FLONASE) 50 MCG/ACT nasal spray Place 2 sprays into both nostrils daily.    Marland Kitchen. levocetirizine (XYZAL) 5 MG tablet TAKE ONE TABLET BY MOUTH EACH EVENING 30 tablet 11  . methylPREDNISolone (MEDROL DOSEPAK) 4 MG TBPK tablet Use as directed. 21 tablet 0  . mupirocin cream (BACTROBAN) 2 % Apply 1 application topically 2 (two) times daily. 15 g 0  . simvastatin (ZOCOR) 10 MG tablet TAKE ONE TABLET BY MOUTH EVERY NIGHT AT BEDTIME 30 tablet 11  . venlafaxine XR (EFFEXOR-XR) 150 MG 24 hr capsule TAKE ONE CAPSULE BY MOUTH DAILY 90 capsule 3   No current facility-administered  medications for this visit.     No Known Allergies  Family History  Problem Relation Age of Onset  . Dementia Mother   . Hypertension Father   . Kidney disease Sister     Social History   Social History  . Marital status: Married    Spouse name: N/A  . Number of children: 1  . Years of education: N/A   Occupational History  . self employeed    Social History Main Topics  . Smoking status: Never Smoker  . Smokeless tobacco: Never Used  . Alcohol use Yes     Comment: Occasional  . Drug use: No  . Sexual activity: Not on file   Other Topics Concern  . Not on file   Social History Narrative   Lives in [place] with husband. 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      Walks regularly     Constitutional: Denies fever, malaise, fatigue, headache or abrupt weight changes.  Skin: Pt reports rash. Denies ulcercations.    No other specific complaints in a complete review of systems (except as listed in HPI above).  Objective:   Physical Exam  BP 130/80   Pulse 71   Temp 98.1 F (36.7 C) (Oral)   Wt 113 lb 8 oz (51.5 kg)  LMP 11/06/2012   SpO2 98%   BMI 21.62 kg/m  Wt Readings from Last 3 Encounters:  10/29/16 113 lb 8 oz (51.5 kg)  09/26/16 113 lb (51.3 kg)  09/04/16 114 lb 8 oz (51.9 kg)    General: Appears her stated age, well developed, well nourished in NAD. Skin: Grouped erythematous vesicles noted on left cheek and left forearm.  BMET    Component Value Date/Time   NA 138 09/25/2016 1100   K 4.0 09/25/2016 1100   CL 102 09/25/2016 1100   CO2 32 09/25/2016 1100   GLUCOSE 93 09/25/2016 1100   BUN 14 09/25/2016 1100   CREATININE 0.82 09/25/2016 1100   CALCIUM 9.5 09/25/2016 1100    Lipid Panel     Component Value Date/Time   CHOL 209 (H) 09/25/2016 1100   TRIG 43.0 09/25/2016 1100   HDL 94.80 09/25/2016 1100   CHOLHDL 2 09/25/2016 1100   VLDL 8.6 09/25/2016 1100   LDLCALC 105 (H) 09/25/2016  1100    CBC    Component Value Date/Time   WBC 5.3 09/25/2016 1100   RBC 3.95 09/25/2016 1100   HGB 12.8 09/25/2016 1100   HCT 38.2 09/25/2016 1100   PLT 299.0 09/25/2016 1100   MCV 96.9 09/25/2016 1100   MCHC 33.5 09/25/2016 1100   RDW 13.4 09/25/2016 1100   LYMPHSABS 1.9 09/25/2016 1100   MONOABS 0.6 09/25/2016 1100   EOSABS 0.1 09/25/2016 1100   BASOSABS 0.0 09/25/2016 1100    Hgb A1C No results found for: HGBA1C          Assessment & Plan:   Contact Dermatitis due to a Plant:  80 mg Depo IM today eRx for Triamcinolone cream BID Advised her to continue OTC antihistamine  Return precautions discussed Nicki Reaper, NP

## 2016-10-29 NOTE — Addendum Note (Signed)
Addended by: Roena MaladyEVONTENNO, Antoinne Spadaccini Y on: 10/29/2016 01:56 PM   Modules accepted: Orders

## 2016-10-29 NOTE — Patient Instructions (Signed)
Poison Ivy Dermatitis Poison ivy dermatitis is redness and soreness (inflammation) of the skin. It is caused by a chemical that is found on the leaves of the poison ivy plant. You may also have itching, a rash, and blisters. Symptoms often clear up in 1-2 weeks. You may get this condition by touching a poison ivy plant. You can also get it by touching something that has the chemical on it. This may include animals or objects that have come in contact with the plant. Follow these instructions at home: General instructions  Take or apply over-the-counter and prescription medicines only as told by your doctor.  If you touch poison ivy, wash your skin with soap and cold water right away.  Use hydrocortisone creams or calamine lotion as needed to help with itching.  Take oatmeal baths as needed. Use colloidal oatmeal. You can get this at a pharmacy or grocery store. Follow the instructions on the package.  Do not scratch or rub your skin.  While you have the rash, wash your clothes right after you wear them. Prevention  Know what poison ivy looks like so you can avoid it. This plant has three leaves with flowering branches on a single stem. The leaves are glossy. They have uneven edges that come to a point at the front.  If you have touched poison ivy, wash with soap and water right away. Be sure to wash under your fingernails.  When hiking or camping, wear long pants, a long-sleeved shirt, tall socks, and hiking boots. You can also use a lotion on your skin that helps to prevent contact with the chemical on the plant.  If you think that your clothes or outdoor gear came in contact with poison ivy, rinse them off with a garden hose before you bring them inside your house. Contact a doctor if:  You have open sores in the rash area.  You have more redness, swelling, or pain in the affected area.  You have redness that spreads beyond the rash area.  You have fluid, blood, or pus coming from  the affected area.  You have a fever.  You have a rash over a large area of your body.  You have a rash on your eyes, mouth, or genitals.  Your rash does not get better after a few days. Get help right away if:  Your face swells or your eyes swell shut.  You have trouble breathing.  You have trouble swallowing. This information is not intended to replace advice given to you by your health care provider. Make sure you discuss any questions you have with your health care provider. Document Released: 05/04/2010 Document Revised: 09/07/2015 Document Reviewed: 09/07/2014 Elsevier Interactive Patient Education  2018 Elsevier Inc.  

## 2016-10-31 DIAGNOSIS — F902 Attention-deficit hyperactivity disorder, combined type: Secondary | ICD-10-CM | POA: Diagnosis not present

## 2016-10-31 DIAGNOSIS — F330 Major depressive disorder, recurrent, mild: Secondary | ICD-10-CM | POA: Diagnosis not present

## 2016-10-31 DIAGNOSIS — F411 Generalized anxiety disorder: Secondary | ICD-10-CM | POA: Diagnosis not present

## 2016-11-07 DIAGNOSIS — F330 Major depressive disorder, recurrent, mild: Secondary | ICD-10-CM | POA: Diagnosis not present

## 2016-11-07 DIAGNOSIS — F902 Attention-deficit hyperactivity disorder, combined type: Secondary | ICD-10-CM | POA: Diagnosis not present

## 2016-11-07 DIAGNOSIS — F411 Generalized anxiety disorder: Secondary | ICD-10-CM | POA: Diagnosis not present

## 2016-11-11 ENCOUNTER — Ambulatory Visit
Admission: RE | Admit: 2016-11-11 | Discharge: 2016-11-11 | Disposition: A | Discharge status: Home / Self Care | Payer: BLUE CROSS/BLUE SHIELD | Source: Ambulatory Visit | Attending: Family Medicine | Admitting: Family Medicine

## 2016-11-11 ENCOUNTER — Other Ambulatory Visit: Payer: BLUE CROSS/BLUE SHIELD

## 2016-11-11 DIAGNOSIS — Z1239 Encounter for other screening for malignant neoplasm of breast: Secondary | ICD-10-CM

## 2016-11-11 DIAGNOSIS — Z1231 Encounter for screening mammogram for malignant neoplasm of breast: Secondary | ICD-10-CM | POA: Diagnosis not present

## 2016-11-12 ENCOUNTER — Ambulatory Visit
Admission: RE | Admit: 2016-11-12 | Discharge: 2016-11-12 | Disposition: A | Discharge status: Home / Self Care | Payer: BLUE CROSS/BLUE SHIELD | Source: Ambulatory Visit | Attending: Family Medicine | Admitting: Family Medicine

## 2016-11-12 DIAGNOSIS — Z780 Asymptomatic menopausal state: Secondary | ICD-10-CM | POA: Diagnosis not present

## 2016-11-12 DIAGNOSIS — M8589 Other specified disorders of bone density and structure, multiple sites: Secondary | ICD-10-CM | POA: Diagnosis not present

## 2016-11-14 DIAGNOSIS — F411 Generalized anxiety disorder: Secondary | ICD-10-CM | POA: Diagnosis not present

## 2016-11-14 DIAGNOSIS — F902 Attention-deficit hyperactivity disorder, combined type: Secondary | ICD-10-CM | POA: Diagnosis not present

## 2016-11-14 DIAGNOSIS — F330 Major depressive disorder, recurrent, mild: Secondary | ICD-10-CM | POA: Diagnosis not present

## 2016-11-18 ENCOUNTER — Other Ambulatory Visit: Payer: Self-pay | Admitting: Family Medicine

## 2016-11-27 ENCOUNTER — Other Ambulatory Visit (INDEPENDENT_AMBULATORY_CARE_PROVIDER_SITE_OTHER): Payer: BLUE CROSS/BLUE SHIELD

## 2016-11-27 DIAGNOSIS — Z1211 Encounter for screening for malignant neoplasm of colon: Secondary | ICD-10-CM

## 2016-11-27 LAB — FECAL OCCULT BLOOD, IMMUNOCHEMICAL: Fecal Occult Bld: NEGATIVE

## 2016-12-03 ENCOUNTER — Encounter: Payer: Self-pay | Admitting: Allergy and Immunology

## 2016-12-03 ENCOUNTER — Ambulatory Visit (INDEPENDENT_AMBULATORY_CARE_PROVIDER_SITE_OTHER): Payer: Self-pay | Admitting: Allergy and Immunology

## 2016-12-03 VITALS — BP 128/80 | HR 72 | Temp 98.3°F | Resp 14 | Ht 60.2 in | Wt 113.8 lb

## 2016-12-03 DIAGNOSIS — J3089 Other allergic rhinitis: Secondary | ICD-10-CM

## 2016-12-03 DIAGNOSIS — J4530 Mild persistent asthma, uncomplicated: Secondary | ICD-10-CM | POA: Diagnosis not present

## 2016-12-03 MED ORDER — MONTELUKAST SODIUM 10 MG PO TABS: 10.0000 mg | ORAL_TABLET | Freq: Every day | ORAL | 5 refills | Status: DC

## 2016-12-03 MED ORDER — CARBINOXAMINE MALEATE 6 MG PO TABS: 6.0000 mg | ORAL_TABLET | Freq: Four times a day (QID) | ORAL | 5 refills | Status: DC

## 2016-12-03 MED ORDER — BUDESONIDE 0.5 MG/2ML IN SUSP
0.5000 mg | Freq: Every day | RESPIRATORY_TRACT | 5 refills | Status: DC
Start: 2016-12-03 — End: 2017-06-09

## 2016-12-03 NOTE — Patient Instructions (Addendum)
Perennial and seasonal allergic rhinitis  Aeroallergen avoidance measures have been discussed and provided in written form.  A prescription has been provided for RyVent (carbinoxamine maleate) 6mg  every 6-8 hours as needed.  Start budesonide/saline nasal irrigation twice a day.  A prescription has been provided for budesonide 0.5 mg respules and instructions for mixing and adminstering the rinse have been discussed and provided in written form.  If allergen avoidance measures and medications fail to adequately relieve symptoms, aeroallergen immunotherapy will be considered.  Mild persistent asthma Currently with suboptimal control.  A prescription has been provided for montelukast 10 mg daily at bedtime.  Continue albuterol every 4-6 hours if needed.  Subjective and objective measures of pulmonary function will be followed and the treatment plan will be adjusted accordingly.   Return in about 3 months (around 03/05/2017), or if symptoms worsen or fail to improve.  Budesonide (Pulmicort) + Saline Irrigation/Rinse  Budesonide (Pulmicort) is an anti-inflammatory steroid medication used to decrease nasal and sinus inflammation. It is dispensed in liquid form in a vial. Although it is manufactured for use with a nebulizer, we intend for you to use it with the NeilMed Sinus Rinse bottle (preferred) or a Neti pot.   Instructions:  1) Make 240cc of saline in the NeilMed bottle using the salt packets or your own saline recipe (see separate handout).  2) Add the entire 2cc vial of liquid Budesonide (Pulmicort) to the rinse bottle and mix together.  3) While in the shower or over the sink, tilt your head forward to a comfortable level. Put the tip of the sinus rinse bottle in your nostril and aim it towards the crown or top of your head. Gently squeeze the bottle to flush out your nose. The fluid will circulate in and out of your sinus cavities, coming back out from either nostril or through your  mouth. Try not to swallow large quantities and spit it out instead.  4) Perform Budesonide (Pulmicort) + Saline irrigations 2 times daily.   Control of Mold Allergen  Mold and fungi can grow on a variety of surfaces provided certain temperature and moisture conditions exist.  Outdoor molds grow on plants, decaying vegetation and soil.  The major outdoor mold, Alternaria and Cladosporium, are found in very high numbers during hot and dry conditions.  Generally, a late Summer - Fall peak is seen for common outdoor fungal spores.  Rain will temporarily lower outdoor mold spore count, but counts rise rapidly when the rainy period ends.  The most important indoor molds are Aspergillus and Penicillium.  Dark, humid and poorly ventilated basements are ideal sites for mold growth.  The next most common sites of mold growth are the bathroom and the kitchen.  Outdoor [employer] 1. Use air conditioning and keep windows closed 2. Avoid exposure to decaying vegetation. 3. Avoid leaf raking. 4. Avoid grain handling. 5. Consider wearing a face mask if working in moldy areas.  Indoor Mold Control 1. Maintain humidity below 50%. 2. Clean washable surfaces with 5% bleach solution. 3. Remove sources e.g. Contaminated carpets.  Control of House Dust Mite Allergen  House dust mites play a major role in allergic asthma and rhinitis.  They occur in environments with high humidity wherever human skin, the food for dust mites is found. High levels have been detected in dust obtained from mattresses, pillows, carpets, upholstered furniture, bed covers, clothes and soft toys.  The principal allergen of the house dust mite is found in its feces.  A gram of dust may contain 1,000 mites and 250,000 fecal particles.  Mite antigen is easily measured in the air during house cleaning activities.    1. Encase mattresses, including the box spring, and pillow, in an air tight cover.  Seal the zipper end of the encased  mattresses with wide adhesive tape. 2. Wash the bedding in water of 130 degrees Farenheit weekly.  Avoid cotton comforters/quilts and flannel bedding: the most ideal bed covering is the dacron comforter. 3. Remove all upholstered furniture from the bedroom. 4. Remove carpets, carpet padding, rugs, and non-washable window drapes from the bedroom.  Wash drapes weekly or use plastic window coverings. 5. Remove all non-washable stuffed toys from the bedroom.  Wash stuffed toys weekly. 6. Have the room cleaned frequently with a vacuum cleaner and a damp dust-mop.  The patient should not be in a room which is being cleaned and should wait 1 hour after cleaning before going into the room. 7. Close and seal all heating outlets in the bedroom.  Otherwise, the room will become filled with dust-laden air.  An electric heater can be used to heat the room. 8. Reduce indoor humidity to less than 50%.  Do not use a humidifier.  Reducing Pollen Exposure  The American Academy of Allergy, Asthma and Immunology suggests the following steps to reduce your exposure to pollen during allergy seasons.    1. Do not hang sheets or clothing out to dry; pollen may collect on these items. 2. Do not mow lawns or spend time around freshly cut grass; mowing stirs up pollen. 3. Keep windows closed at night.  Keep car windows closed while driving. 4. Minimize morning activities outdoors, a time when pollen counts are usually at their highest. 5. Stay indoors as much as possible when pollen counts or humidity is high and on windy days when pollen tends to remain in the air longer. 6. Use air conditioning when possible.  Many air conditioners have filters that trap the pollen spores. 7. Use a HEPA room air filter to remove pollen form the indoor air you breathe.

## 2016-12-03 NOTE — Assessment & Plan Note (Signed)
   Aeroallergen avoidance measures have been discussed and provided in written form.  A prescription has been provided for RyVent (carbinoxamine maleate) 6mg every 6-8 hours as needed.  Start budesonide/saline nasal irrigation twice a day.  A prescription has been provided for budesonide 0.5 mg respules and instructions for mixing and adminstering the rinse have been discussed and provided in written form.  If allergen avoidance measures and medications fail to adequately relieve symptoms, aeroallergen immunotherapy will be considered. 

## 2016-12-03 NOTE — Assessment & Plan Note (Signed)
Currently with suboptimal control.  A prescription has been provided for montelukast 10 mg daily at bedtime.  Continue albuterol every 4-6 hours if needed.  Subjective and objective measures of pulmonary function will be followed and the treatment plan will be adjusted accordingly.

## 2016-12-03 NOTE — Progress Notes (Signed)
New Patient Note  RE: METTIE ROYLANCE MRN: [id number] DOB: [birth year] Date of Office Visit: 12/03/2016  Referring provider: Dianne Dun, MD Primary care provider: Dianne Dun, MD  Chief Complaint: Allergic Rhinitis ; Sinus Problem; and Asthma   History of present illness: Tricia Munoz is a 56 y.o. female seen today in consultation requested by Ruthe Mannan, MD.  She experiences nasal congestion, rhinorrhea, sinus pressure, postnasal drainage, ear pressure, and ocular pruritus.  These symptoms occur year around but are predominantly triggered by exposure to mold and dust.  She has noticed that her ocular pruritus increases with pollen exposure.  She was previously skin tested to aeroallergens and found to be non-reactive, however she does not believe that the physician who interpreted the test waited enough time for the results to develop.  She is here for reassessment of her allergic status. She was diagnosed with asthma several years ago.  She reports that she currently experiences asthma symptoms requiring albuterol rescue 2 or 3 times per week on average.  She denies nocturnal awakenings due to lower respiratory symptoms.  She has albuterol HFA for rescue purposes but is not on a controller medication currently.   Assessment and plan: Perennial and seasonal allergic rhinitis  Aeroallergen avoidance measures have been discussed and provided in written form.  A prescription has been provided for RyVent (carbinoxamine maleate) 6mg  every 6-8 hours as needed.  Start budesonide/saline nasal irrigation twice a day.  A prescription has been provided for budesonide 0.5 mg respules and instructions for mixing and adminstering the rinse have been discussed and provided in written form.  If allergen avoidance measures and medications fail to adequately relieve symptoms, aeroallergen immunotherapy will be considered.  Mild persistent asthma Currently with suboptimal control.  A  prescription has been provided for montelukast 10 mg daily at bedtime.  Continue albuterol every 4-6 hours if needed.  Subjective and objective measures of pulmonary function will be followed and the treatment plan will be adjusted accordingly.   Meds ordered this encounter  Medications  . Carbinoxamine Maleate (RYVENT) 6 MG TABS    Sig: Take 6 mg by mouth every 6 (six) hours.    Dispense:  120 tablet    Refill:  5  . budesonide (PULMICORT) 0.5 MG/2ML nebulizer solution    Sig: Take 2 mLs (0.5 mg total) by nebulization daily.    Dispense:  60 mL    Refill:  5  . montelukast (SINGULAIR) 10 MG tablet    Sig: Take 1 tablet (10 mg total) by mouth at bedtime.    Dispense:  30 tablet    Refill:  5    Diagnostics: Spirometry:  Normal with an FEV1 of 105% predicted. This study was performed while the patient was asymptomatic.  Please see scanned spirometry results for details. Environmental skin testing: Positive to ragweed pollen, molds, and dust mite antigen.   Physical examination: Blood pressure 128/80, pulse 72, temperature 98.3 F (36.8 C), temperature source Oral, resp. rate 14, height 5' 0.2" (1.529 m), weight 113 lb 12.8 oz (51.6 kg), last menstrual period 11/06/2012, SpO2 97 %.  General: Alert, interactive, in no acute distress. HEENT: TMs pearly gray, turbinates moderately edematous without discharge, post-pharynx mildly erythematous. Neck: Supple without lymphadenopathy. Lungs: Clear to auscultation without wheezing, rhonchi or rales. CV: Normal S1, S2 without murmurs. Abdomen: Nondistended, nontender. Skin: Warm and dry, without lesions or rashes. Extremities:  No clubbing, cyanosis or edema. Neuro:   Grossly intact.  Review of  systems:  Review of systems negative except as noted in HPI / PMHx or noted below: Review of Systems  Constitutional: Negative.   HENT: Negative.   Eyes: Negative.   Respiratory: Negative.   Cardiovascular: Negative.   Gastrointestinal:  Negative.   Genitourinary: Negative.   Musculoskeletal: Negative.   Skin: Negative.   Neurological: Negative.   Endo/Heme/Allergies: Negative.   Psychiatric/Behavioral: Negative.     Past medical history:  Past Medical History:  Diagnosis Date  . Asthma   . Hypertension   . OA (osteoarthritis)   . Osteopenia     Past surgical history:  History reviewed. No pertinent surgical history.  Family history: Family History  Problem Relation Age of Onset  . Dementia Mother   . Hypertension Father   . Allergic rhinitis Father   . Kidney disease Sister   . Angioedema Neg Hx   . Asthma Neg Hx   . Eczema Neg Hx   . Immunodeficiency Neg Hx   . Urticaria Neg Hx     Social history: Social History   Social History  . Marital status: Married    Spouse name: N/A  . Number of children: 1  . Years of education: N/A   Occupational History  . self employeed    Social History Main Topics  . Smoking status: Never Smoker  . Smokeless tobacco: Never Used  . Alcohol use Yes     Comment: Occasional  . Drug use: No  . Sexual activity: Not on file   Other Topics Concern  . Not on file   Social History Narrative   Lives in [place] with husband. 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  There is a dog in the home which has access to her bedroom.  She is a nonsmoker.  There is no known mold/water damage in the home.  Allergies as of 12/03/2016   No Known Allergies     Medication List       Accurate as of 12/03/16  5:55 PM. Always use your most recent med list.          albuterol 108 (90 Base) MCG/ACT inhaler Commonly known as:  PROVENTIL HFA;VENTOLIN HFA Inhale 2 puffs into the lungs every 6 (six) hours as needed for wheezing or shortness of breath.   azelastine 0.05 % ophthalmic solution Commonly  known as:  OPTIVAR Apply 1 drop to eye 2 (two) times daily.   benazepril-hydrochlorthiazide 10-12.5 MG tablet Commonly known as:  LOTENSIN HCT TAKE 1 TABLET BY MOUTH ONCE A DAY *NEED OFFICE VISIT   BLACK COHOSH PO Take 1 capsule by mouth daily.   budesonide 0.5 MG/2ML nebulizer solution Commonly known as:  PULMICORT Take 2 mLs (0.5 mg total) by nebulization daily.   Carbinoxamine Maleate 6 MG Tabs Commonly known as:  RYVENT Take 6 mg by mouth every 6 (six) hours.   fluocinonide-emollient 0.05 % cream Commonly known as:  LIDEX-E Apply 1 application topically 2 (two) times daily.   fluticasone 50 MCG/ACT nasal spray Commonly known as:  FLONASE Place 2 sprays into both nostrils daily.   levocetirizine 5 MG tablet Commonly known as:  XYZAL TAKE ONE TABLET BY MOUTH EACH EVENING   methylPREDNISolone 4 MG Tbpk tablet Commonly known as:  MEDROL DOSEPAK Use as directed.   montelukast 10 MG  tablet Commonly known as:  SINGULAIR Take 1 tablet (10 mg total) by mouth at bedtime.   mupirocin cream 2 % Commonly known as:  BACTROBAN Apply 1 application topically 2 (two) times daily.   simvastatin 10 MG tablet Commonly known as:  ZOCOR TAKE ONE TABLET BY MOUTH EVERY NIGHT AT BEDTIME   triamcinolone cream 0.1 % Commonly known as:  KENALOG Apply 1 application topically 2 (two) times daily.   venlafaxine XR 150 MG 24 hr capsule Commonly known as:  EFFEXOR-XR TAKE ONE CAPSULE BY MOUTH DAILY       Known medication allergies: No Known Allergies  I appreciate the opportunity to take part in Carolyn's care. Please do not hesitate to contact me with questions.  Sincerely,   R. Jorene Guest, MD

## 2016-12-04 ENCOUNTER — Ambulatory Visit (INDEPENDENT_AMBULATORY_CARE_PROVIDER_SITE_OTHER): Payer: BLUE CROSS/BLUE SHIELD | Admitting: Clinical

## 2016-12-04 DIAGNOSIS — F4323 Adjustment disorder with mixed anxiety and depressed mood: Secondary | ICD-10-CM | POA: Diagnosis not present

## 2016-12-12 ENCOUNTER — Ambulatory Visit (INDEPENDENT_AMBULATORY_CARE_PROVIDER_SITE_OTHER): Payer: BLUE CROSS/BLUE SHIELD | Admitting: Clinical

## 2016-12-12 DIAGNOSIS — F4323 Adjustment disorder with mixed anxiety and depressed mood: Secondary | ICD-10-CM | POA: Diagnosis not present

## 2016-12-18 ENCOUNTER — Ambulatory Visit (INDEPENDENT_AMBULATORY_CARE_PROVIDER_SITE_OTHER): Payer: BLUE CROSS/BLUE SHIELD | Admitting: Clinical

## 2016-12-18 DIAGNOSIS — F4323 Adjustment disorder with mixed anxiety and depressed mood: Secondary | ICD-10-CM

## 2016-12-24 ENCOUNTER — Ambulatory Visit: Payer: BLUE CROSS/BLUE SHIELD | Admitting: Clinical

## 2016-12-25 ENCOUNTER — Ambulatory Visit (INDEPENDENT_AMBULATORY_CARE_PROVIDER_SITE_OTHER): Payer: BLUE CROSS/BLUE SHIELD | Admitting: Clinical

## 2016-12-25 DIAGNOSIS — F4323 Adjustment disorder with mixed anxiety and depressed mood: Secondary | ICD-10-CM

## 2016-12-30 ENCOUNTER — Encounter: Payer: Self-pay | Admitting: Family Medicine

## 2016-12-30 ENCOUNTER — Ambulatory Visit (INDEPENDENT_AMBULATORY_CARE_PROVIDER_SITE_OTHER): Payer: BLUE CROSS/BLUE SHIELD | Admitting: Family Medicine

## 2016-12-30 VITALS — BP 120/80 | HR 81 | Temp 98.3°F | Wt 112.5 lb

## 2016-12-30 DIAGNOSIS — L299 Pruritus, unspecified: Secondary | ICD-10-CM | POA: Diagnosis not present

## 2016-12-30 DIAGNOSIS — R21 Rash and other nonspecific skin eruption: Secondary | ICD-10-CM

## 2016-12-30 MED ORDER — DEXAMETHASONE SODIUM PHOSPHATE 10 MG/ML IJ SOLN
10.0000 mg | Freq: Once | INTRAMUSCULAR | Status: AC
  Administered 2016-12-30: 10 mg via INTRAMUSCULAR

## 2016-12-30 NOTE — Assessment & Plan Note (Signed)
New- etiology unclear and does seem allergic. She would like to try stopping evekeo first to see if that is what she may be allergic to. IM decadron given in office to help with acute itching. Okay to continue oral antihistamine as needed. Call or return to clinic prn if these symptoms worsen or fail to improve as anticipated. The patient indicates understanding of these issues and agrees with the plan.

## 2016-12-30 NOTE — Progress Notes (Signed)
Subjective:   Patient ID: Tricia Munoz, female    DOB: 04/17/1960, 56 y.o.   MRN: [id number]  Tricia Munoz is a pleasant 56 y.o. year old female who presents to clinic today with itching on trunk  on 12/30/2016  HPI:  Very itchy rash on her abdomen and back.  Only new rxs are Evekeo and Jordan.  Has not changed lotions, detergents, or other rxs.  Zyrtec helps a little with the itching but the rash is still present. No wheezing or difficulty breathing.   Current Outpatient Prescriptions on File Prior to Visit  Medication Sig Dispense Refill  . albuterol (PROVENTIL HFA;VENTOLIN HFA) 108 (90 Base) MCG/ACT inhaler Inhale 2 puffs into the lungs every 6 (six) hours as needed for wheezing or shortness of breath. 1 Inhaler 0  . azelastine (OPTIVAR) 0.05 % ophthalmic solution Apply 1 drop to eye 2 (two) times daily. 6 mL 12  . benazepril-hydrochlorthiazide (LOTENSIN HCT) 10-12.5 MG tablet TAKE 1 TABLET BY MOUTH ONCE A DAY *NEED OFFICE VISIT 90 tablet 2  . BLACK COHOSH PO Take 1 capsule by mouth daily.    . Carbinoxamine Maleate (RYVENT) 6 MG TABS Take 6 mg by mouth every 6 (six) hours. 120 tablet 5  . fluticasone (FLONASE) 50 MCG/ACT nasal spray Place 2 sprays into both nostrils daily.    . montelukast (SINGULAIR) 10 MG tablet Take 1 tablet (10 mg total) by mouth at bedtime. 30 tablet 5  . simvastatin (ZOCOR) 10 MG tablet TAKE ONE TABLET BY MOUTH EVERY NIGHT AT BEDTIME 30 tablet 11  . venlafaxine XR (EFFEXOR-XR) 150 MG 24 hr capsule TAKE ONE CAPSULE BY MOUTH DAILY 90 capsule 3  . budesonide (PULMICORT) 0.5 MG/2ML nebulizer solution Take 2 mLs (0.5 mg total) by nebulization daily. (Patient not taking: Reported on 12/30/2016) 60 mL 5  . levocetirizine (XYZAL) 5 MG tablet TAKE ONE TABLET BY MOUTH EACH EVENING (Patient not taking: Reported on 12/30/2016) 30 tablet 11  . mupirocin cream (BACTROBAN) 2 % Apply 1 application topically 2 (two) times daily. (Patient not taking: Reported on  12/03/2016) 15 g 0  . triamcinolone cream (KENALOG) 0.1 % Apply 1 application topically 2 (two) times daily. (Patient not taking: Reported on 12/03/2016) 30 g 0   No current facility-administered medications on file prior to visit.     No Known Allergies  Past Medical History:  Diagnosis Date  . Asthma   . Hypertension   . OA (osteoarthritis)   . Osteopenia     No past surgical history on file.  Family History  Problem Relation Age of Onset  . Dementia Mother   . Hypertension Father   . Allergic rhinitis Father   . Kidney disease Sister   . Angioedema Neg Hx   . Asthma Neg Hx   . Eczema Neg Hx   . Immunodeficiency Neg Hx   . Urticaria Neg Hx     Social History   Social History  . Marital status: Married    Spouse name: N/A  . Number of children: 1  . Years of education: N/A   Occupational History  . self employeed    Social History Main Topics  . Smoking status: Never Smoker  . Smokeless tobacco: Never Used  . Alcohol use No  . Drug use: No  . Sexual activity: Not on file   Other Topics Concern  . Not on file   Social History Narrative   Lives in [place] with husband. 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   Review of Systems  Constitutional: Negative.   Respiratory: Negative.   Cardiovascular: Negative.   Gastrointestinal: Negative.   Genitourinary: Negative.   Musculoskeletal: Negative.   Skin: Positive for rash.  Neurological: Negative.   All other systems reviewed and are negative.      Objective:    BP 120/80   Pulse 81   Temp 98.3 F (36.8 C) (Oral)   Wt 112 lb 8 oz (51 kg)   LMP 11/06/2012   BMI 21.83 kg/m    Physical Exam  Constitutional: She is oriented to person, place, and time. She appears well-developed and well-nourished.  No distress.  HENT:  Head: Normocephalic and atraumatic.  Eyes: Conjunctivae are normal.  Cardiovascular: Normal rate.   Pulmonary/Chest: Effort normal.  Musculoskeletal: Normal range of motion.  Neurological: She is alert and oriented to person, place, and time.  Skin: Rash noted. Rash is maculopapular. She is not diaphoretic.  Psychiatric: She has a normal mood and affect. Her behavior is normal. Judgment and thought content normal.  Nursing note and vitals reviewed.         Assessment & Plan:   Itching - Plan: dexamethasone (DECADRON) injection 10 mg No Follow-up on file.

## 2017-01-01 ENCOUNTER — Encounter: Payer: Self-pay | Admitting: Family Medicine

## 2017-01-01 ENCOUNTER — Ambulatory Visit (INDEPENDENT_AMBULATORY_CARE_PROVIDER_SITE_OTHER): Payer: BLUE CROSS/BLUE SHIELD | Admitting: Family Medicine

## 2017-01-01 VITALS — BP 102/70 | HR 82 | Temp 97.9°F | Ht 60.2 in | Wt 113.8 lb

## 2017-01-01 DIAGNOSIS — L299 Pruritus, unspecified: Secondary | ICD-10-CM

## 2017-01-01 DIAGNOSIS — R21 Rash and other nonspecific skin eruption: Secondary | ICD-10-CM | POA: Diagnosis not present

## 2017-01-01 MED ORDER — DEXAMETHASONE SODIUM PHOSPHATE 10 MG/ML IJ SOLN
10.0000 mg | Freq: Once | INTRAMUSCULAR | Status: AC
  Administered 2017-01-01: 10 mg via INTRAMUSCULAR

## 2017-01-01 NOTE — Assessment & Plan Note (Signed)
She has stopped singulair. IM decadron given.  If no improvement in two days, refer to derm. The patient indicates understanding of these issues and agrees with the plan.

## 2017-01-01 NOTE — Progress Notes (Signed)
Subjective:   Patient ID: Tricia Munoz, female    DOB: 1960/10/25, 56 y.o.   MRN: [id number]  Tricia Munoz is a pleasant 56 y.o. year old female who presents to clinic today with Acute Visit (Pt is here today for possible allergic reaction believes to medication unsure which one, She has a itchy rash on trunk area but no where else ) and Allergic Reaction  on 01/01/2017  HPI:  Saw her two days for itchy rash (12/30/16)- note reviewed. ? Possible drug reaction to Evekeo.  She has stopped this and I gave her IM decadron.  Neither have helped.    She then remember that she also recently started Singulair so she stopped this yesterday.  Here today for another IM decadron injection to see if this would help.  Has recently seen an allergist but not for this complaint.  Current Outpatient Prescriptions on File Prior to Visit  Medication Sig Dispense Refill  . albuterol (PROVENTIL HFA;VENTOLIN HFA) 108 (90 Base) MCG/ACT inhaler Inhale 2 puffs into the lungs every 6 (six) hours as needed for wheezing or shortness of breath. 1 Inhaler 0  . Amphetamine Sulfate (EVEKEO PO) Take by mouth. Take 1-2 by mouth daily    . azelastine (OPTIVAR) 0.05 % ophthalmic solution Apply 1 drop to eye 2 (two) times daily. 6 mL 12  . benazepril-hydrochlorthiazide (LOTENSIN HCT) 10-12.5 MG tablet TAKE 1 TABLET BY MOUTH ONCE A DAY *NEED OFFICE VISIT 90 tablet 2  . BLACK COHOSH PO Take 1 capsule by mouth daily.    . budesonide (PULMICORT) 0.5 MG/2ML nebulizer solution Take 2 mLs (0.5 mg total) by nebulization daily. 60 mL 5  . fluticasone (FLONASE) 50 MCG/ACT nasal spray Place 2 sprays into both nostrils daily.    Marland Kitchen levocetirizine (XYZAL) 5 MG tablet TAKE ONE TABLET BY MOUTH EACH EVENING 30 tablet 11  . lurasidone (LATUDA) 40 MG TABS tablet Take 40 mg by mouth 2 (two) times daily.    . Lutetium Lu 177 Dotatate (LUTATHERA IV) Inject into the vein.    . simvastatin (ZOCOR) 10 MG tablet TAKE ONE TABLET BY MOUTH  EVERY NIGHT AT BEDTIME 30 tablet 11  . venlafaxine XR (EFFEXOR-XR) 150 MG 24 hr capsule TAKE ONE CAPSULE BY MOUTH DAILY 90 capsule 3   No current facility-administered medications on file prior to visit.     No Known Allergies  Past Medical History:  Diagnosis Date  . Asthma   . Hypertension   . OA (osteoarthritis)   . Osteopenia     No past surgical history on file.  Family History  Problem Relation Age of Onset  . Dementia Mother   . Hypertension Father   . Allergic rhinitis Father   . Kidney disease Sister   . Angioedema Neg Hx   . Asthma Neg Hx   . Eczema Neg Hx   . Immunodeficiency Neg Hx   . Urticaria Neg Hx     Social History   Social History  . Marital status: Married    Spouse name: N/A  . Number of children: 1  . Years of education: N/A   Occupational History  . self employeed    Social History Main Topics  . Smoking status: Never Smoker  . Smokeless tobacco: Never Used  . Alcohol use No  . Drug use: No  . Sexual activity: Not on file   Other Topics Concern  . Not on file   Social History Narrative   Lives in  [place] with husband. 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   Review of Systems  Constitutional: Negative.   Skin: Positive for rash.  All other systems reviewed and are negative.      Objective:    BP 102/70 (BP Location: Left Arm, Patient Position: Sitting, Cuff Size: Normal)   Pulse 82   Temp 97.9 F (36.6 C) (Oral)   Ht 5' 0.2" (1.529 m)   Wt 113 lb 12.8 oz (51.6 kg)   LMP 11/06/2012   SpO2 98%   BMI 22.08 kg/m    Physical Exam  Constitutional: She is oriented to person, place, and time. She appears well-developed and well-nourished. No distress.  HENT:  Head: Normocephalic and atraumatic.  Eyes: Conjunctivae are  normal.  Cardiovascular: Normal rate.   Pulmonary/Chest: Effort normal.  Musculoskeletal: Normal range of motion. She exhibits no edema.  Neurological: She is alert and oriented to person, place, and time. No cranial nerve deficit.  Skin: Rash noted. Rash is maculopapular. She is not diaphoretic.  Psychiatric: She has a normal mood and affect. Her behavior is normal. Judgment and thought content normal.  Nursing note and vitals reviewed.         Assessment & Plan:   Itching - Plan: dexamethasone (DECADRON) injection 10 mg No Follow-up on file.

## 2017-01-02 ENCOUNTER — Ambulatory Visit: Payer: BLUE CROSS/BLUE SHIELD | Admitting: Clinical

## 2017-01-07 ENCOUNTER — Ambulatory Visit (INDEPENDENT_AMBULATORY_CARE_PROVIDER_SITE_OTHER): Payer: BLUE CROSS/BLUE SHIELD | Admitting: Clinical

## 2017-01-07 DIAGNOSIS — F4323 Adjustment disorder with mixed anxiety and depressed mood: Secondary | ICD-10-CM | POA: Diagnosis not present

## 2017-01-10 ENCOUNTER — Encounter: Payer: Self-pay | Admitting: Allergy

## 2017-01-10 ENCOUNTER — Ambulatory Visit (INDEPENDENT_AMBULATORY_CARE_PROVIDER_SITE_OTHER): Payer: BLUE CROSS/BLUE SHIELD | Admitting: Allergy

## 2017-01-10 VITALS — BP 118/78 | HR 87 | Resp 17

## 2017-01-10 DIAGNOSIS — L298 Other pruritus: Secondary | ICD-10-CM

## 2017-01-10 DIAGNOSIS — L2089 Other atopic dermatitis: Secondary | ICD-10-CM

## 2017-01-10 MED ORDER — LEVOCETIRIZINE DIHYDROCHLORIDE 5 MG PO TABS: ORAL_TABLET | ORAL | 11 refills | Status: DC

## 2017-01-10 MED ORDER — TRIAMCINOLONE ACETONIDE 0.1 % EX CREA: TOPICAL_CREAM | CUTANEOUS | 2 refills | Status: DC

## 2017-01-10 NOTE — Patient Instructions (Addendum)
Dermatitis, inflammatory     - appearance of rash does not appear to be a drug eruption however does look more consistent with multiple bite reaction.       - recommend use of Kenalog to affected areas thin layer 2 twice a day until improved     - would take Xyzal twice a day (AM and PM) for itch control     - reserve hydroxyzine  for breakthough symptoms.  Can use hydroxyzine up to 3-4 times a day if needed     - continue routine moisturization to keep skin well moisturized.   Eczema of the wrist    - continue use of kenalog as needed for flares of wrist    - keep area moisturized with a thick, greasier moisturizer like Eucerin, Aquafor, CeraVe, Vaseline   Follow-up 4-6 months or sooner if needed

## 2017-01-10 NOTE — Progress Notes (Signed)
Follow-up Note  RE: Tricia Munoz MRN: [id number] DOB: Nov 13, 1960 Date of Office Visit: 01/10/2017   History of present illness: Tricia Munoz is a 56 y.o. female presenting today for rash.  She was last seen in the office on 12/03/16 by Dr. Nunzio Cobbs for initial evaluation of allergic rhinitis and asthma.  She reports taking xzyal daily and budesonide irrigation twice a day.  She was taking singulair but stopped due to complaint rash (below).  She denies albuterol use since last visit.    She presents today as she developed a rash on her trunk.  4 weeks ago she was gardening and thought she got into chiggers as she developed a truncal rash.  She states she sprayed bug spray on her exposed areas (legs and arms) but not on her trunk.  The rash has been very itchy and she does scratch and has broken skin due to scratching.  She saw her PCP twice in past month who treated her with IM decadon both visits. She was also prescribed hydroxyzine.  She does feel the rash has improved especially as hydroxyzine has helped with the itch.  She is taking hydroxyzine  4 tabs a day (on top of her daily xyzal).  She still has the rash and would like to rash to resolve.  She denies any fevers, new foods, recent illness, change in medications (except starting singulair about a month ago), no new soap/lotions/detergents.  She did change her soap to see if would help but didn't.  She also thought maybe the singulair was causing rash thus she stopped this 1.5 weeks ago.  She also thought maybe her adhd medication (evekeo) was causing rash so she stopped this 2 weeks ago.  She hasn't noted significant improvement off these medications.  She is planning to restart the evekeo.   She denies any household members with similar rash and does not note any bugs in the home.   She also reports having a relapsing itchy, dry patch on both wrist.  She uses kenalog when rash is flared which has helped.  She does wear watch and  jewelry ans states has done this for years but the rash on wrist is relatively new over past year.   She has not tried use of the kenalog on her truncal rash.      Review of systems: Review of Systems  Constitutional: Negative for chills, fever and malaise/fatigue.  HENT: Negative for congestion, ear discharge, ear pain, nosebleeds, sinus pain and sore throat.   Eyes: Negative for pain, discharge and redness.  Respiratory: Negative for cough, shortness of breath and wheezing.   Cardiovascular: Negative for chest pain.  Gastrointestinal: Negative for abdominal pain, diarrhea, heartburn, nausea and vomiting.  Musculoskeletal: Negative for joint pain.  Skin: Positive for itching and rash.  Neurological: Negative for headaches.    All other systems negative unless noted above in HPI  Past medical/social/surgical/family history have been reviewed and are unchanged unless specifically indicated below.  No changes  Medication List: Allergies as of 01/10/2017   No Known Allergies     Medication List       Accurate as of 01/10/17 12:39 PM. Always use your most recent med list.          albuterol 108 (90 Base) MCG/ACT inhaler Commonly known as:  PROVENTIL HFA;VENTOLIN HFA Inhale 2 puffs into the lungs every 6 (six) hours as needed for wheezing or shortness of breath.   azelastine 0.05 % ophthalmic solution  Commonly known as:  OPTIVAR Apply 1 drop to eye 2 (two) times daily.   benazepril-hydrochlorthiazide 10-12.5 MG tablet Commonly known as:  LOTENSIN HCT TAKE 1 TABLET BY MOUTH ONCE A DAY *NEED OFFICE VISIT   BLACK COHOSH PO Take 1 capsule by mouth daily.   budesonide 0.5 MG/2ML nebulizer solution Commonly known as:  PULMICORT Take 2 mLs (0.5 mg total) by nebulization daily.   EVEKEO PO Take by mouth. Take 1-2 by mouth daily   EVEKEO 10 MG Tabs Generic drug:  Amphetamine Sulfate   fluticasone 50 MCG/ACT nasal spray Commonly known as:  FLONASE Place 2 sprays into both  nostrils daily.   hydrOXYzine 25 MG capsule Commonly known as:  VISTARIL   hydrOXYzine 25 MG tablet Commonly known as:  ATARAX/VISTARIL Take 25 mg by mouth 3 (three) times daily as needed.   LATUDA 40 MG Tabs tablet Generic drug:  lurasidone Take 40 mg by mouth 2 (two) times daily.   levocetirizine 5 MG tablet Commonly known as:  XYZAL TAKE ONE TABLET BY MOUTH EACH EVENING   LUTATHERA IV Inject into the vein.   simvastatin 10 MG tablet Commonly known as:  ZOCOR TAKE ONE TABLET BY MOUTH EVERY NIGHT AT BEDTIME   triamcinolone cream 0.1 % Commonly known as:  KENALOG   venlafaxine XR 150 MG 24 hr capsule Commonly known as:  EFFEXOR-XR TAKE ONE CAPSULE BY MOUTH DAILY       Known medication allergies: No Known Allergies   Physical examination: Blood pressure 118/78, pulse 87, resp. rate 17, last menstrual period 11/06/2012, SpO2 98 %.  General: Alert, interactive, in no acute distress. HEENT: PERRLA, TMs pearly gray, turbinates minimally edematous without discharge, post-pharynx non erythematous. Neck: Supple without lymphadenopathy. Lungs: Clear to auscultation without wheezing, rhonchi or rales. {no increased work of breathing. CV: Normal S1, S2 without murmurs. Abdomen: Nondistended, nontender. Skin: scattered erythematous papules across abdomen, flank and back only.  there is some papules excoriated. . B/l ventral surface of wrist with dry, erythematous patch Extremities:  No clubbing, cyanosis or edema. Neuro:   Grossly intact.  Diagnositics/Labs: None today  Assessment and plan:   Dermatitis, inflammatory     - appearance of rash does not appear to be a drug eruption however does look more consistent with multiple bite reaction.       - recommend use of Kenalog to affected areas thin layer twice a day until improved     - would take Xyzal twice a day (AM and PM) for itch control     - reserve hydroxyzine  for breakthough symptoms.  Can use hydroxyzine  up to 3-4 times a day if needed     - continue routine moisturization to keep skin well moisturized.    Eczema of the wrist    - continue use of kenalog as needed for flares of wrist    - keep area moisturized with a thick, greasier moisturizer like Eucerin, Aquafor, CeraVe, Vaseline   Follow-up 4-6 months or sooner if needed  I appreciate the opportunity to take part in Rowen's care. Please do not hesitate to contact me with questions.  Sincerely,   Margo Aye, MD Allergy/Immunology [facility]

## 2017-01-15 ENCOUNTER — Ambulatory Visit (INDEPENDENT_AMBULATORY_CARE_PROVIDER_SITE_OTHER): Payer: BLUE CROSS/BLUE SHIELD | Admitting: Clinical

## 2017-01-15 ENCOUNTER — Encounter: Payer: Self-pay | Admitting: Family Medicine

## 2017-01-15 ENCOUNTER — Ambulatory Visit (INDEPENDENT_AMBULATORY_CARE_PROVIDER_SITE_OTHER): Payer: BLUE CROSS/BLUE SHIELD | Admitting: Family Medicine

## 2017-01-15 VITALS — BP 118/84 | HR 86 | Temp 98.2°F | Resp 16 | Wt 114.8 lb

## 2017-01-15 DIAGNOSIS — F4323 Adjustment disorder with mixed anxiety and depressed mood: Secondary | ICD-10-CM

## 2017-01-15 DIAGNOSIS — F900 Attention-deficit hyperactivity disorder, predominantly inattentive type: Secondary | ICD-10-CM

## 2017-01-15 DIAGNOSIS — Z23 Encounter for immunization: Secondary | ICD-10-CM | POA: Diagnosis not present

## 2017-01-15 DIAGNOSIS — R21 Rash and other nonspecific skin eruption: Secondary | ICD-10-CM | POA: Diagnosis not present

## 2017-01-15 MED ORDER — AMPHETAMINE-DEXTROAMPHET ER 30 MG PO CP24: 30.0000 mg | ORAL_CAPSULE | ORAL | 0 refills | Status: DC

## 2017-01-15 NOTE — Assessment & Plan Note (Signed)
?   allery to evekeo. Added to allergy list. Restart adderall- rx printed and given to pt.

## 2017-01-15 NOTE — Progress Notes (Signed)
Subjective:   Patient ID: Tricia Munoz, female    DOB: Aug 21, 1960, 56 y.o.   MRN: [id number]  Tricia Munoz is a pleasant 56 y.o. year old female who presents to clinic today with Follow-up  on 01/15/2017  HPI:  Rash-  I initially saw her for this complaint on 9/17 and then again on 01/01/17.  Notes reviewed.   Presumed drug allergy- so stopped taking Evekeo initially without improvement and then d/c'd singulair.  Also received IM decadron x 2.  Then saw Dr. Delorse Lek (allergist) on 9/05/23/16- note reviewed- felt this was not a drug eruption but possible a multiple bite reaction.  Advised kenalog twice daily and xyzal twice daily (hydroxizine prn for break through itching).  Tricia Munoz still thinks it was a reaction to either the Evekeo or singulair as the rash has almost completely resolved.   Current Outpatient Prescriptions on File Prior to Visit  Medication Sig Dispense Refill  . albuterol (PROVENTIL HFA;VENTOLIN HFA) 108 (90 Base) MCG/ACT inhaler Inhale 2 puffs into the lungs every 6 (six) hours as needed for wheezing or shortness of breath. 1 Inhaler 0  . Amphetamine Sulfate (EVEKEO PO) Take by mouth. Take 1-2 by mouth daily    . azelastine (OPTIVAR) 0.05 % ophthalmic solution Apply 1 drop to eye 2 (two) times daily. 6 mL 12  . benazepril-hydrochlorthiazide (LOTENSIN HCT) 10-12.5 MG tablet TAKE 1 TABLET BY MOUTH ONCE A DAY *NEED OFFICE VISIT 90 tablet 2  . BLACK COHOSH PO Take 1 capsule by mouth daily.    . budesonide (PULMICORT) 0.5 MG/2ML nebulizer solution Take 2 mLs (0.5 mg total) by nebulization daily. 60 mL 5  . EVEKEO 10 MG TABS   0  . fluticasone (FLONASE) 50 MCG/ACT nasal spray Place 2 sprays into both nostrils daily.    . hydrOXYzine (ATARAX/VISTARIL) 25 MG tablet Take 25 mg by mouth 3 (three) times daily as needed.    . hydrOXYzine (VISTARIL) 25 MG capsule   0  . levocetirizine (XYZAL) 5 MG tablet TAKE ONE TABLET BY MOUTH EACH EVENING 30 tablet 11  . lurasidone  (LATUDA) 40 MG TABS tablet Take 40 mg by mouth 2 (two) times daily.    . Lutetium Lu 177 Dotatate (LUTATHERA IV) Inject into the vein.    . simvastatin (ZOCOR) 10 MG tablet TAKE ONE TABLET BY MOUTH EVERY NIGHT AT BEDTIME 30 tablet 11  . triamcinolone cream (KENALOG) 0.1 % Apply a thin layer to affected areas twice a day until improved. 453.6 g 2  . venlafaxine XR (EFFEXOR-XR) 150 MG 24 hr capsule TAKE ONE CAPSULE BY MOUTH DAILY 90 capsule 3   No current facility-administered medications on file prior to visit.     Allergies  Allergen Reactions  . Singulair [Montelukast Sodium] Rash    Past Medical History:  Diagnosis Date  . Asthma   . Hypertension   . OA (osteoarthritis)   . Osteopenia     No past surgical history on file.  Family History  Problem Relation Age of Onset  . Dementia Mother   . Hypertension Father   . Allergic rhinitis Father   . Kidney disease Sister   . Angioedema Neg Hx   . Asthma Neg Hx   . Eczema Neg Hx   . Immunodeficiency Neg Hx   . Urticaria Neg Hx     Social History   Social History  . Marital status: Married    Spouse name: N/A  . Number of children: 1  .  Years of education: N/A   Occupational History  . self employeed    Social History Main Topics  . Smoking status: Never Smoker  . Smokeless tobacco: Never Used  . Alcohol use No  . Drug use: No  . Sexual activity: Not on file   Other Topics Concern  . Not on file   Social History Narrative   Lives in [place] with husband. 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   Review of Systems  Constitutional: Negative.   Respiratory: Negative.   Cardiovascular: Negative.   Skin: Positive for rash.  All other systems reviewed and are negative.      Objective:    BP 118/84 (BP  Location: Left Arm, Patient Position: Sitting, Cuff Size: Normal)   Pulse 86   Temp 98.2 F (36.8 C) (Oral)   Resp 16   Wt 114 lb 12 oz (52.1 kg)   LMP 11/06/2012   SpO2 97%   BMI 22.26 kg/m    Physical Exam   General:  Well-developed,well-nourished,in no acute distress; alert,appropriate and cooperative throughout examination Head:  normocephalic and atraumatic.   Eyes:  vision grossly intact, PERRL Ears:  R ear normal and L ear normal externally, TMs clear bilaterally Nose:  no external deformity.   Mouth:  good dentition.   Neck:  No deformities, masses, or tenderness noted.  Lungs:  Normal respiratory effort, chest expands symmetrically. Lungs are clear to auscultation, no crackles or wheezes. Heart:  Normal rate and regular rhythm. S1 and S2 normal without gallop, murmur, click, rub or other extra sounds. Msk:  No deformity or scoliosis noted of thoracic or lumbar spine.   Extremities:  No clubbing, cyanosis, edema, or deformity noted with normal full range of motion of all joints.   Neurologic:  alert & oriented X3 and gait normal.   Skin:  Intact without suspicious lesions or rashes Psych:  Cognition and judgment appear intact. Alert and cooperative with normal attention span and concentration. No apparent delusions, illusions, hallucinations      Assessment & Plan:   Rash and nonspecific skin eruption  Attention deficit hyperactivity disorder (ADHD), predominantly inattentive type No Follow-up on file.

## 2017-01-15 NOTE — Assessment & Plan Note (Signed)
Improved. ? Drug eruption. Follow up as needed.

## 2017-01-21 ENCOUNTER — Ambulatory Visit (INDEPENDENT_AMBULATORY_CARE_PROVIDER_SITE_OTHER): Payer: BLUE CROSS/BLUE SHIELD | Admitting: Clinical

## 2017-01-21 DIAGNOSIS — F4323 Adjustment disorder with mixed anxiety and depressed mood: Secondary | ICD-10-CM | POA: Diagnosis not present

## 2017-01-28 ENCOUNTER — Ambulatory Visit (INDEPENDENT_AMBULATORY_CARE_PROVIDER_SITE_OTHER): Payer: BLUE CROSS/BLUE SHIELD | Admitting: Clinical

## 2017-01-28 DIAGNOSIS — F4323 Adjustment disorder with mixed anxiety and depressed mood: Secondary | ICD-10-CM

## 2017-02-11 ENCOUNTER — Ambulatory Visit (INDEPENDENT_AMBULATORY_CARE_PROVIDER_SITE_OTHER): Payer: BLUE CROSS/BLUE SHIELD | Admitting: Clinical

## 2017-02-11 DIAGNOSIS — F4323 Adjustment disorder with mixed anxiety and depressed mood: Secondary | ICD-10-CM

## 2017-02-13 ENCOUNTER — Other Ambulatory Visit: Payer: Self-pay | Admitting: Family Medicine

## 2017-02-13 MED ORDER — AMPHETAMINE-DEXTROAMPHET ER 30 MG PO CP24: 30.0000 mg | ORAL_CAPSULE | ORAL | 0 refills | Status: DC

## 2017-02-13 NOTE — Telephone Encounter (Signed)
Ok to print out and place on my desk for signature. 

## 2017-02-13 NOTE — Telephone Encounter (Signed)
Printed and placed on TA's desk for signature/thx dmf

## 2017-02-17 NOTE — Telephone Encounter (Signed)
LMOVM that Rx is ready at the front desk/thx dmf

## 2017-02-27 ENCOUNTER — Ambulatory Visit: Payer: BLUE CROSS/BLUE SHIELD | Admitting: Clinical

## 2017-03-04 ENCOUNTER — Ambulatory Visit: Payer: BLUE CROSS/BLUE SHIELD | Admitting: Allergy and Immunology

## 2017-03-11 ENCOUNTER — Ambulatory Visit: Payer: BLUE CROSS/BLUE SHIELD | Admitting: Clinical

## 2017-03-19 ENCOUNTER — Ambulatory Visit: Payer: BLUE CROSS/BLUE SHIELD | Admitting: Clinical

## 2017-03-19 ENCOUNTER — Telehealth: Payer: Self-pay | Admitting: Family Medicine

## 2017-03-19 NOTE — Telephone Encounter (Signed)
Copied from CRM [phone]#[id number]. Topic: Quick Communication - See Telephone Encounter >> Mar 19, 2017 12:43 PM Everardo PacificMoton, N[place]uko Kelsay, [place] wrote: CRM for notification. See Telephone encounter for: Patient needs a refill on her Addreall. If someone could give her a call back about this matter  03/19/17.

## 2017-03-20 NOTE — Telephone Encounter (Signed)
LMOVM for pt to RTC to advise if she will continue care with Dr. Dayton MartesAron or if [facility] will be [facility] is deciding to stay [facility]/I will need to know if she is willing to come to the [place] location to pick-up her prescription as well/thx dmf

## 2017-03-20 NOTE — Telephone Encounter (Signed)
Adderall refill.Last OV 01/15/17. Last refilled on 02/13/17.

## 2017-03-21 ENCOUNTER — Telehealth: Payer: Self-pay

## 2017-03-21 MED ORDER — AMPHETAMINE-DEXTROAMPHET ER 30 MG PO CP24: 30.0000 mg | ORAL_CAPSULE | ORAL | 0 refills | Status: DC

## 2017-03-21 NOTE — Telephone Encounter (Signed)
Pt called back and stated that she will be following TA here and will sched next F/U here/printed Adderall for Dec, Jan, Feb and will call pt when signed/thx dmf

## 2017-03-21 NOTE — Telephone Encounter (Signed)
Pt would like a call back as soon as you can to let her know when she can pick up her scripts.  Pt states she is out of this med and really needs!

## 2017-03-21 NOTE — Telephone Encounter (Signed)
CN-I have shredded the December Adderall Rx and reprinted in your name/thx dmf

## 2017-03-21 NOTE — Telephone Encounter (Signed)
I spoke with pt and made her aware that CN signed the December Rx in TA's absence and she said that she would P/U by 5pm today/she is also aware I will call her when the next 2 Rx's are signed that are printed at TA's desk/she will sched an appt for Feb/thx dmf

## 2017-03-21 NOTE — Telephone Encounter (Signed)
I discussed need for Adderall refill with Dr. Dayton MartesAron via telephone. Controlled Substance Database reviewed today. No red flag. [facility]

## 2017-03-26 NOTE — Telephone Encounter (Signed)
LMOVM that Rx's for Jan & Feb are ready at the front/thx dmf

## 2017-04-04 ENCOUNTER — Ambulatory Visit: Payer: BLUE CROSS/BLUE SHIELD | Admitting: Clinical

## 2017-06-01 ENCOUNTER — Telehealth (HOSPITAL_COMMUNITY): Payer: Self-pay | Admitting: Emergency Medicine

## 2017-06-01 ENCOUNTER — Other Ambulatory Visit: Payer: Self-pay

## 2017-06-01 ENCOUNTER — Encounter (HOSPITAL_COMMUNITY): Payer: Self-pay | Admitting: Emergency Medicine

## 2017-06-01 ENCOUNTER — Ambulatory Visit (INDEPENDENT_AMBULATORY_CARE_PROVIDER_SITE_OTHER): Payer: BLUE CROSS/BLUE SHIELD

## 2017-06-01 ENCOUNTER — Ambulatory Visit (HOSPITAL_COMMUNITY)
Admission: EM | Admit: 2017-06-01 | Discharge: 2017-06-01 | Disposition: A | Discharge status: Home / Self Care | Payer: BLUE CROSS/BLUE SHIELD | Attending: Internal Medicine | Admitting: Internal Medicine

## 2017-06-01 DIAGNOSIS — M25531 Pain in right wrist: Secondary | ICD-10-CM

## 2017-06-01 DIAGNOSIS — M79641 Pain in right hand: Secondary | ICD-10-CM | POA: Diagnosis not present

## 2017-06-01 DIAGNOSIS — S6991XA Unspecified injury of right wrist, hand and finger(s), initial encounter: Secondary | ICD-10-CM | POA: Diagnosis not present

## 2017-06-01 MED ORDER — DICLOFENAC SODIUM 1 % TD GEL: 2.0000 g | Freq: Four times a day (QID) | TRANSDERMAL | 0 refills | Status: DC

## 2017-06-01 MED ORDER — MELOXICAM 7.5 MG PO TABS: 7.5000 mg | ORAL_TABLET | Freq: Every day | ORAL | 0 refills | Status: DC

## 2017-06-01 NOTE — ED Triage Notes (Signed)
Pt states yseterday she slipped and fell and used her R hand to brace her fall, c/o R wrist swelling and pain.

## 2017-06-01 NOTE — Discharge Instructions (Signed)
X-ray is negative for fracture or dislocation, though shows  mild arthritis.  Start Mobic as directed, you can take this with food and over-the-counter antacid such as omeprazole to prevent stomach upset.  Can use Voltaren gel for additional pain relief.  Ice compress, wrist splint during activity.  If symptoms not improving in 10-15 days, follow-up with orthopedics for further evaluation and treatment needed.

## 2017-06-01 NOTE — ED Provider Notes (Signed)
[facility]    CSN: [id number] Arrival date & time: 06/01/17  1224     History   Chief Complaint Chief Complaint  Patient presents with  . Fall  . Wrist Pain    HPI Tricia Munoz is a 57 y.o. female.   57 year old female comes in for right wrist pain after fall yesterday.  States she slipped and fell forward with her arm out stretched.  Complaining of diffuse wrist pain with painful movement.  Some swelling to the dorsal aspect of the hand.  Denies numbness, tingling.  Has not taken anything for it.      Past Medical History:  Diagnosis Date  . Asthma   . Hypertension   . OA (osteoarthritis)   . Osteopenia     Patient Active Problem List   Diagnosis Date Noted  . Mild persistent asthma 12/03/2016  . Rash and nonspecific skin eruption 06/19/2016  . Perennial and seasonal allergic rhinitis 10/04/2015  . Anemia 07/18/2015  . Adjustment disorder with mixed anxiety and depressed mood 03/21/2015  . ADD (attention deficit disorder) 03/21/2015  . HLD (hyperlipidemia) 07/05/2014  . Depression 12/09/2013  . HTN (hypertension) 03/18/2012  . Osteoarthritis 02/17/2009  . POLYARTHRITIS 02/17/2009  . OSTEOPENIA 02/17/2009    History reviewed. No pertinent surgical history.  OB History    No data available       Home Medications    Prior to Admission medications   Medication Sig Start Date End Date Taking? Authorizing Provider  albuterol (PROVENTIL HFA;VENTOLIN HFA) 108 (90 Base) MCG/ACT inhaler Inhale 2 puffs into the lungs every 6 (six) hours as needed for wheezing or shortness of breath. 08/22/16   Lorre MunroeBaity, Regina W, NP  amphetamine-dextroamphetamine (ADDERALL XR) 30 MG 24 hr capsule Take 1 capsule (30 mg total) by mouth every morning. 03/21/17   Nche, Bonna Gainsharlotte Lum, NP  azelastine (OPTIVAR) 0.05 % ophthalmic solution Apply 1 drop to eye 2 (two) times daily. 07/05/14   Dianne DunAron, Talia M, MD  benazepril-hydrochlorthiazide (LOTENSIN HCT) 10-12.5 MG tablet TAKE 1  TABLET BY MOUTH ONCE A DAY *NEED OFFICE VISIT 11/18/16   Dianne DunAron, Talia M, MD  BLACK COHOSH PO Take 1 capsule by mouth daily.    [provider]  budesonide (PULMICORT) 0.5 MG/2ML nebulizer solution Take 2 mLs (0.5 mg total) by nebulization daily. 12/03/16   Bobbitt, Heywood Ilesalph Carter, MD  diclofenac sodium (VOLTAREN) 1 % GEL Apply 2 g topically 4 (four) times daily. 06/01/17   Cathie HoopsYu, Amy V, PA-C  EVEKEO 10 MG TABS  01/02/17   [provider]  fluticasone (FLONASE) 50 MCG/ACT nasal spray Place 2 sprays into both nostrils daily.    [provider]  hydrOXYzine (ATARAX/VISTARIL) 25 MG tablet Take 25 mg by mouth 3 (three) times daily as needed.    [provider]  hydrOXYzine (VISTARIL) 25 MG capsule  12/31/16   [provider]  levocetirizine (XYZAL) 5 MG tablet TAKE ONE TABLET BY MOUTH EACH EVENING 01/10/17   Marcelyn BruinsPadgett, Shaylar Patricia, MD  lurasidone (LATUDA) 40 MG TABS tablet Take 40 mg by mouth 2 (two) times daily.    [provider]  Lutetium Lu 177 Dotatate (LUTATHERA IV) Inject into the vein.    [provider]  meloxicam (MOBIC) 7.5 MG tablet Take 1 tablet (7.5 mg total) by mouth daily. 06/01/17   Cathie HoopsYu, Amy V, PA-C  simvastatin (ZOCOR) 10 MG tablet TAKE ONE TABLET BY MOUTH EVERY NIGHT AT BEDTIME 10/09/16   Dianne DunAron, Talia M, MD  triamcinolone cream (KENALOG) 0.1 % Apply a thin layer to affected areas twice a day until improved. 01/10/17   Marcelyn Bruins, MD  venlafaxine XR (EFFEXOR-XR) 150 MG 24 hr capsule TAKE ONE CAPSULE BY MOUTH DAILY 10/02/16   Dianne Dun, MD    Family History Family History  Problem Relation Age of Onset  . Dementia Mother   . Hypertension Father   . Allergic rhinitis Father   . Kidney disease Sister   . Angioedema Neg Hx   . Asthma Neg Hx   . Eczema Neg Hx   . Immunodeficiency Neg Hx   . Urticaria Neg Hx     Social History Social History   Tobacco Use  . Smoking status: Never Smoker  . Smokeless tobacco:  Never Used  Substance Use Topics  . Alcohol use: No  . Drug use: No     Allergies   Evekeo [amphetamine sulfate] and Singulair [montelukast sodium]   Review of Systems Review of Systems  Reason unable to perform ROS: See HPI as above.     Physical Exam Triage Vital Signs ED Triage Vitals [06/01/17 1338]  Enc Vitals Group     BP (!) 153/99     Pulse Rate 84     Resp 16     Temp 98.2 F (36.8 C)     Temp src      SpO2 99 %     Weight      Height      Head Circumference      Peak Flow      Pain Score 8     Pain Loc      Pain Edu?      Excl. in GC?    No data found.  Updated Vital Signs BP (!) 153/99   Pulse 84   Temp 98.2 F (36.8 C)   Resp 16   LMP 11/06/2012   SpO2 99%   Physical Exam  Constitutional: She is oriented to person, place, and time. She appears well-developed and well-nourished. No distress.  HENT:  Head: Normocephalic and atraumatic.  Eyes: Conjunctivae are normal. Pupils are equal, round, and reactive to light.  Musculoskeletal:  Swelling to the dorsal aspect of the wrist without erythema, increased warmth.  Tenderness to palpation of dorsal, ventral, radial aspect of the wrist.  No tenderness on palpation of ulnar aspect of the wrist.  Tenderness to palpation of proximal MCPs.  No tenderness on palpation of phalanges.  Full range of motion of fingers.  Decreased range of motion of wrist, patient stating due to pain.  Sensation intact and equal bilaterally.  Grip strength normal.  Radial pulse 2+ and equal bilaterally.  Cap refill less than 2 seconds.  Neurological: She is alert and oriented to person, place, and time.     UC Treatments / Results  Labs (all labs ordered are listed, but only abnormal results are displayed) Labs Reviewed - No data to display  EKG  EKG Interpretation None       Radiology Dg Hand Complete Right  Result Date: 06/01/2017 CLINICAL DATA:  Fall yesterday.  Hand pain EXAM: RIGHT HAND - COMPLETE 3+ VIEW  COMPARISON:  None. FINDINGS: If negative for fracture Mild degenerative change base of thumb. No erosion or bone lesion. Mild degenerative change in the DIP joints. IMPRESSION: Negative for fracture Electronically Signed   By: Marlan Palau M.D.   On: 06/01/2017 14:05    Procedures Procedures (including critical care time)  Medications  Ordered in UC Medications - No data to display   Initial Impression / Assessment and Plan / UC Course  I have reviewed the triage vital signs and the nursing notes.  Pertinent labs & imaging results that were available during my care of the patient were reviewed by me and considered in my medical decision making (see chart for details).    X-ray  negative for fracture or dislocation.  Start Mobic, ice compress, elevation.  Wrist splint applied.  Patient can apply Voltaren gel for additional pain relief as needed.  Follow-up with orthopedics for further evaluation if symptoms not improving.  Return precautions given.  Patient expresses understanding and agrees to plan.  Final Clinical Impressions(s) / UC Diagnoses   Final diagnoses:  Right wrist pain    ED Discharge Orders        Ordered    meloxicam (MOBIC) 7.5 MG tablet  Daily,   Status:  Discontinued     06/01/17 1421    diclofenac sodium (VOLTAREN) 1 % GEL  4 times daily,   Status:  Discontinued     06/01/17 1421        Belinda Fisher, PA-C 06/01/17 1441

## 2017-06-09 ENCOUNTER — Encounter: Payer: Self-pay | Admitting: Family Medicine

## 2017-06-09 ENCOUNTER — Ambulatory Visit: Payer: BLUE CROSS/BLUE SHIELD | Admitting: Family Medicine

## 2017-06-09 VITALS — BP 116/74 | HR 85 | Temp 98.8°F | Ht 61.0 in | Wt 117.6 lb

## 2017-06-09 DIAGNOSIS — F419 Anxiety disorder, unspecified: Secondary | ICD-10-CM | POA: Diagnosis not present

## 2017-06-09 DIAGNOSIS — F411 Generalized anxiety disorder: Secondary | ICD-10-CM | POA: Insufficient documentation

## 2017-06-09 DIAGNOSIS — F900 Attention-deficit hyperactivity disorder, predominantly inattentive type: Secondary | ICD-10-CM | POA: Diagnosis not present

## 2017-06-09 MED ORDER — AZELASTINE HCL 0.05 % OP SOLN: 1.0000 [drp] | Freq: Two times a day (BID) | OPHTHALMIC | 12 refills | Status: DC

## 2017-06-09 MED ORDER — AMPHETAMINE-DEXTROAMPHET ER 30 MG PO CP24: 30.0000 mg | ORAL_CAPSULE | ORAL | 0 refills | Status: DC

## 2017-06-09 MED ORDER — LURASIDONE HCL 80 MG PO TABS: 80.0000 mg | ORAL_TABLET | Freq: Every day | ORAL | 3 refills | Status: DC

## 2017-06-09 NOTE — Progress Notes (Signed)
Subjective:   Patient ID: Tricia Munoz, female    DOB: 28-Mar-1961, 57 y.o.   MRN: [id number]  Tricia Munoz is a pleasant 57 y.o. year old female who presents to clinic today with ADD (Patient is here today to F/U with ADD.  Currently takes Adderall XR 30mg  & would like to discuss.  She states that she does a lot of teeth clenching and holds anxiety and is wondering if this is the culprit.  ) and Anxiety (Patient states that she used to take the  Jordan and it worked very well for her but her ins would not cover it.  She would like to try this again to see if it could be covered.)  on 06/09/2017  HPI:  ADD- currently taking Adderall XR 30 mg daily. She wonders if she is on the right dose.  Clenches her teeth often.  Feels she holds a lot of tension and wonders if it is coming from the Adderall.  Feels Latuda worked well for her anxiety  But insurance wouldn't cover it at the time.  She would like to try this again.  Current Outpatient Medications on File Prior to Visit  Medication Sig Dispense Refill  . albuterol (PROVENTIL HFA;VENTOLIN HFA) 108 (90 Base) MCG/ACT inhaler Inhale 2 puffs into the lungs every 6 (six) hours as needed for wheezing or shortness of breath. 1 Inhaler 0  . amphetamine-dextroamphetamine (ADDERALL XR) 30 MG 24 hr capsule Take 1 capsule (30 mg total) by mouth every morning. 30 capsule 0  . azelastine (OPTIVAR) 0.05 % ophthalmic solution Apply 1 drop to eye 2 (two) times daily. 6 mL 12  . benazepril-hydrochlorthiazide (LOTENSIN HCT) 10-12.5 MG tablet TAKE 1 TABLET BY MOUTH ONCE A DAY *NEED OFFICE VISIT 90 tablet 2  . fluticasone (FLONASE) 50 MCG/ACT nasal spray Place 2 sprays into both nostrils daily.    Marland Kitchen levocetirizine (XYZAL) 5 MG tablet TAKE ONE TABLET BY MOUTH EACH EVENING 30 tablet 11  . simvastatin (ZOCOR) 10 MG tablet TAKE ONE TABLET BY MOUTH EVERY NIGHT AT BEDTIME 30 tablet 11  . triamcinolone cream (KENALOG) 0.1 % Apply a thin layer to affected areas twice  a day until improved. 453.6 g 2  . venlafaxine XR (EFFEXOR-XR) 150 MG 24 hr capsule TAKE ONE CAPSULE BY MOUTH DAILY 90 capsule 3  . lurasidone (LATUDA) 40 MG TABS tablet Take 40 mg by mouth 2 (two) times daily.     No current facility-administered medications on file prior to visit.     Allergies  Allergen Reactions  . Evekeo [Amphetamine Sulfate]   . Singulair [Montelukast Sodium] Rash    Past Medical History:  Diagnosis Date  . Asthma   . Hypertension   . OA (osteoarthritis)   . Osteopenia     No past surgical history on file.  Family History  Problem Relation Age of Onset  . Dementia Mother   . Hypertension Father   . Allergic rhinitis Father   . Kidney disease Sister   . Angioedema Neg Hx   . Asthma Neg Hx   . Eczema Neg Hx   . Immunodeficiency Neg Hx   . Urticaria Neg Hx     Social History   Socioeconomic History  . Marital status: Married    Spouse name: Not on file  . Number of children: 1  . Years of education: Not on file  . Highest education level: Not on file  Social Needs  . Financial resource strain: Not on  file  . Food insecurity - worry: Not on file  . Food insecurity - inability: Not on file  . Transportation needs - medical: Not on file  . Transportation needs - non-medical: Not on file  Occupational History  . Occupation: self employeed  Tobacco Use  . Smoking status: Never Smoker  . Smokeless tobacco: Never Used  Substance and Sexual Activity  . Alcohol use: No  . Drug use: No  . Sexual activity: Not on file  Other Topics Concern  . Not on file  Social History Narrative   Lives in [place] with husband. 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      Walks regularly   The PMH, PSH, Social History, Family History, Medications, and allergies [facility], and have been updated if relevant.   Review of Systems  Psychiatric/Behavioral: Negative for agitation, behavioral problems,  confusion, decreased concentration, dysphoric mood, hallucinations, self-injury, sleep disturbance and suicidal ideas. The patient is nervous/anxious. The patient is not hyperactive.   All other systems reviewed and are negative.      Objective:    BP 116/74 (BP Location: Left Arm, Patient Position: Sitting, Cuff Size: Normal)   Pulse 85   Temp 98.8 F (37.1 C) (Oral)   Ht 5\' 1"  (1.549 m)   Wt 117 lb 9.6 oz (53.3 kg)   LMP 11/06/2012   SpO2 97%   BMI 22.22 kg/m    Physical Exam  Constitutional: She is oriented to person, place, and time. She appears well-developed and well-nourished. No distress.  HENT:  Head: Normocephalic and atraumatic.  Eyes: Conjunctivae are normal.  Cardiovascular: Normal rate.  Pulmonary/Chest: Effort normal.  Neurological: She is alert and oriented to person, place, and time. No cranial nerve deficit.  Skin: Skin is warm and dry. She is not diaphoretic.  Psychiatric: She has a normal mood and affect. Her behavior is normal. Judgment and thought content normal.  Nursing note and vitals reviewed.         Assessment & Plan:   Attention deficit hyperactivity disorder (ADHD), predominantly inattentive type No Follow-up on file.

## 2017-06-09 NOTE — Assessment & Plan Note (Signed)
Deteriorated- being off latuda is more likely the cause than adderall. Restart latuda.

## 2017-06-09 NOTE — Assessment & Plan Note (Addendum)
Continue adderall at current dose. rx printed and given to pt. Call or return to clinic prn if these symptoms worsen or fail to improve as anticipated. The patient indicates understanding of these issues and agrees with the plan. >25 minutes spent in face to face time with patient, >50% spent in counselling or coordination of care

## 2017-06-09 NOTE — Patient Instructions (Signed)
Great to see you. Start taking Latuda 80 mg- 1/2 tab twice daily.

## 2017-07-30 DIAGNOSIS — W19XXXA Unspecified fall, initial encounter: Secondary | ICD-10-CM | POA: Diagnosis not present

## 2017-07-30 DIAGNOSIS — Z043 Encounter for examination and observation following other accident: Secondary | ICD-10-CM | POA: Diagnosis not present

## 2017-07-30 DIAGNOSIS — S20412A Abrasion of left back wall of thorax, initial encounter: Secondary | ICD-10-CM | POA: Diagnosis not present

## 2017-07-30 DIAGNOSIS — Z888 Allergy status to other drugs, medicaments and biological substances status: Secondary | ICD-10-CM | POA: Diagnosis not present

## 2017-07-30 DIAGNOSIS — R402412 Glasgow coma scale score 13-15, at arrival to emergency department: Secondary | ICD-10-CM | POA: Diagnosis not present

## 2017-07-30 DIAGNOSIS — S20411A Abrasion of right back wall of thorax, initial encounter: Secondary | ICD-10-CM | POA: Diagnosis not present

## 2017-07-30 DIAGNOSIS — W1809XA Striking against other object with subsequent fall, initial encounter: Secondary | ICD-10-CM | POA: Diagnosis not present

## 2017-07-30 DIAGNOSIS — S0003XA Contusion of scalp, initial encounter: Secondary | ICD-10-CM | POA: Diagnosis not present

## 2017-07-30 DIAGNOSIS — I10 Essential (primary) hypertension: Secondary | ICD-10-CM | POA: Diagnosis not present

## 2017-07-30 DIAGNOSIS — E785 Hyperlipidemia, unspecified: Secondary | ICD-10-CM | POA: Diagnosis not present

## 2017-07-30 DIAGNOSIS — R110 Nausea: Secondary | ICD-10-CM | POA: Diagnosis not present

## 2017-07-30 DIAGNOSIS — S0990XA Unspecified injury of head, initial encounter: Secondary | ICD-10-CM | POA: Diagnosis not present

## 2017-07-30 DIAGNOSIS — M47812 Spondylosis without myelopathy or radiculopathy, cervical region: Secondary | ICD-10-CM | POA: Diagnosis not present

## 2017-07-30 DIAGNOSIS — Z79899 Other long term (current) drug therapy: Secondary | ICD-10-CM | POA: Diagnosis not present

## 2017-08-04 ENCOUNTER — Ambulatory Visit: Payer: BLUE CROSS/BLUE SHIELD | Admitting: Family Medicine

## 2017-08-04 ENCOUNTER — Other Ambulatory Visit: Payer: Self-pay

## 2017-08-04 ENCOUNTER — Encounter: Payer: Self-pay | Admitting: Family Medicine

## 2017-08-04 DIAGNOSIS — F0781 Postconcussional syndrome: Secondary | ICD-10-CM | POA: Insufficient documentation

## 2017-08-04 DIAGNOSIS — S0990XA Unspecified injury of head, initial encounter: Secondary | ICD-10-CM | POA: Insufficient documentation

## 2017-08-04 DIAGNOSIS — S0990XD Unspecified injury of head, subsequent encounter: Secondary | ICD-10-CM

## 2017-08-04 HISTORY — DX: Unspecified injury of head, initial encounter: S09.90XA

## 2017-08-04 MED ORDER — AZELASTINE-FLUTICASONE 137-50 MCG/ACT NA SUSP: NASAL | 99 refills | Status: DC

## 2017-08-04 MED ORDER — AMPHETAMINE-DEXTROAMPHET ER 30 MG PO CP24: 30.0000 mg | ORAL_CAPSULE | ORAL | 0 refills | Status: DC

## 2017-08-04 NOTE — Patient Instructions (Signed)
Great to see you. Brain rest until symptoms resolve.

## 2017-08-04 NOTE — Telephone Encounter (Signed)
Printed Adderall for May, June, July per TA/she signed and gave to pt/she is to sched OV for this in July/thx dmf

## 2017-08-04 NOTE — Progress Notes (Signed)
Subjective:   Patient ID: Tricia Munoz, female    DOB: 04/01/1961, 57 y.o.   MRN: [id number]  Tricia Munoz is a pleasant 57 y.o. year old female who presents to clinic today with Hospitalization Follow-up (Patient is here today for an ED F/U.  On 4.17.19 pt went to [facility] after a fall.  Hit left posterior side of head and had hematoma to left posterior scalp.  CT C-Spone without contrast-no evidence of acute fracture, mild degenerative changes of the C-Spine.   CT-Heat-No intracranial hemorrhage but there was soft tissue swelling overlying the left parietal calvarium.  States that still has some slight dizzines with movement) and Back Pain (She states that she is having a significant amount of pain in her left lower back/buttock from the fall which she explains that she was walking down stone steps without a rail and turned around and slipped.  )  on 08/04/2017  HPI:  ER follow up- was seen at [facility] ED on 07/30/17 falling walking down stairs without a handrail during a hike and hitting her head on rocks. Notes reviewed.  She hit left posterior side of her head.  She is still having some dizziness with movement of her head.  No LOC.  No nausea, vomiting or changes in her vision.  No headache.  Head and C spine CT were done which showed no evidence of fracture or bleed.  CT Head  Final Result  IMPRESSION:  1. No intracranial hemorrhage.  2. Soft tissue swelling overlying the LEFT parietal calvarium.     CT Spine Cervical  Final Result  IMPRESSION:  No evidence of acute fracture.    Today, along with some dizziness with head movements, she is having pain in her left back/buttock and hip. Current Outpatient Medications on File Prior to Visit  Medication Sig Dispense Refill  . albuterol (PROVENTIL HFA;VENTOLIN HFA) 108 (90 Base) MCG/ACT inhaler Inhale 2 puffs into the lungs every 6 (six) hours as needed for wheezing or shortness of breath. 1 Inhaler 0  . azelastine (OPTIVAR)  0.05 % ophthalmic solution Apply 1 drop to eye 2 (two) times daily. 6 mL 12  . benazepril-hydrochlorthiazide (LOTENSIN HCT) 10-12.5 MG tablet TAKE 1 TABLET BY MOUTH ONCE A DAY *NEED OFFICE VISIT 90 tablet 2  . levocetirizine (XYZAL) 5 MG tablet TAKE ONE TABLET BY MOUTH EACH EVENING 30 tablet 11  . simvastatin (ZOCOR) 10 MG tablet TAKE ONE TABLET BY MOUTH EVERY NIGHT AT BEDTIME 30 tablet 11  . triamcinolone cream (KENALOG) 0.1 % Apply a thin layer to affected areas twice a day until improved. 453.6 g 2  . venlafaxine XR (EFFEXOR-XR) 150 MG 24 hr capsule TAKE ONE CAPSULE BY MOUTH DAILY 90 capsule 3   No current facility-administered medications on file prior to visit.     Allergies  Allergen Reactions  . Evekeo [Amphetamine Sulfate]   . Montelukast     itch  . Singulair [Montelukast Sodium] Rash    Past Medical History:  Diagnosis Date  . Asthma   . Hypertension   . OA (osteoarthritis)   . Osteopenia     No past surgical history on file.  Family History  Problem Relation Age of Onset  . Dementia Mother   . Hypertension Father   . Allergic rhinitis Father   . Kidney disease Sister   . Angioedema Neg Hx   . Asthma Neg Hx   . Eczema Neg Hx   . Immunodeficiency Neg Hx   .  Urticaria Neg Hx     Social History   Socioeconomic History  . Marital status: Married    Spouse name: Not on file  . Number of children: 1  . Years of education: Not on file  . Highest education level: Not on file  Occupational History  . Occupation: self employeed  Social Needs  . Financial resource strain: Not on file  . Food insecurity:    Worry: Not on file    Inability: Not on file  . Transportation needs:    Medical: Not on file    Non-medical: Not on file  Tobacco Use  . Smoking status: Never Smoker  . Smokeless tobacco: Never Used  Substance and Sexual Activity  . Alcohol use: No  . Drug use: No  . Sexual activity: Not on file  Lifestyle  . Physical activity:    Days per week:  Not on file    Minutes per session: Not on file  . Stress: Not on file  Relationships  . Social connections:    Talks on phone: Not on file    Gets together: Not on file    Attends religious service: Not on file    Active member of club or organization: Not on file    Attends meetings of clubs or organizations: Not on file    Relationship status: Not on file  . Intimate partner violence:    Fear of current or ex partner: Not on file    Emotionally abused: Not on file    Physically abused: Not on file    Forced sexual activity: Not on file  Other Topics Concern  . Not on file  Social History Narrative   [place] in [place]. 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      Walks regularly   The PMH, PSH, Social History, Family History, Medications, and allergies have been reviewed in [facility], and have been updated if relevant.   Review of Systems  Constitutional: Negative.   HENT: Negative.   Eyes: Negative.   Respiratory: Negative.   Cardiovascular: Negative.   Gastrointestinal: Negative.   Musculoskeletal: Positive for arthralgias and back pain.  Neurological: Positive for dizziness. Negative for tremors, seizures, syncope, facial asymmetry, speech difficulty, weakness, light-headedness, numbness and headaches.  Hematological: Negative.   Psychiatric/Behavioral: Negative.   All other systems reviewed and are negative.      Objective:    BP 114/60 (BP Location: Right Arm, Patient Position: Sitting, Cuff Size: Normal)   Pulse 84   Temp 98.7 F (37.1 C) (Oral)   Ht 5\' 1"  (1.549 m)   Wt 115 lb (52.2 kg)   LMP 11/06/2012   SpO2 98%   BMI 21.73 kg/m    Physical Exam  Constitutional: She is oriented to person, place, and time. She appears well-developed and well-nourished. No distress.  HENT:  Head: Normocephalic and atraumatic.  Cardiovascular: Normal rate.  Pulmonary/Chest: Effort normal.  Neurological: She is alert  and oriented to person, place, and time. No cranial nerve deficit. Coordination normal.  Skin: Skin is warm. She is not diaphoretic.     Psychiatric: She has a normal mood and affect.  Nursing note and vitals reviewed.         Assessment & Plan:   Injury of head, subsequent encounter  Post concussive syndrome No follow-ups on file.

## 2017-08-04 NOTE — Assessment & Plan Note (Signed)
No red flag symptoms on exam or history- mild concussion. Advised and discussed strict brain rest. Call or return to clinic prn if these symptoms worsen or fail to improve as anticipated. The patient indicates understanding of these issues and agrees with the plan.

## 2017-08-04 NOTE — Assessment & Plan Note (Signed)
Exam reassuring!

## 2017-08-13 ENCOUNTER — Telehealth: Payer: Self-pay | Admitting: Family Medicine

## 2017-08-13 NOTE — Telephone Encounter (Signed)
Copied from CRM [id number]. Topic: Quick Communication - See Telephone Encounter >> Aug 13, 2017  2:22 PM Windy Kalata, NT wrote: CRM for notification. See Telephone encounter for: 08/13/17.  Patient is calling and states she was seen by Dr. Dayton Martes on 08/04/17 and was giving her amphetamine-dextroamphetamine (ADDERALL XR) 30 MG 24 hr capsule prescriptions for May, June and July. She states she has lost them and does not know where they are. She is requesting 3 more prescriptions. Please call patient.

## 2017-08-14 MED ORDER — AMPHETAMINE-DEXTROAMPHET ER 30 MG PO CP24
30.0000 mg | ORAL_CAPSULE | ORAL | 0 refills | Status: DC
Start: 2017-08-14 — End: 2017-10-22

## 2017-08-14 NOTE — Telephone Encounter (Signed)
Pt aware/she will call if things don't get better/thx dmf

## 2017-08-14 NOTE — Telephone Encounter (Signed)
TA-Plz see pt message below/states that she has lost the Adderall Rx's for May, June, July given her at 4.22.19 OV/PMP shows last fill being 4.2.19 and is compliant/plz advise/thx dmf

## 2017-08-14 NOTE — Telephone Encounter (Signed)
She took a hard fall so I am not surprised that she is still sore.  I am sorry she isn't feeling well.  Is she using a heating pad at all?

## 2017-08-14 NOTE — Telephone Encounter (Signed)
TA-Pt states that still doesn't feel well/aches all over and still has knots on her head/unsure how long she should feel this way after that fall and if she needs to schedule an appointment with you? Plz advise/thx dmf      Printed Rx and TA signed/I called pt and she states that I can shred it because she found them

## 2017-08-14 NOTE — Telephone Encounter (Signed)
Okay to reprint one rx.

## 2017-08-17 ENCOUNTER — Other Ambulatory Visit: Payer: Self-pay | Admitting: Family Medicine

## 2017-08-25 ENCOUNTER — Ambulatory Visit: Payer: BLUE CROSS/BLUE SHIELD | Admitting: Family Medicine

## 2017-08-25 ENCOUNTER — Encounter: Payer: Self-pay | Admitting: Family Medicine

## 2017-08-25 VITALS — BP 112/64 | HR 73 | Temp 98.5°F | Ht 61.0 in | Wt 113.8 lb

## 2017-08-25 DIAGNOSIS — F0781 Postconcussional syndrome: Secondary | ICD-10-CM

## 2017-08-25 NOTE — Progress Notes (Signed)
Subjective:   Patient ID: Tricia Munoz, female    DOB: 27-Apr-1960, 57 y.o.   MRN: [id number]  Tricia Munoz is a pleasant 57 y.o. year old female who presents to clinic today with Follow-up (Patient is here today to F/U with concussion.  She was see on 4.22.19 for a ED F/U from a fall on 4.17.19.  Today she states that her body hurts still and she cannot turn her neck to the left without pain.  Having memory issues and she cannot hold her head back to look at the stars without getting dizzy.  If she is not the one driving then she has motion sickness.  Still has knots on her head.   )  on 08/25/2017  HPI:  Post concussive syndrome- Saw her on 08/04/17 for ED follow up from fall.  Note reviewed.  Was seen at [facility] ED on 07/30/17 falling walking down stairs without a handrail during a hike and hitting her head on rocks. Notes reviewed.  She hit left posterior side of her head.    Head and C spine CT were done which showed no evidence of fracture or bleed.  CT Head  Final Result  IMPRESSION:  1. No intracranial hemorrhage.  2. Soft tissue swelling overlying the LEFT parietal calvarium.     CT Spine Cervical  Final Result  IMPRESSION:  No evidence of acute fracture.    Advised brain rest.     She is here today because she is still sore- cannot turn her neck to the left without pain. Feels she is also having memory issues and cannot look at the stars without getting dizzy.  Although all of her symptoms are improving.  She is able to read, look at a computer and drive without dizziness. Has no nausea.  Headaches have resolved.  She is still having some dizziness with movement of her head, especially looking up at the stars but this is better as well.  Current Outpatient Medications on File Prior to Visit  Medication Sig Dispense Refill  . albuterol (PROVENTIL HFA;VENTOLIN HFA) 108 (90 Base) MCG/ACT inhaler Inhale 2 puffs into the lungs every 6 (six) hours as needed  for wheezing or shortness of breath. 1 Inhaler 0  . amphetamine-dextroamphetamine (ADDERALL XR) 30 MG 24 hr capsule Take 1 capsule (30 mg total) by mouth every morning. 30 capsule 0  . azelastine (OPTIVAR) 0.05 % ophthalmic solution Apply 1 drop to eye 2 (two) times daily. 6 mL 12  . Azelastine-Fluticasone (DYMISTA) 137-50 MCG/ACT SUSP Instil 1-2 sprays bilateral nares qd prn Place into the nose. Instil 1-2 sprays bilateral nares qd prn 23 g PRN  . benazepril-hydrochlorthiazide (LOTENSIN HCT) 10-12.5 MG tablet Take 1 tablet by mouth daily. 90 tablet 1  . LATUDA 80 MG TABS tablet Take 1 tablet by mouth daily.  0  . levocetirizine (XYZAL) 5 MG tablet TAKE ONE TABLET BY MOUTH EACH EVENING 30 tablet 11  . simvastatin (ZOCOR) 10 MG tablet TAKE ONE TABLET BY MOUTH EVERY NIGHT AT BEDTIME 30 tablet 11  . triamcinolone cream (KENALOG) 0.1 % Apply a thin layer to affected areas twice a day until improved. 453.6 g 2  . venlafaxine XR (EFFEXOR-XR) 150 MG 24 hr capsule TAKE ONE CAPSULE BY MOUTH DAILY 90 capsule 3   No current facility-administered medications on file prior to visit.     Allergies  Allergen Reactions  . Evekeo [Amphetamine Sulfate]   . Montelukast     itch  .  Singulair [Montelukast Sodium] Rash    Past Medical History:  Diagnosis Date  . Asthma   . Hypertension   . OA (osteoarthritis)   . Osteopenia     No past surgical history on file.  Family History  Problem Relation Age of Onset  . Dementia Mother   . Hypertension Father   . Allergic rhinitis Father   . Kidney disease Sister   . Angioedema Neg Hx   . Asthma Neg Hx   . Eczema Neg Hx   . Immunodeficiency Neg Hx   . Urticaria Neg Hx     Social History   Socioeconomic History  . Marital status: Married    Spouse name: Not on file  . Number of children: 1  . Years of education: Not on file  . Highest education level: Not on file  Occupational History  . Occupation: self employeed  Social Needs  . Financial  resource strain: Not on file  . Food insecurity:    Worry: Not on file    Inability: Not on file  . Transportation needs:    Medical: Not on file    Non-medical: Not on file  Tobacco Use  . Smoking status: Never Smoker  . Smokeless tobacco: Never Used  Substance and Sexual Activity  . Alcohol use: No  . Drug use: No  . Sexual activity: Not on file  Lifestyle  . Physical activity:    Days per week: Not on file    Minutes per session: Not on file  . Stress: Not on file  Relationships  . Social connections:    Talks on phone: Not on file    Gets together: Not on file    Attends religious service: Not on file    Active member of club or organization: Not on file    Attends meetings of clubs or organizations: Not on file    Relationship status: Not on file  . Intimate partner violence:    Fear of current or ex partner: Not on file    Emotionally abused: Not on file    Physically abused: Not on file    Forced sexual activity: Not on file  Other Topics Concern  . Not on file  Social History Narrative   Lives in [place] with husband. 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  Review of Systems  Constitutional: Negative for fever.  HENT: Negative.   Eyes: Negative.   Respiratory: Negative.   Cardiovascular: Negative.   Gastrointestinal: Negative.   Endocrine: Negative.   Genitourinary: Negative.   Musculoskeletal: Positive for arthralgias, back pain and neck stiffness. Negative for gait problem, joint swelling, myalgias and neck pain.  Skin: Negative.   Allergic/Immunologic: Negative.   Neurological: Positive for dizziness. Negative for tremors, seizures, syncope, facial asymmetry, speech difficulty, weakness, light-headedness, numbness and headaches.  Hematological:  Negative.   Psychiatric/Behavioral: Negative.   All other systems reviewed and are negative.      Objective:    BP 112/64 (BP Location: Left Arm, Patient Position: Sitting, Cuff Size: Normal)   Pulse 73   Temp 98.5 F (36.9 C) (Oral)   Ht  (1.549 m)   Wt 113 lb 12.8 oz (51.6 kg)   LMP 11/06/2012   SpO2 98%   BMI  21.50 kg/m    Physical Exam  Constitutional: She is oriented to person, place, and time. She appears well-developed and well-nourished. No distress.  HENT:  Head: Normocephalic and atraumatic.  Neck: No spinous process tenderness and no muscular tenderness present. No neck rigidity. Decreased range of motion present. No edema and no erythema present.  Cardiovascular: Normal rate.  Pulmonary/Chest: Effort normal.  Musculoskeletal:       Cervical back: Normal.       Thoracic back: Normal.       Lumbar back: She exhibits pain and spasm. She exhibits normal range of motion, no tenderness, no bony tenderness, no swelling, no edema, no deformity, no laceration and normal pulse.  Neurological: She is alert and oriented to person, place, and time. No cranial nerve deficit. Coordination normal.  Skin: Skin is warm and dry. She is not diaphoretic.  Psychiatric: She has a normal mood and affect. Her behavior is normal. Judgment and thought content normal.  Nursing note and vitals reviewed.         Assessment & Plan:   Post concussive syndrome No follow-ups on file.

## 2017-08-25 NOTE — Patient Instructions (Signed)
Great to see you. Please update me in 2 1/2 weeks.  It was great to see you. Happy Mother's Day!

## 2017-08-25 NOTE — Assessment & Plan Note (Signed)
Exam reassuring and symptoms are gradually improving. Advised continued supportive care, brain rest when possible. Advised to call office in 2 weeks with an update.  If she is still symptomatic, refer to neuro.  The patient indicates understanding of these issues and agrees with the plan.

## 2017-09-07 ENCOUNTER — Other Ambulatory Visit: Payer: Self-pay | Admitting: Family Medicine

## 2017-09-22 ENCOUNTER — Other Ambulatory Visit: Payer: Self-pay | Admitting: Family Medicine

## 2017-10-02 ENCOUNTER — Other Ambulatory Visit: Payer: Self-pay | Admitting: Family Medicine

## 2017-10-02 NOTE — Telephone Encounter (Signed)
rx sent in 30 days supply. Pt needs an appt for more refill, last lipid was 09/2016

## 2017-10-08 ENCOUNTER — Encounter: Payer: Self-pay | Admitting: Family Medicine

## 2017-10-08 ENCOUNTER — Ambulatory Visit: Payer: BLUE CROSS/BLUE SHIELD | Admitting: Family Medicine

## 2017-10-08 VITALS — BP 118/64 | HR 88 | Temp 98.6°F | Ht 61.0 in | Wt 115.8 lb

## 2017-10-08 DIAGNOSIS — R413 Other amnesia: Secondary | ICD-10-CM

## 2017-10-08 DIAGNOSIS — F0781 Postconcussional syndrome: Secondary | ICD-10-CM | POA: Diagnosis not present

## 2017-10-08 HISTORY — DX: Other amnesia: R41.3

## 2017-10-08 NOTE — Progress Notes (Signed)
Subjective:   Patient ID: Tricia Munoz, female    DOB: 1960/09/07, 57 y.o.   MRN: [id number]  Tricia Munoz is a pleasant 57 y.o. year old female who presents to clinic today with Memory Issues (Patient is here today to discuss memory issues. She feels that this started even before she fell and had concussion.  Her mother that died from dementia and 2 paternal aunts that have dementia. She feels that her short-term memory is no where near where it should be.  But others have noticed.  MMSE completed pt scored 30 perfect.  Pt agrees to [employer].  Also plz see PHQ-9 & GAE-7.)  on 10/08/2017  HPI:  Patient is here today to discuss memory issues. She feels that this started even before she fell and had concussion. Her mother that died from dementia and 2 paternal aunts that have dementia. She feels that her short-term memory is no where near where it should be. But others have noticed.   Mainly short term memory issues- telling someone something she told them yesterday or earlier in the day.  Post concussive syndrome- Saw her on 08/04/17 for ED follow up from fall.  Note reviewed.  Was seen at [facility] ED on 4/17/19falling walking down stairs without a handrailduring a hike and hitting her head on rocks. Notes reviewed.  She hit left posterior side of her head.   Head and C spine CT were done which showed no evidence of fracture or bleed.  CT Head  Final Result  IMPRESSION:  1. No intracranial hemorrhage.  2. Soft tissue swelling overlying the LEFT parietal calvarium.     CT Spine Cervical  Final Result  IMPRESSION:  No evidence of acute fracture.   Advised brain rest.     Current Outpatient Medications on File Prior to Visit  Medication Sig Dispense Refill  . albuterol (PROVENTIL HFA;VENTOLIN HFA) 108 (90 Base) MCG/ACT inhaler Inhale 2 puffs into the lungs every 6 (six) hours as needed for wheezing or shortness of breath. 1 Inhaler 0  .  amphetamine-dextroamphetamine (ADDERALL XR) 30 MG 24 hr capsule Take 1 capsule (30 mg total) by mouth every morning. 30 capsule 0  . azelastine (OPTIVAR) 0.05 % ophthalmic solution Apply 1 drop to eye 2 (two) times daily. 6 mL 12  . Azelastine-Fluticasone (DYMISTA) 137-50 MCG/ACT SUSP Instil 1-2 sprays bilateral nares qd prn Place into the nose. Instil 1-2 sprays bilateral nares qd prn 23 g PRN  . benazepril-hydrochlorthiazide (LOTENSIN HCT) 10-12.5 MG tablet Take 1 tablet by mouth daily. 90 tablet 1  . LATUDA 80 MG TABS tablet Take 1 tablet by mouth daily.  0  . levocetirizine (XYZAL) 5 MG tablet TAKE ONE TABLET BY MOUTH EACH EVENING 30 tablet 11  . simvastatin (ZOCOR) 10 MG tablet TAKE 1 TABLET BY MOUTH EVERYDAY AT BEDTIME 30 tablet 0  . triamcinolone cream (KENALOG) 0.1 % Apply a thin layer to affected areas twice a day until improved. 453.6 g 2  . venlafaxine XR (EFFEXOR-XR) 150 MG 24 hr capsule TAKE ONE CAPSULE BY MOUTH DAILY 90 capsule 1   No current facility-administered medications on file prior to visit.     Allergies  Allergen Reactions  . Evekeo [Amphetamine Sulfate]   . Montelukast     itch  . Singulair [Montelukast Sodium] Rash    Past Medical History:  Diagnosis Date  . Asthma   . Hypertension   . OA (osteoarthritis)   . Osteopenia  No past surgical history on file.  Family History  Problem Relation Age of Onset  . Dementia Mother   . Hypertension Father   . Allergic rhinitis Father   . Kidney disease Sister   . Angioedema Neg Hx   . Asthma Neg Hx   . Eczema Neg Hx   . Immunodeficiency Neg Hx   . Urticaria Neg Hx     Social History   Socioeconomic History  . Marital status: Married    Spouse name: Not on file  . Number of children: 1  . Years of education: Not on file  . Highest education level: Not on file  Occupational History  . Occupation: self employeed  Social Needs  . Financial resource strain: Not on file  . Food insecurity:    Worry:  Not on file    Inability: Not on file  . Transportation needs:    Medical: Not on file    Non-medical: Not on file  Tobacco Use  . Smoking status: Never Smoker  . Smokeless tobacco: Never Used  Substance and Sexual Activity  . Alcohol use: No  . Drug use: No  . Sexual activity: Not on file  Lifestyle  . Physical activity:    Days per week: Not on file    Minutes per session: Not on file  . Stress: Not on file  Relationships  . Social connections:    Talks on phone: Not on file    Gets together: Not on file    Attends religious service: Not on file    Active member of club or organization: Not on file    Attends meetings of clubs or organizations: Not on file    Relationship status: Not on file  . Intimate partner violence:    Fear of current or ex partner: Not on file    Emotionally abused: Not on file    Physically abused: Not on file    Forced sexual activity: Not on file  Other Topics Concern  . Not on file  Social History Narrative   Lives in [place] with husband. 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  Review of Systems  Neurological: Negative for dizziness, tremors, seizures, syncope, facial asymmetry, speech difficulty, weakness, light-headedness, numbness and headaches.  Psychiatric/Behavioral: Negative for dysphoric mood and hallucinations. The patient is not hyperactive.   All other systems reviewed and are negative.      Objective:    BP 118/64 (BP Location: Left Arm, Patient Position: Sitting, Cuff Size: Normal)   Pulse 88   Temp 98.6 F (37 C) (Oral)   Ht 5\' 1"  (1.549 m)   Wt 115 lb 12.8 oz (52.5 kg)   LMP 11/06/2012   SpO2 98%   BMI 21.88 kg/m    Physical Exam    General:  Well-developed,well-nourished,in no acute distress; alert,appropriate  and cooperative throughout examination Head:  normocephalic and atraumatic.   Eyes:  vision grossly intact, PERRL Ears:  R ear normal and L ear normal externally, TMs clear bilaterally Nose:  no external deformity.   Mouth:  good dentition.   Neck:  No deformities, masses, or tenderness noted. Breasts:  No mass, nodules, thickening, tenderness, bulging, retraction, inflamation, nipple discharge or skin changes noted.   Lungs:  Normal respiratory  effort, chest expands symmetrically. Lungs are clear to auscultation, no crackles or wheezes. Heart:  Normal rate and regular rhythm. S1 and S2 normal without gallop, murmur, click, rub or other extra sounds. Msk:  No deformity or scoliosis noted of thoracic or lumbar spine.   Extremities:  No clubbing, cyanosis, edema, or deformity noted with normal full range of motion of all joints.   Neurologic:  alert & oriented X3 and gait normal.   Skin:  Intact without suspicious lesions or rashes Psych:  Cognition and judgment appear intact. Alert and cooperative with normal attention span and concentration. No apparent delusions, illusions, hallucinations     Assessment & Plan:   Post concussive syndrome - Plan: Ambulatory referral to Neurology  Memory difficulties - Plan: Ambulatory referral to Neurology No follow-ups on file.

## 2017-10-08 NOTE — Patient Instructions (Addendum)
Great to see you.   We are referring you to a neurologist. Someone will be calling with that appointment.

## 2017-10-09 NOTE — Assessment & Plan Note (Signed)
The examples she gives are not concerning for dementia and she has no difficulty with MMSE. Given her concerns and prolonged post concussive symptoms, I do feel a neurology referral is prudent.  Referral placed. The patient indicates understanding of these issues and agrees with the plan.

## 2017-10-22 ENCOUNTER — Other Ambulatory Visit: Payer: Self-pay

## 2017-10-22 MED ORDER — AMPHETAMINE-DEXTROAMPHET ER 30 MG PO CP24: 30.0000 mg | ORAL_CAPSULE | ORAL | 0 refills | Status: DC

## 2017-10-22 NOTE — Progress Notes (Signed)
Printed Adderall XR for July, Aug, Sept per TA/thx dmf

## 2017-10-30 ENCOUNTER — Other Ambulatory Visit: Payer: Self-pay | Admitting: Family Medicine

## 2017-10-30 NOTE — Telephone Encounter (Signed)
Okay to refill and have her come in at her earliest convenience for a lipid panel and CMET.

## 2017-10-30 NOTE — Telephone Encounter (Signed)
Dr. Dayton MartesAron. Do you want to see the pt before we send in this Rx. Last lipid was 09/2016. No future appt made with you yet.

## 2017-11-22 ENCOUNTER — Other Ambulatory Visit: Payer: Self-pay | Admitting: Family Medicine

## 2017-11-25 ENCOUNTER — Encounter: Payer: Self-pay | Admitting: Nurse Practitioner

## 2017-11-25 ENCOUNTER — Ambulatory Visit: Payer: BLUE CROSS/BLUE SHIELD | Admitting: Nurse Practitioner

## 2017-11-25 VITALS — BP 100/80 | HR 74 | Temp 97.8°F | Ht 61.0 in | Wt 112.2 lb

## 2017-11-25 DIAGNOSIS — N3000 Acute cystitis without hematuria: Secondary | ICD-10-CM

## 2017-11-25 DIAGNOSIS — R300 Dysuria: Secondary | ICD-10-CM | POA: Diagnosis not present

## 2017-11-25 LAB — POCT URINALYSIS DIPSTICK
Bilirubin, UA: NEGATIVE
Blood, UA: NEGATIVE
Glucose, UA: NEGATIVE
Ketones, UA: NEGATIVE
LEUKOCYTES UA: NEGATIVE
Nitrite, UA: NEGATIVE
Protein, UA: NEGATIVE
Spec Grav, UA: 1.015 (ref 1.010–1.025)
Urobilinogen, UA: 0.2 E.U./dL
pH, UA: 7.5 (ref 5.0–8.0)

## 2017-11-25 MED ORDER — PHENAZOPYRIDINE HCL 100 MG PO TABS: 100.0000 mg | ORAL_TABLET | Freq: Three times a day (TID) | ORAL | 0 refills | Status: DC | PRN

## 2017-11-25 NOTE — Progress Notes (Addendum)
Subjective:  Patient ID: Tricia Munoz, female    DOB: 1960-10-18  Age: 57 y.o. MRN: [id number]  CC: Dysuria (pt c/o burning urination since yesterday , dribble ,  pt has not tried any meds ) and Urinary Frequency (pt sts she has been urinating frequently most at night for 2 days )  Dysuria   This is a new problem. The current episode started yesterday. The problem occurs every urination. The problem has been unchanged. The quality of the pain is described as burning. There has been no fever. She is sexually active. There is no history of pyelonephritis. Associated symptoms include frequency and urgency. Pertinent negatives include no chills, discharge, flank pain, hematuria, hesitancy, nausea, possible pregnancy, sweats or vomiting. She has tried increased fluids for the symptoms. The treatment provided mild relief. There is no history of catheterization, recurrent UTIs, urinary stasis or a urological procedure.   Reviewed past Medical, Social and Family history today.  Outpatient Medications Prior to Visit  Medication Sig Dispense Refill  . albuterol (PROVENTIL HFA;VENTOLIN HFA) 108 (90 Base) MCG/ACT inhaler Inhale 2 puffs into the lungs every 6 (six) hours as needed for wheezing or shortness of breath. 1 Inhaler 0  . amphetamine-dextroamphetamine (ADDERALL XR) 30 MG 24 hr capsule Take 1 capsule (30 mg total) by mouth every morning. 30 capsule 0  . azelastine (OPTIVAR) 0.05 % ophthalmic solution Apply 1 drop to eye 2 (two) times daily. 6 mL 12  . Azelastine-Fluticasone (DYMISTA) 137-50 MCG/ACT SUSP Instil 1-2 sprays bilateral nares qd prn Place into the nose. Instil 1-2 sprays bilateral nares qd prn 23 g PRN  . benazepril-hydrochlorthiazide (LOTENSIN HCT) 10-12.5 MG tablet Take 1 tablet by mouth daily. 90 tablet 1  . LATUDA 80 MG TABS tablet Take 1 tablet by mouth daily.  0  . levocetirizine (XYZAL) 5 MG tablet TAKE ONE TABLET BY MOUTH EACH EVENING 30 tablet 11  . simvastatin (ZOCOR) 10 MG  tablet TAKE 1 TABLET BY MOUTH EVERYDAY AT BEDTIME 30 tablet 1  . triamcinolone cream (KENALOG) 0.1 % Apply a thin layer to affected areas twice a day until improved. 453.6 g 2  . venlafaxine XR (EFFEXOR-XR) 150 MG 24 hr capsule TAKE ONE CAPSULE BY MOUTH DAILY 90 capsule 1   No facility-administered medications prior to visit.     ROS See HPI  Objective:  BP 100/80   Pulse 74   Temp 97.8 F (36.6 C) (Oral)   Ht 5\' 1"  (1.549 m)   Wt 112 lb 3.2 oz (50.9 kg)   LMP 11/06/2012   SpO2 98%   BMI 21.20 kg/m   BP Readings from Last 3 Encounters:  11/25/17 100/80  10/08/17 118/64  08/25/17 112/64    Wt Readings from Last 3 Encounters:  11/25/17 112 lb 3.2 oz (50.9 kg)  10/08/17 115 lb 12.8 oz (52.5 kg)  08/25/17 113 lb 12.8 oz (51.6 kg)    Physical Exam  Constitutional: She is oriented to person, place, and time.  Cardiovascular: Normal rate.  Pulmonary/Chest: Effort normal.  Abdominal: Soft. She exhibits no distension. There is tenderness. There is no guarding.  Suprapubic tenderness  Neurological: She is alert and oriented to person, place, and time.  Vitals reviewed.   Lab Results  Component Value Date   WBC 5.3 09/25/2016   HGB 12.8 09/25/2016   HCT 38.2 09/25/2016   PLT 299.0 09/25/2016   GLUCOSE 93 09/25/2016   CHOL 209 (H) 09/25/2016   TRIG 43.0 09/25/2016   HDL  94.80 09/25/2016   LDLDIRECT 112.5 04/30/2013   LDLCALC 105 (H) 09/25/2016   ALT 15 09/25/2016   AST 16 09/25/2016   NA 138 09/25/2016   K 4.0 09/25/2016   CL 102 09/25/2016   CREATININE 0.82 09/25/2016   BUN 14 09/25/2016   CO2 32 09/25/2016   TSH 2.03 09/25/2016    Dg Hand Complete Right  Result Date: 06/01/2017 CLINICAL DATA:  Fall yesterday.  Hand pain EXAM: RIGHT HAND - COMPLETE 3+ VIEW COMPARISON:  None. FINDINGS: If negative for fracture Mild degenerative change base of thumb. No erosion or bone lesion. Mild degenerative change in the DIP joints. IMPRESSION: Negative for fracture  Electronically Signed   By: Marlan Palauharles  Clark M.D.   On: 06/01/2017 14:05    Assessment & Plan:   Joetta Mannersrenia was seen today for dysuria and urinary frequency.  Diagnoses and all orders for this visit:  Acute cystitis without hematuria -     POCT Urinalysis Dipstick -     Urine Culture -     phenazopyridine (PYRIDIUM) 100 MG tablet; Take 1 tablet (100 mg total) by mouth 3 (three) times daily as needed for pain (with food). -     ciprofloxacin (CIPRO) 250 MG tablet; Take 1 tablet (250 mg total) by mouth 2 (two) times daily.   I am having Kiaraliz B. Mable start on phenazopyridine and ciprofloxacin. I am also having her maintain her albuterol, triamcinolone cream, levocetirizine, azelastine, Azelastine-Fluticasone, benazepril-hydrochlorthiazide, LATUDA, venlafaxine XR, amphetamine-dextroamphetamine, and simvastatin.  Meds ordered this encounter  Medications  . phenazopyridine (PYRIDIUM) 100 MG tablet    Sig: Take 1 tablet (100 mg total) by mouth 3 (three) times daily as needed for pain (with food).    Dispense:  10 tablet    Refill:  0    Order Specific Question:   Supervising Provider    Answer:   Clare GandySCHMITZ, JEREMY E [[id number]]  . ciprofloxacin (CIPRO) 250 MG tablet    Sig: Take 1 tablet (250 mg total) by mouth 2 (two) times daily.    Dispense:  10 tablet    Refill:  0    Order Specific Question:   Supervising Provider    Answer:   Clare GandySCHMITZ, JEREMY E [[id number]]    Follow-up: Return if symptoms worsen or fail to improve.  Alysia Pennaharlotte Simpson Paulos, NP

## 2017-11-25 NOTE — Patient Instructions (Addendum)
Continue to maintain adequate oral hydration. Urinalysis looks normal, but urine sent for urine culture.  Use pyridium for urinary discomfort.

## 2017-11-26 ENCOUNTER — Telehealth: Payer: Self-pay | Admitting: Family Medicine

## 2017-11-26 DIAGNOSIS — R300 Dysuria: Secondary | ICD-10-CM

## 2017-11-26 MED ORDER — CEPHALEXIN 250 MG PO CAPS: 250.0000 mg | ORAL_CAPSULE | Freq: Two times a day (BID) | ORAL | 0 refills | Status: DC

## 2017-11-26 NOTE — Telephone Encounter (Signed)
Spoke with the pt, she said still some discomfort when urinate "felt like inside will fall out" if she doesn't take pyridium. Pt stated she she had uti before and without pyridium it is very painful.    Advise the pt to take pyridium prn for pain  and drink a lot of water while we waiting for urine culture to come back.   Tricia Munoz please advise.

## 2017-11-26 NOTE — Telephone Encounter (Signed)
Copied from CRM [id number]. Topic: Quick Communication - See Telephone Encounter >> Nov 26, 2017  9:38 AM Trula SladeWalter, Linda F wrote: CRM for notification. See Telephone encounter for: 11/26/17. Patient was in the office yesterday, 11/25/17 for a UTI, but the culture didn't show she had a UTI.  The patient stated she has had enough of them to know she has one, and she is in a lot of pain when she urinates.  She said she believes the culture didn't show a UTI because she had drank so much water.  She would like something for a UTI called into [place] [facility] pharmacy [facility] [place].

## 2017-11-26 NOTE — Telephone Encounter (Signed)
Pt is aware.  

## 2017-11-27 ENCOUNTER — Encounter: Payer: Self-pay | Admitting: Nurse Practitioner

## 2017-11-27 LAB — URINE CULTURE
MICRO NUMBER:: 90959258
SPECIMEN QUALITY: ADEQUATE

## 2017-11-27 MED ORDER — CIPROFLOXACIN HCL 250 MG PO TABS: 250.0000 mg | ORAL_TABLET | Freq: Two times a day (BID) | ORAL | 0 refills | Status: DC

## 2017-11-27 NOTE — Addendum Note (Signed)
Addended by: Alysia PennaNCHE, Tahmir Kleckner L on: 11/27/2017 05:46 PM   Modules accepted: Orders, Level of Service

## 2017-12-11 ENCOUNTER — Encounter: Payer: Self-pay | Admitting: Neurology

## 2017-12-11 ENCOUNTER — Ambulatory Visit: Payer: BLUE CROSS/BLUE SHIELD | Admitting: Neurology

## 2017-12-11 ENCOUNTER — Other Ambulatory Visit: Payer: Self-pay | Admitting: Family Medicine

## 2017-12-11 VITALS — BP 131/86 | HR 102 | Ht 62.0 in | Wt 114.0 lb

## 2017-12-11 DIAGNOSIS — R4789 Other speech disturbances: Secondary | ICD-10-CM | POA: Diagnosis not present

## 2017-12-11 DIAGNOSIS — G4719 Other hypersomnia: Secondary | ICD-10-CM | POA: Diagnosis not present

## 2017-12-11 DIAGNOSIS — R42 Dizziness and giddiness: Secondary | ICD-10-CM

## 2017-12-11 DIAGNOSIS — F909 Attention-deficit hyperactivity disorder, unspecified type: Secondary | ICD-10-CM | POA: Diagnosis not present

## 2017-12-11 DIAGNOSIS — F0781 Postconcussional syndrome: Secondary | ICD-10-CM | POA: Diagnosis not present
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
## 2017-12-18 ENCOUNTER — Other Ambulatory Visit: Payer: Self-pay | Admitting: Family Medicine

## 2018-01-08 ENCOUNTER — Other Ambulatory Visit: Payer: Self-pay | Admitting: Family Medicine

## 2018-01-08 MED ORDER — AMPHETAMINE-DEXTROAMPHET ER 30 MG PO CP24: 30.0000 mg | ORAL_CAPSULE | ORAL | 0 refills | Status: DC

## 2018-01-08 NOTE — Telephone Encounter (Signed)
TA-I corrected for "October" for you to be able to send to pharm/thx dmf

## 2018-01-08 NOTE — Telephone Encounter (Signed)
Adderall XR refill Last Refill:10/22/17 # 30  0 refills Last OV: 11/25/17 PCP: Dayton Martes Pharmacy:[facility] [address] [place]

## 2018-01-08 NOTE — Telephone Encounter (Signed)
Copied from CRM [id number]. Topic: Quick Communication - Rx Refill/Question >> Jan 08, 2018  8:55 AM Gloriann Loan L wrote: Medication: amphetamine-dextroamphetamine (ADDERALL XR) 30 MG 24 hr capsule pt has an appt on 01-26-18 she will be going out of town from 01-18-18 thru 01-22-18  Has the patient contacted their pharmacy? Yes.   (Agent: If no, request that the patient contact the pharmacy for the refill.) (Agent: If yes, when and what did the pharmacy advise?) call provider  Preferred Pharmacy (with phone number or street name): [id number] [place] - [address] [phone] (Phone) [phone] (Fax)    Agent: Please be advised that RX refills may take up to 3 business days. We ask that you follow-up with your pharmacy.

## 2018-01-17 ENCOUNTER — Other Ambulatory Visit: Payer: Self-pay | Admitting: Family Medicine

## 2018-01-19 ENCOUNTER — Ambulatory Visit: Payer: Self-pay | Admitting: Family Medicine

## 2018-01-26 ENCOUNTER — Encounter: Payer: Self-pay | Admitting: Family Medicine

## 2018-01-26 ENCOUNTER — Ambulatory Visit: Payer: BLUE CROSS/BLUE SHIELD | Admitting: Family Medicine

## 2018-01-26 VITALS — BP 122/76 | HR 60 | Temp 98.7°F | Ht 62.0 in | Wt 116.0 lb

## 2018-01-26 DIAGNOSIS — Z79899 Other long term (current) drug therapy: Secondary | ICD-10-CM

## 2018-01-26 DIAGNOSIS — E785 Hyperlipidemia, unspecified: Secondary | ICD-10-CM

## 2018-01-26 DIAGNOSIS — F909 Attention-deficit hyperactivity disorder, unspecified type: Secondary | ICD-10-CM | POA: Diagnosis not present

## 2018-01-26 DIAGNOSIS — I10 Essential (primary) hypertension: Secondary | ICD-10-CM

## 2018-01-26 MED ORDER — HYDROCHLOROTHIAZIDE 25 MG PO TABS: 12.5000 mg | ORAL_TABLET | Freq: Every day | ORAL | 3 refills | Status: DC

## 2018-01-26 MED ORDER — AMPHETAMINE-DEXTROAMPHET ER 30 MG PO CP24: 30.0000 mg | ORAL_CAPSULE | ORAL | 0 refills | Status: DC

## 2018-01-26 MED ORDER — BENAZEPRIL HCL 20 MG PO TABS: 10.0000 mg | ORAL_TABLET | Freq: Every day | ORAL | 3 refills | Status: DC

## 2018-01-26 NOTE — Patient Instructions (Addendum)
Great to see you. I will call you with your lab results from today and you can view them online.    Please STOP taking your current medication- benazepril 10-HCTZ 12.5 mg daily. Start taking Benazepril 20 mg - 1/2 tab by mouth daily (10 mg daily) and HCTZ 25 mg - 1/2 tab by mouth daily (12.5 mg daily).

## 2018-01-26 NOTE — Assessment & Plan Note (Signed)
Continue current dose of Adderrall- rx refilled today. PMP reviewed- no red flags. UDS and CSC updated today.

## 2018-01-26 NOTE — Progress Notes (Signed)
Subjective:   Patient ID: [facility], female    DOB: 1960-10-30, 57 y.o.   MRN: [id number]  [facility]ricia [facility] is a pleasant 57 y.o. year old female who presents to clinic today with Follow-up (Patient is here today for a F/U with ADHD.  Update CSC & UDS, [place] PMP ok no red flags.  )  on 01/26/2018  HPI:  ADD- currently taking Adderall XR 30 mg daily.  HTN- has been well controlled on Lotensin HCT 10-12.5 mg daily.  This rx has been on back order at every pharmacy. Lab Results  Component Value Date   CREATININE 0.82 09/25/2016   HLD- on zocor 10 mg daily.  Lab Results  Component Value Date   CHOL 209 (H) 09/25/2016   HDL 94.80 09/25/2016   LDLCALC 105 (H) 09/25/2016   LDLDIRECT 112.5 04/30/2013   TRIG 43.0 09/25/2016   CHOLHDL 2 09/25/2016     Current Outpatient Medications on File Prior to Visit  Medication Sig Dispense Refill  . albuterol (PROVENTIL HFA;VENTOLIN HFA) 108 (90 Base) MCG/ACT inhaler Inhale 2 puffs into the lungs every 6 (six) hours as needed for wheezing or shortness of breath. 1 Inhaler 0  . azelastine (OPTIVAR) 0.05 % ophthalmic solution Apply 1 drop to eye 2 (two) times daily. 6 mL 12  . Azelastine-Fluticasone (DYMISTA) 137-50 MCG/ACT SUSP Instil 1-2 sprays bilateral nares qd prn Place into the nose. Instil 1-2 sprays bilateral nares qd prn 23 g PRN  . Cholecalciferol (VITAMIN D) 2000 units CAPS Take by mouth.    . Cyanocobalamin (VITAMIN B 12 PO) Take 3,000 mcg by mouth.    . levocetirizine (XYZAL) 5 MG tablet TAKE ONE TABLET BY MOUTH EACH EVENING 30 tablet 11  . Multiple Vitamins-Minerals (WOMENS MULTIVITAMIN PO) Take by mouth.    . simvastatin (ZOCOR) 10 MG tablet TAKE 1 TABLET BY MOUTH EVERYDAY AT BEDTIME 30 tablet 1  . triamcinolone cream (KENALOG) 0.1 % Apply a thin layer to affected areas twice a day until improved. 453.6 g 2  . venlafaxine XR (EFFEXOR-XR) 150 MG 24 hr capsule TAKE ONE CAPSULE BY MOUTH DAILY 90 capsule 1  . vitamin C (ASCORBIC  ACID) 250 MG tablet Take 250 mg by mouth daily.     No current facility-administered medications on file prior to visit.     Allergies  Allergen Reactions  . Evekeo [Amphetamine Sulfate]   . Montelukast     itch  . Singulair [Montelukast Sodium] Rash    Past Medical History:  Diagnosis Date  . ADHD   . Asthma   . Hypertension   . OA (osteoarthritis)   . Osteopenia     No past surgical history on file.  Family History  Problem Relation Age of Onset  . Dementia Mother   . Hypertension Father   . Allergic rhinitis Father   . Kidney disease Sister   . Angioedema Neg Hx   . Asthma Neg Hx   . Eczema Neg Hx   . Immunodeficiency Neg Hx   . Urticaria Neg Hx     Social History   Socioeconomic History  . Marital status: Married    Spouse name: Not on file  . Number of children: 1  . Years of education: Not on file  . Highest education level: Not on file  Occupational History  . Occupation: self employeed  Social Needs  . Financial resource strain: Not on file  . Food insecurity:    Worry: Not on file  Inability: Not on file  . Transportation needs:    Medical: Not on file    Non-medical: Not on file  Tobacco Use  . Smoking status: Never Smoker  . Smokeless tobacco: Never Used  Substance and Sexual Activity  . Alcohol use: No  . Drug use: No  . Sexual activity: Not on file  Lifestyle  . Physical activity:    Days per week: Not on file    Minutes per session: Not on file  . Stress: Not on file  Relationships  . Social connections:    Talks on phone: Not on file    Gets together: Not on file    Attends religious service: Not on file    Active member of club or organization: Not on file    Attends meetings of clubs or organizations: Not on file    Relationship status: Not on file  . Intimate partner violence:    Fear of current or ex partner: Not on file    Emotionally abused: Not on file    Physically abused: Not on file    Forced sexual activity:  Not on file  Other Topics Concern  . Not on file  Social History Narrative   Lives in [place] with husband. 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   Review of Systems  Constitutional: Negative.   HENT: Negative.   Respiratory: Negative.   Cardiovascular: Negative.   Gastrointestinal: Negative.   Endocrine: Negative.   Genitourinary: Negative.   Musculoskeletal: Negative.   Neurological: Negative.   Hematological: Negative.   Psychiatric/Behavioral: Negative.   All other systems reviewed and are negative.      Objective:    BP 122/76 (BP Location: Left Arm, Patient Position: Sitting, Cuff Size: Normal)   Pulse 60   Temp 98.7 F (37.1 C) (Oral)   Ht 5\' 2"  (1.575 m)   Wt 116 lb (52.6 kg)   LMP 11/06/2012   SpO2 96%   BMI 21.22 kg/m   Wt Readings from Last 3 Encounters:  01/26/18 116 lb (52.6 kg)  12/11/17 114 lb (51.7 kg)  11/25/17 112 lb 3.2 oz (50.9 kg)      Physical Exam  Constitutional: She is oriented to person, place, and time. She appears well-developed and well-nourished. No distress.  HENT:  Head: Normocephalic and atraumatic.  Eyes: EOM are normal.  Neck: Normal range of motion.  Cardiovascular: Normal rate and regular rhythm.  Pulmonary/Chest: Effort normal and breath sounds normal.  Musculoskeletal: Normal range of motion. She exhibits no edema.  Neurological: She is alert and oriented to person, place, and time. No cranial nerve deficit.  Skin: Skin is warm and dry. She is not diaphoretic.  Psychiatric: She has a normal mood and affect. Her behavior is normal. Judgment and thought content normal.  Nursing note and vitals reviewed.         Assessment & Plan:   Adult ADHD (attention deficit hyperactivity disorder) - Plan: Pain Mgmt, Profile 8  w/Conf, U  Essential hypertension - Plan: TSH, CBC with Differential/Platelet  Long term use of drug - Plan: Pain Mgmt, Profile 8 w/Conf, U  Hyperlipidemia, unspecified hyperlipidemia type - Plan: Lipid panel, Comprehensive metabolic panel, TSH, CBC with Differential/Platelet No follow-ups on file.

## 2018-01-26 NOTE — Assessment & Plan Note (Signed)
On zocor. Due for labs- will check today. The patient indicates understanding of these issues and agrees with the plan.

## 2018-01-26 NOTE — Assessment & Plan Note (Signed)
Well controlled. D/c lotensin HCT because on back order and will start taking benazepril 10 mg daily and HCTZ 12.5 mg daily as two separate tablets. The patient indicates understanding of these issues and agrees with the plan.

## 2018-01-27 LAB — LIPID PANEL
Cholesterol: 219 mg/dL — ABNORMAL HIGH (ref 0–200)
HDL: 83.4 mg/dL (ref >39.00)
LDL CALC: 102 mg/dL — AB (ref 0–99)
NONHDL: 136.01
Total CHOL/HDL Ratio: 3
Triglycerides: 171 mg/dL — ABNORMAL HIGH (ref 0.0–149.0)
VLDL: 34.2 mg/dL (ref 0.0–40.0)

## 2018-01-27 LAB — CBC WITH DIFFERENTIAL/PLATELET
BASOS ABS: 0.1 10*3/uL (ref 0.0–0.1)
Basophils Relative: 1 % (ref 0.0–3.0)
EOS PCT: 0.5 % (ref 0.0–5.0)
Eosinophils Absolute: 0 10*3/uL (ref 0.0–0.7)
HCT: 39.7 % (ref 36.0–46.0)
HEMOGLOBIN: 13.3 g/dL (ref 12.0–15.0)
LYMPHS ABS: 2.1 10*3/uL (ref 0.7–4.0)
LYMPHS PCT: 32.3 % (ref 12.0–46.0)
MCHC: 33.4 g/dL (ref 30.0–36.0)
MCV: 95.9 fl (ref 78.0–100.0)
MONO ABS: 0.5 10*3/uL (ref 0.1–1.0)
MONOS PCT: 8.3 % (ref 3.0–12.0)
Neutro Abs: 3.7 10*3/uL (ref 1.4–7.7)
Neutrophils Relative %: 57.9 % (ref 43.0–77.0)
Platelets: 332 10*3/uL (ref 150.0–400.0)
RBC: 4.14 Mil/uL (ref 3.87–5.11)
RDW: 13.2 % (ref 11.5–15.5)
WBC: 6.4 10*3/uL (ref 4.0–10.5)

## 2018-01-27 LAB — COMPREHENSIVE METABOLIC PANEL
ALBUMIN: 4.5 g/dL (ref 3.5–5.2)
ALK PHOS: 100 U/L (ref 39–117)
ALT: 15 U/L (ref 0–35)
AST: 16 U/L (ref 0–37)
BUN: 15 mg/dL (ref 6–23)
CHLORIDE: 101 meq/L (ref 96–112)
CO2: 32 mEq/L (ref 19–32)
Calcium: 10 mg/dL (ref 8.4–10.5)
Creatinine, Ser: 0.78 mg/dL (ref 0.40–1.20)
GFR: 80.28 mL/min (ref >60.00)
GLUCOSE: 95 mg/dL (ref 70–99)
POTASSIUM: 4.2 meq/L (ref 3.5–5.1)
SODIUM: 138 meq/L (ref 135–145)
TOTAL PROTEIN: 7.7 g/dL (ref 6.0–8.3)
Total Bilirubin: 0.3 mg/dL (ref 0.2–1.2)

## 2018-01-27 LAB — TSH: TSH: 2.16 u[IU]/mL (ref 0.35–4.50)

## 2018-01-29 LAB — PAIN MGMT, PROFILE 8 W/CONF, U
6 ACETYLMORPHINE: NEGATIVE ng/mL (ref <10)
ALCOHOL METABOLITES: NEGATIVE ng/mL (ref <500)
Amphetamine: 1237 ng/mL — ABNORMAL HIGH (ref <250)
Amphetamines: POSITIVE ng/mL — AB (ref <500)
BENZODIAZEPINES: NEGATIVE ng/mL (ref <100)
Buprenorphine, Urine: NEGATIVE ng/mL (ref <5)
COCAINE METABOLITE: NEGATIVE ng/mL (ref <150)
Creatinine: 80.6 mg/dL
MARIJUANA METABOLITE: 376 ng/mL — AB (ref <5)
MDMA: NEGATIVE ng/mL (ref <500)
Marijuana Metabolite: POSITIVE ng/mL — AB (ref <20)
Methamphetamine: NEGATIVE ng/mL (ref <250)
Opiates: NEGATIVE ng/mL (ref <100)
Oxidant: NEGATIVE ug/mL (ref <200)
Oxycodone: NEGATIVE ng/mL (ref <100)
pH: 7.32 (ref 4.5–9.0)

## 2018-02-05 ENCOUNTER — Other Ambulatory Visit: Payer: Self-pay | Admitting: Family Medicine

## 2018-02-19 ENCOUNTER — Ambulatory Visit: Payer: BLUE CROSS/BLUE SHIELD | Admitting: Family Medicine

## 2018-02-19 ENCOUNTER — Encounter: Payer: Self-pay | Admitting: Family Medicine

## 2018-02-19 VITALS — BP 116/62 | HR 75 | Temp 98.3°F | Ht 62.0 in | Wt 117.0 lb

## 2018-02-19 DIAGNOSIS — J019 Acute sinusitis, unspecified: Secondary | ICD-10-CM | POA: Diagnosis not present

## 2018-02-19 MED ORDER — AZELASTINE-FLUTICASONE 137-50 MCG/ACT NA SUSP: NASAL | 3 refills | Status: DC

## 2018-02-19 MED ORDER — AZITHROMYCIN 250 MG PO TABS: ORAL_TABLET | ORAL | 0 refills | Status: DC

## 2018-02-19 NOTE — Patient Instructions (Signed)
Great to see you. Take zpack as directed.  Use nasal spray, continue mucinex.  Drive safely to your dad's and keep me updated.

## 2018-02-19 NOTE — Progress Notes (Signed)
SUBJECTIVE:  Tricia Munoz is a 57 y.o. female who complains of coryza, congestion, sore throat, productive cough, myalgias and bilateral sinus pain for 14 days. She denies a history of anorexia, chest pain and chills and denies a history of asthma. Patient denies smoke cigarettes.   Current Outpatient Medications on File Prior to Visit  Medication Sig Dispense Refill  . albuterol (PROVENTIL HFA;VENTOLIN HFA) 108 (90 Base) MCG/ACT inhaler Inhale 2 puffs into the lungs every 6 (six) hours as needed for wheezing or shortness of breath. 1 Inhaler 0  . amphetamine-dextroamphetamine (ADDERALL XR) 30 MG 24 hr capsule Take 1 capsule (30 mg total) by mouth every morning. 30 capsule 0  . azelastine (OPTIVAR) 0.05 % ophthalmic solution Apply 1 drop to eye 2 (two) times daily. 6 mL 12  . Azelastine-Fluticasone (DYMISTA) 137-50 MCG/ACT SUSP Instil 1-2 sprays bilateral nares qd prn Place into the nose. Instil 1-2 sprays bilateral nares qd prn 23 g PRN  . benazepril (LOTENSIN) 20 MG tablet Take 0.5 tablets (10 mg total) by mouth daily. 90 tablet 3  . Cholecalciferol (VITAMIN D) 2000 units CAPS Take by mouth.    . Cyanocobalamin (VITAMIN B 12 PO) Take 3,000 mcg by mouth.    . hydrochlorothiazide (HYDRODIURIL) 25 MG tablet Take 0.5 tablets (12.5 mg total) by mouth daily. 90 tablet 3  . LATUDA 80 MG TABS tablet TAKE 1 TABLET BY MOUTH EVERY DAY WITH BREAKFAST  3  . levocetirizine (XYZAL) 5 MG tablet TAKE ONE TABLET BY MOUTH EACH EVENING 30 tablet 11  . Multiple Vitamins-Minerals (WOMENS MULTIVITAMIN PO) Take by mouth.    . simvastatin (ZOCOR) 10 MG tablet TAKE 1 TABLET BY MOUTH EVERYDAY AT BEDTIME 30 tablet 1  . triamcinolone cream (KENALOG) 0.1 % Apply a thin layer to affected areas twice a day until improved. 453.6 g 2  . venlafaxine XR (EFFEXOR-XR) 150 MG 24 hr capsule TAKE ONE CAPSULE BY MOUTH DAILY 90 capsule 1  . vitamin C (ASCORBIC ACID) 250 MG tablet Take 250 mg by mouth daily.     No current  facility-administered medications on file prior to visit.     Allergies  Allergen Reactions  . Evekeo [Amphetamine Sulfate]   . Montelukast     itch  . Singulair [Montelukast Sodium] Rash    Past Medical History:  Diagnosis Date  . ADHD   . Asthma   . Hypertension   . OA (osteoarthritis)   . Osteopenia     No past surgical history on file.  Family History  Problem Relation Age of Onset  . Dementia Mother   . Hypertension Father   . Allergic rhinitis Father   . Kidney disease Sister   . Angioedema Neg Hx   . Asthma Neg Hx   . Eczema Neg Hx   . Immunodeficiency Neg Hx   . Urticaria Neg Hx     Social History   Socioeconomic History  . Marital status: Married    Spouse name: Not on file  . Number of children: 1  . Years of education: Not on file  . Highest education level: Not on file  Occupational History  . Occupation: self employeed  Social Needs  . Financial resource strain: Not on file  . Food insecurity:    Worry: Not on file    Inability: Not on file  . Transportation needs:    Medical: Not on file    Non-medical: Not on file  Tobacco Use  . Smoking status: Never  Smoker  . Smokeless tobacco: Never Used  Substance and Sexual Activity  . Alcohol use: No  . Drug use: No  . Sexual activity: Not on file  Lifestyle  . Physical activity:    Days per week: Not on file    Minutes per session: Not on file  . Stress: Not on file  Relationships  . Social connections:    Talks on phone: Not on file    Gets together: Not on file    Attends religious service: Not on file    Active member of club or organization: Not on file    Attends meetings of clubs or organizations: Not on file    Relationship status: Not on file  . Intimate partner violence:    Fear of current or ex partner: Not on file    Emotionally abused: Not on file    Physically abused: Not on file    Forced sexual activity: Not on file  Other Topics Concern  . Not on file  Social History  Narrative   Lives in [place] with husband. 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  OBJECTIVE: BP 116/62   Pulse 75   Temp 98.3 F (36.8 C) (Oral)   Ht 5\' 2"  (1.575 m)   Wt 117 lb (53.1 kg)   LMP 11/06/2012   SpO2 100%   BMI 21.40 kg/m   She appears well, vital signs are as noted. Ears normal.  Throat and pharynx normal.  Neck supple. No adenopathy in the neck. Nose is congested. Sinuses tender. The chest is clear, without wheezes or rales.  ASSESSMENT:  sinusitis  PLAN: Given duration and progression of symptoms, will treat for bacterial sinusitis with zpack.  Symptomatic therapy suggested: push fluids, rest and return office visit prn if symptoms persist or worsen.Call or return to clinic prn if these symptoms worsen or fail to improve as anticipated.

## 2018-03-05 ENCOUNTER — Other Ambulatory Visit: Payer: Self-pay | Admitting: Family Medicine

## 2018-03-06 ENCOUNTER — Other Ambulatory Visit: Payer: Self-pay | Admitting: Allergy

## 2018-03-06 ENCOUNTER — Other Ambulatory Visit: Payer: Self-pay | Admitting: Family Medicine

## 2018-03-06 DIAGNOSIS — L298 Other pruritus: Secondary | ICD-10-CM

## 2018-03-06 DIAGNOSIS — Z1231 Encounter for screening mammogram for malignant neoplasm of breast: Secondary | ICD-10-CM

## 2018-03-10 ENCOUNTER — Ambulatory Visit
Admission: RE | Admit: 2018-03-10 | Discharge: 2018-03-10 | Disposition: A | Discharge status: Home / Self Care | Payer: BLUE CROSS/BLUE SHIELD | Source: Ambulatory Visit | Attending: Family Medicine | Admitting: Family Medicine

## 2018-03-10 ENCOUNTER — Other Ambulatory Visit: Payer: Self-pay | Admitting: *Deleted

## 2018-03-10 DIAGNOSIS — L298 Other pruritus: Secondary | ICD-10-CM

## 2018-03-10 DIAGNOSIS — Z1231 Encounter for screening mammogram for malignant neoplasm of breast: Secondary | ICD-10-CM | POA: Diagnosis not present

## 2018-03-10 MED ORDER — LEVOCETIRIZINE DIHYDROCHLORIDE 5 MG PO TABS: ORAL_TABLET | ORAL | 11 refills | Status: DC

## 2018-03-14 ENCOUNTER — Other Ambulatory Visit: Payer: Self-pay | Admitting: Family Medicine

## 2018-03-31 ENCOUNTER — Other Ambulatory Visit: Payer: Self-pay | Admitting: Family Medicine

## 2018-04-13 ENCOUNTER — Other Ambulatory Visit: Payer: Self-pay

## 2018-04-13 MED ORDER — AMPHETAMINE-DEXTROAMPHET ER 30 MG PO CP24: 30.0000 mg | ORAL_CAPSULE | ORAL | 0 refills | Status: DC

## 2018-04-13 NOTE — Telephone Encounter (Signed)
TA-Pt is not due to come in until April as everything was updated in October (UDS & CSC & PMP ok no red flags) I advised her that unless she has something that she needs to be seen for then she can just schedule for April and we would send in her Adderall XR/It is ready to approve/thx dmf

## 2018-04-14 ENCOUNTER — Ambulatory Visit: Payer: Self-pay | Admitting: Family Medicine

## 2018-05-04 ENCOUNTER — Other Ambulatory Visit: Payer: Self-pay | Admitting: Family Medicine

## 2018-05-25 ENCOUNTER — Encounter: Payer: Self-pay | Admitting: Family Medicine

## 2018-05-25 ENCOUNTER — Ambulatory Visit: Payer: BLUE CROSS/BLUE SHIELD | Admitting: Family Medicine

## 2018-05-25 VITALS — BP 136/84 | HR 74 | Temp 98.8°F | Ht 62.0 in | Wt 117.0 lb

## 2018-05-25 DIAGNOSIS — L989 Disorder of the skin and subcutaneous tissue, unspecified: Secondary | ICD-10-CM | POA: Diagnosis not present

## 2018-05-25 DIAGNOSIS — F32A Depression, unspecified: Secondary | ICD-10-CM

## 2018-05-25 DIAGNOSIS — F909 Attention-deficit hyperactivity disorder, unspecified type: Secondary | ICD-10-CM | POA: Diagnosis not present

## 2018-05-25 DIAGNOSIS — F329 Major depressive disorder, single episode, unspecified: Secondary | ICD-10-CM | POA: Diagnosis not present

## 2018-05-25 MED ORDER — VENLAFAXINE HCL ER 75 MG PO CP24: 75.0000 mg | ORAL_CAPSULE | Freq: Every day | ORAL | 3 refills | Status: DC

## 2018-05-25 MED ORDER — CLOTRIMAZOLE-BETAMETHASONE 1-0.05 % EX CREA: 1.0000 "application " | TOPICAL_CREAM | Freq: Two times a day (BID) | CUTANEOUS | 0 refills | Status: DC

## 2018-05-25 NOTE — Assessment & Plan Note (Signed)
>  25 minutes spent in face to face time with patient, >50% spent in counselling or coordination of care She has stopped adderrall for now. She would like to try a lower dose of effexor. eRx sent for effexor 75 mg XR daily- she can alternate between 150 mg XR and 75 mg XR every other day for two weeks before taking 75 mg XR daily. She will keep me updated. The patient indicates understanding of these issues and agrees with the plan.

## 2018-05-25 NOTE — Assessment & Plan Note (Signed)
New- almost has a fungal look to it- will refer to derm. Place on lotrisone twice daily (very small amounts) but not to take for more than 10 days without updating me.

## 2018-05-25 NOTE — Progress Notes (Signed)
Subjective:   Patient ID: Tricia Munoz, female    DOB: 08/02/1960, 58 y.o.   MRN: [id number]  Tricia Munoz is a pleasant 58 y.o. year old female who presents to clinic today with Skin Problem (Patient is here today C/O a spot on her right cheek that showed up 4-6 weeks ago.  It itches sometimes.  She cannot get into a derm until July.  It measures 31mm x 58mm.) and Follow-up (She states that she D/C'ed Adderall in December and feels very well.  She would like to see about decreasing her depression medication to see if it is now in remission.)  on 05/25/2018  HPI:  Depression and ADHD-  Stopped taking adderall in December.  She feels it made her want to "go go go."  She feels better.  Denies feeling depressed or anxious.  Sleeping well.  Wants to try to wean down dose of effexor.     Skin lesion- right cheek- noticed it few weeks ago. Feels bumpy, itchy.  Not tried anything for it. No knew soaps or detergents that she is aware of.   Current Outpatient Medications on File Prior to Visit  Medication Sig Dispense Refill  . albuterol (PROVENTIL HFA;VENTOLIN HFA) 108 (90 Base) MCG/ACT inhaler Inhale 2 puffs into the lungs every 6 (six) hours as needed for wheezing or shortness of breath. 1 Inhaler 0  . azelastine (OPTIVAR) 0.05 % ophthalmic solution Apply 1 drop to eye 2 (two) times daily. 6 mL 12  . Azelastine-Fluticasone 137-50 MCG/ACT SUSP 2 spray in each nostril daily 1 Bottle 3  . benazepril (LOTENSIN) 20 MG tablet Take 0.5 tablets (10 mg total) by mouth daily. 90 tablet 3  . Cholecalciferol (VITAMIN D) 2000 units CAPS Take by mouth.    . Cyanocobalamin (VITAMIN B 12 PO) Take 3,000 mcg by mouth.    . hydrochlorothiazide (HYDRODIURIL) 25 MG tablet Take 0.5 tablets (12.5 mg total) by mouth daily. 90 tablet 3  . levocetirizine (XYZAL) 5 MG tablet TAKE ONE TABLET BY MOUTH EACH EVENING 30 tablet 11  . Multiple Vitamins-Minerals (WOMENS MULTIVITAMIN PO) Take by mouth.    . simvastatin  (ZOCOR) 10 MG tablet TAKE 1 TABLET BY MOUTH EVERYDAY AT BEDTIME 30 tablet 1  . triamcinolone cream (KENALOG) 0.1 % Apply a thin layer to affected areas twice a day until improved. 453.6 g 2  . vitamin C (ASCORBIC ACID) 250 MG tablet Take 250 mg by mouth daily.    Marland Kitchen amphetamine-dextroamphetamine (ADDERALL XR) 30 MG 24 hr capsule Take 1 capsule (30 mg total) by mouth every morning. (Patient not taking: Reported on 05/25/2018) 30 capsule 0   No current facility-administered medications on file prior to visit.     Allergies  Allergen Reactions  . Evekeo [Amphetamine Sulfate]   . Montelukast     itch  . Singulair [Montelukast Sodium] Rash    Past Medical History:  Diagnosis Date  . ADHD   . Asthma   . Hypertension   . OA (osteoarthritis)   . Osteopenia     No past surgical history on file.  Family History  Problem Relation Age of Onset  . Dementia Mother   . Hypertension Father   . Allergic rhinitis Father   . Kidney disease Sister   . Breast cancer Maternal Aunt 60       1/2 sister  . Angioedema Neg Hx   . Asthma Neg Hx   . Eczema Neg Hx   . Immunodeficiency Neg  Hx   . Urticaria Neg Hx     Social History   Socioeconomic History  . Marital status: Married    Spouse name: Not on file  . Number of children: 1  . Years of education: Not on file  . Highest education level: Not on file  Occupational History  . Occupation: self employeed  Social Needs  . Financial resource strain: Not on file  . Food insecurity:    Worry: Not on file    Inability: Not on file  . Transportation needs:    Medical: Not on file    Non-medical: Not on file  Tobacco Use  . Smoking status: Never Smoker  . Smokeless tobacco: Never Used  Substance and Sexual Activity  . Alcohol use: No  . Drug use: No  . Sexual activity: Not on file  Lifestyle  . Physical activity:    Days per week: Not on file    Minutes per session: Not on file  . Stress: Not on file  Relationships  . Social  connections:    Talks on phone: Not on file    Gets together: Not on file    Attends religious service: Not on file    Active member of club or organization: Not on file    Attends meetings of clubs or organizations: Not on file    Relationship status: Not on file  . Intimate partner violence:    Fear of current or ex partner: Not on file    Emotionally abused: Not on file    Physically abused: Not on file    Forced sexual activity: Not on file  Other Topics Concern  . Not on file  Social History Narrative   Lives in [place] with husband. 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  Review of Systems  Constitutional: Negative.   Skin: Positive for rash.  Psychiatric/Behavioral: Negative for agitation, behavioral problems, confusion, decreased concentration, dysphoric mood, hallucinations, self-injury, sleep disturbance and suicidal ideas. The patient is not nervous/anxious and is not hyperactive.   All other systems reviewed and are negative.      Objective:    BP 136/84 (BP Location: Left Arm, Patient Position: Sitting, Cuff Size: Normal)   Pulse 74   Temp 98.8 F (37.1 C) (Oral)   Ht 5\' 2"  (1.575 m)   Wt 117 lb (53.1 kg)   LMP 11/06/2012   SpO2 97%   BMI 21.40 kg/m    Physical Exam Vitals signs and nursing note reviewed.  Constitutional:      Appearance: Normal appearance.  HENT:     Head: Normocephalic and atraumatic.     Nose: Nose normal.     Mouth/Throat:     Mouth: Mucous membranes are moist.  Eyes:     Extraocular Movements: Extraocular movements intact.  Neck:     Musculoskeletal: Normal range of motion.  Cardiovascular:     Rate and Rhythm: Normal rate.  Pulmonary:     Effort: Pulmonary effort is normal.  Musculoskeletal: Normal range of motion.   Skin:    Findings: Lesion present.     Comments: Right cheek- small raised area, irregular, almost has a fungal appearance  Neurological:     General: No focal deficit present.     Mental  Status: She is alert and oriented to person, place, and time.     Cranial Nerves: No cranial nerve deficit.  Psychiatric:        Mood and Affect: Mood normal.        Behavior: Behavior normal.        Judgment: Judgment normal.           Assessment & Plan:   Adult ADHD (attention deficit hyperactivity disorder)  Depression, unspecified depression type  Skin lesion - Plan: Ambulatory referral to Dermatology No follow-ups on file.

## 2018-05-25 NOTE — Patient Instructions (Addendum)
We are decreasing your effexor to to 75 mg daily- okay to alternate taking 75 mg one day, then 150 mg the next, etc for two weeks before starting 75 mg every day.  Place small amount of lotrisone on area twice daily for no more than 10 days without updating me.  We are referring you to dermatology.  Keep me updated.

## 2018-06-01 ENCOUNTER — Ambulatory Visit: Payer: BLUE CROSS/BLUE SHIELD | Admitting: Psychology

## 2018-06-16 ENCOUNTER — Other Ambulatory Visit: Payer: Self-pay | Admitting: Family Medicine

## 2018-07-27 ENCOUNTER — Ambulatory Visit (INDEPENDENT_AMBULATORY_CARE_PROVIDER_SITE_OTHER): Payer: BLUE CROSS/BLUE SHIELD | Admitting: Psychology

## 2018-07-27 DIAGNOSIS — F4323 Adjustment disorder with mixed anxiety and depressed mood: Secondary | ICD-10-CM | POA: Diagnosis not present

## 2018-07-31 ENCOUNTER — Encounter: Payer: Self-pay | Admitting: Family Medicine

## 2018-07-31 ENCOUNTER — Other Ambulatory Visit: Payer: Self-pay | Admitting: Family Medicine

## 2018-07-31 MED ORDER — VENLAFAXINE HCL ER 150 MG PO CP24: 150.0000 mg | ORAL_CAPSULE | Freq: Every day | ORAL | 3 refills | Status: DC

## 2018-08-02 ENCOUNTER — Encounter: Payer: Self-pay | Admitting: Family Medicine

## 2018-08-03 ENCOUNTER — Other Ambulatory Visit: Payer: Self-pay

## 2018-08-03 ENCOUNTER — Other Ambulatory Visit: Payer: Self-pay | Admitting: Family Medicine

## 2018-08-03 ENCOUNTER — Encounter: Payer: Self-pay | Admitting: Family Medicine

## 2018-08-03 ENCOUNTER — Ambulatory Visit (INDEPENDENT_AMBULATORY_CARE_PROVIDER_SITE_OTHER): Payer: BLUE CROSS/BLUE SHIELD | Admitting: Family Medicine

## 2018-08-03 DIAGNOSIS — R300 Dysuria: Secondary | ICD-10-CM | POA: Diagnosis not present

## 2018-08-03 MED ORDER — CIPROFLOXACIN HCL 250 MG PO TABS: 250.0000 mg | ORAL_TABLET | Freq: Two times a day (BID) | ORAL | 0 refills | Status: DC

## 2018-08-03 NOTE — Progress Notes (Signed)
Virtual Visit via Video   I connected with Tricia Munoz on 08/03/18 at 11:00 AM EDT by a video enabled telemedicine application and verified that I am speaking with the correct person using two identifiers. Location patient: Home Location provider: [facility], Office Persons participating in the virtual visit: Filbert Schilderrenia B Gianino, Karaline Buresh, MD   I discussed the limitations of evaluation and management by telemedicine and the availability of in person appointments. The patient expressed understanding and agreed to proceed.  Subjective:   HPI:  Dysuria-started having symptoms for about a week.  Increased urinary frequency, dysuria, some nausea. No fever, no back pain.     Of note, last urine cx was from 11/25/17- reviewed. Great >100,000 E Coli which was pan sensitive.  She was treated with cipro 2250 mg twice daily x 5 days at that time and tolerated it well.  Review of Systems  Constitutional: Negative.   Gastrointestinal: Positive for nausea.  Genitourinary: Positive for dysuria and frequency. Negative for enuresis, flank pain, genital sores, hematuria, menstrual problem and urgency.  Musculoskeletal: Negative.   Neurological: Negative.   All other systems reviewed and are negative.   ROS: See pertinent positives and negatives per HPI.  Patient Active Problem List   Diagnosis Date Noted  . Dysuria 08/03/2018  . Skin lesion 05/25/2018  . Word finding problem 12/11/2017  . Adult ADHD (attention deficit hyperactivity disorder) 12/11/2017  . 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 Transient hypersomnia 12/11/2017  . Memory difficulties 10/08/2017  . Head injury 08/04/2017  . Post concussive syndrome 08/04/2017  . Anxiety 06/09/2017  . Mild persistent asthma 12/03/2016  . Perennial and seasonal allergic rhinitis 10/04/2015  . Anemia 07/18/2015  . Adjustment disorder with mixed anxiety and depressed mood 03/21/2015  . HLD (hyperlipidemia) 07/05/2014  . Depression  12/09/2013  . HTN (hypertension) 03/18/2012  . Osteoarthritis 02/17/2009  . POLYARTHRITIS 02/17/2009  . OSTEOPENIA 02/17/2009    Social History   Tobacco Use  . Smoking status: Never Smoker  . Smokeless tobacco: Never Used  Substance Use Topics  . Alcohol use: No    Current Outpatient Medications:  .  albuterol (PROVENTIL HFA;VENTOLIN HFA) 108 (90 Base) MCG/ACT inhaler, Inhale 2 puffs into the lungs every 6 (six) hours as needed for wheezing or shortness of breath., Disp: 1 Inhaler, Rfl: 0 .  amphetamine-dextroamphetamine (ADDERALL XR) 30 MG 24 hr capsule, Take 1 capsule (30 mg total) by mouth every morning., Disp: 30 capsule, Rfl: 0 .  azelastine (OPTIVAR) 0.05 % ophthalmic solution, Apply 1 drop to eye 2 (two) times daily., Disp: 6 mL, Rfl: 12 .  Azelastine-Fluticasone 137-50 MCG/ACT SUSP, 2 spray in each nostril daily, Disp: 1 Bottle, Rfl: 3 .  benazepril (LOTENSIN) 20 MG tablet, Take 0.5 tablets (10 mg total) by mouth daily., Disp: 90 tablet, Rfl: 3 .  Cholecalciferol (VITAMIN D) 2000 units CAPS, Take by mouth., Disp: , Rfl:  .  clotrimazole-betamethasone (LOTRISONE) cream, Apply 1 application topically 2 (two) times daily., Disp: 30 g, Rfl: 0 .  Cyanocobalamin (VITAMIN B 12 PO), Take 3,000 mcg by mouth., Disp: , Rfl:  .  hydrochlorothiazide (HYDRODIURIL) 25 MG tablet, Take 0.5 tablets (12.5 mg total) by mouth daily., Disp: 90 tablet, Rfl: 3 .  levocetirizine (XYZAL) 5 MG tablet, TAKE ONE TABLET BY MOUTH EACH EVENING, Disp: 30 tablet, Rfl: 11 .  Multiple Vitamins-Minerals (WOMENS MULTIVITAMIN PO), Take by mouth., Disp: , Rfl:  .  simvastatin (ZOCOR) 10 MG tablet, TAKE 1 TABLET BY MOUTH  EVERYDAY AT BEDTIME, Disp: 30 tablet, Rfl: 1 .  triamcinolone cream (KENALOG) 0.1 %, Apply a thin layer to affected areas twice a day until improved., Disp: 453.6 g, Rfl: 2 .  venlafaxine XR (EFFEXOR XR) 150 MG 24 hr capsule, Take 1 capsule (150 mg total) by mouth daily with breakfast., Disp: 90  capsule, Rfl: 3 .  vitamin C (ASCORBIC ACID) 250 MG tablet, Take 250 mg by mouth daily., Disp: , Rfl:  .  ciprofloxacin (CIPRO) 250 MG tablet, Take 1 tablet (250 mg total) by mouth 2 (two) times daily., Disp: 10 tablet, Rfl: 0  Allergies  Allergen Reactions  . Evekeo [Amphetamine Sulfate]   . Montelukast     itch  . Singulair [Montelukast Sodium] Rash    Objective:  LMP 11/06/2012   VITALS: Per patient if applicable, see vitals. GENERAL: Alert, appears well and in no acute distress. HEENT: Atraumatic, conjunctiva clear, no obvious abnormalities on inspection of external nose and ears. NECK: Normal movements of the head and neck. CARDIOPULMONARY: No increased WOB. Speaking in clear sentences. I:E ratio WNL.  MS: Moves all visible extremities without noticeable abnormality. No flank pain when she palpates her back on either side Abd:  + suprapubic tenderness when she presses on her abdomen on exam PSYCH: Pleasant and cooperative, well-groomed. Speech normal rate and rhythm. Affect is appropriate. Insight and judgement are appropriate. Attention is focused, linear, and appropriate.  NEURO: CN grossly intact. Oriented as arrived to appointment on time with no prompting. Moves both UE equally.  SKIN: No obvious lesions, wounds, erythema, or cyanosis noted on face or hands.  Assessment and Plan:   Kana was seen today for urinary tract infection.  Diagnoses and all orders for this visit:  Dysuria  Other orders -     ciprofloxacin (CIPRO) 250 MG tablet; Take 1 tablet (250 mg total) by mouth 2 (two) times daily.    . Reviewed expectations re: course of current medical issues. . Discussed self-management of symptoms. . Outlined signs and symptoms indicating need for more acute intervention. . Patient verbalized understanding and all questions were answered. Marland Kitchen Health Maintenance issues including appropriate healthy diet, exercise, and smoking avoidance were discussed with patient.  . See orders for this visit as documented in the electronic medical record.  Ruthe Mannan, MD 08/03/2018

## 2018-08-03 NOTE — Assessment & Plan Note (Signed)
Consistent with previous symptoms of UTI- classic symptoms of uncomplicated cystitis. Responded well previously to cipro. eRx sent for cipro 250 mg twice daily x 5 days, push fluids. Call or send my chart message prn if these symptoms worsen or fail to improve as anticipated. The patient indicates understanding of these issues and agrees with the plan.

## 2018-08-04 ENCOUNTER — Encounter: Payer: Self-pay | Admitting: Family Medicine

## 2018-08-04 DIAGNOSIS — Z1212 Encounter for screening for malignant neoplasm of rectum: Secondary | ICD-10-CM | POA: Diagnosis not present

## 2018-08-04 DIAGNOSIS — Z1211 Encounter for screening for malignant neoplasm of colon: Secondary | ICD-10-CM | POA: Diagnosis not present

## 2018-08-04 LAB — COLOGUARD

## 2018-08-06 ENCOUNTER — Ambulatory Visit (INDEPENDENT_AMBULATORY_CARE_PROVIDER_SITE_OTHER): Payer: BLUE CROSS/BLUE SHIELD | Admitting: Psychology

## 2018-08-06 DIAGNOSIS — F4323 Adjustment disorder with mixed anxiety and depressed mood: Secondary | ICD-10-CM

## 2018-08-10 ENCOUNTER — Encounter: Payer: Self-pay | Admitting: Family Medicine

## 2018-08-11 ENCOUNTER — Other Ambulatory Visit: Payer: Self-pay | Admitting: Family Medicine

## 2018-08-11 MED ORDER — SIMVASTATIN 10 MG PO TABS: ORAL_TABLET | ORAL | 1 refills | Status: DC

## 2018-08-12 ENCOUNTER — Ambulatory Visit (INDEPENDENT_AMBULATORY_CARE_PROVIDER_SITE_OTHER): Payer: BLUE CROSS/BLUE SHIELD | Admitting: Psychology

## 2018-08-12 DIAGNOSIS — F4323 Adjustment disorder with mixed anxiety and depressed mood: Secondary | ICD-10-CM

## 2018-08-21 ENCOUNTER — Ambulatory Visit (INDEPENDENT_AMBULATORY_CARE_PROVIDER_SITE_OTHER): Payer: BLUE CROSS/BLUE SHIELD | Admitting: Psychology

## 2018-08-21 DIAGNOSIS — F4323 Adjustment disorder with mixed anxiety and depressed mood: Secondary | ICD-10-CM

## 2018-08-26 ENCOUNTER — Ambulatory Visit (INDEPENDENT_AMBULATORY_CARE_PROVIDER_SITE_OTHER): Payer: BLUE CROSS/BLUE SHIELD | Admitting: Psychology

## 2018-08-26 DIAGNOSIS — F4323 Adjustment disorder with mixed anxiety and depressed mood: Secondary | ICD-10-CM | POA: Diagnosis not present

## 2018-09-09 ENCOUNTER — Ambulatory Visit (INDEPENDENT_AMBULATORY_CARE_PROVIDER_SITE_OTHER): Payer: BLUE CROSS/BLUE SHIELD | Admitting: Psychology

## 2018-09-09 DIAGNOSIS — F4323 Adjustment disorder with mixed anxiety and depressed mood: Secondary | ICD-10-CM | POA: Diagnosis not present

## 2018-09-14 ENCOUNTER — Encounter: Payer: Self-pay | Admitting: Family Medicine

## 2018-09-15 ENCOUNTER — Ambulatory Visit (INDEPENDENT_AMBULATORY_CARE_PROVIDER_SITE_OTHER): Payer: BC Managed Care – PPO | Admitting: Psychology

## 2018-09-15 DIAGNOSIS — F4323 Adjustment disorder with mixed anxiety and depressed mood: Secondary | ICD-10-CM

## 2018-09-23 ENCOUNTER — Ambulatory Visit (INDEPENDENT_AMBULATORY_CARE_PROVIDER_SITE_OTHER): Payer: BC Managed Care – PPO | Admitting: Psychology

## 2018-09-23 DIAGNOSIS — F4323 Adjustment disorder with mixed anxiety and depressed mood: Secondary | ICD-10-CM

## 2018-09-30 ENCOUNTER — Ambulatory Visit: Payer: BLUE CROSS/BLUE SHIELD | Admitting: Psychology

## 2018-10-13 ENCOUNTER — Ambulatory Visit (INDEPENDENT_AMBULATORY_CARE_PROVIDER_SITE_OTHER): Payer: BC Managed Care – PPO | Admitting: Psychology

## 2018-10-13 DIAGNOSIS — F4323 Adjustment disorder with mixed anxiety and depressed mood: Secondary | ICD-10-CM

## 2018-10-26 ENCOUNTER — Ambulatory Visit (INDEPENDENT_AMBULATORY_CARE_PROVIDER_SITE_OTHER): Payer: BC Managed Care – PPO | Admitting: Psychology

## 2018-10-26 DIAGNOSIS — F4323 Adjustment disorder with mixed anxiety and depressed mood: Secondary | ICD-10-CM

## 2018-11-10 ENCOUNTER — Ambulatory Visit (INDEPENDENT_AMBULATORY_CARE_PROVIDER_SITE_OTHER): Payer: BC Managed Care – PPO | Admitting: Psychology

## 2018-11-10 DIAGNOSIS — F4323 Adjustment disorder with mixed anxiety and depressed mood: Secondary | ICD-10-CM

## 2018-11-24 ENCOUNTER — Ambulatory Visit: Payer: BC Managed Care – PPO | Admitting: Psychology

## 2018-12-08 ENCOUNTER — Ambulatory Visit (INDEPENDENT_AMBULATORY_CARE_PROVIDER_SITE_OTHER): Payer: BC Managed Care – PPO | Admitting: Psychology

## 2018-12-08 DIAGNOSIS — F4323 Adjustment disorder with mixed anxiety and depressed mood: Secondary | ICD-10-CM | POA: Diagnosis not present

## 2018-12-30 ENCOUNTER — Ambulatory Visit (INDEPENDENT_AMBULATORY_CARE_PROVIDER_SITE_OTHER): Payer: BC Managed Care – PPO | Admitting: Psychology

## 2018-12-30 DIAGNOSIS — F4323 Adjustment disorder with mixed anxiety and depressed mood: Secondary | ICD-10-CM

## 2019-01-03 IMAGING — MG DIGITAL SCREENING BILATERAL MAMMOGRAM WITH TOMO AND CAD
8 series · 9 of 24 positions shown · non-contrast
Comparison: Previous exam(s).

CLINICAL DATA: Screening.

EXAM:
DIGITAL SCREENING BILATERAL MAMMOGRAM WITH TOMO AND CAD

[L CC synth-2D]
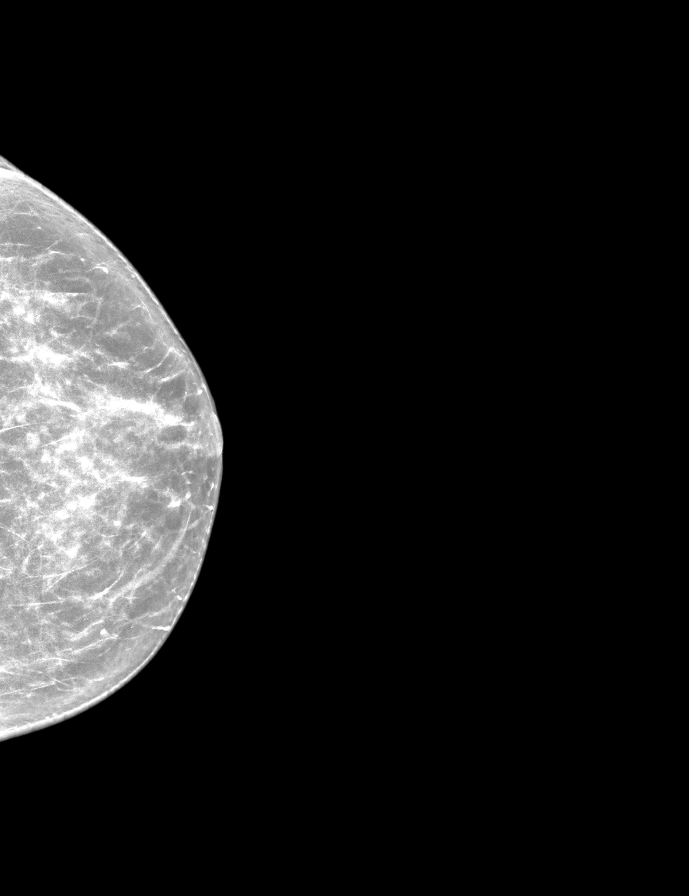

[R CC synth-2D]
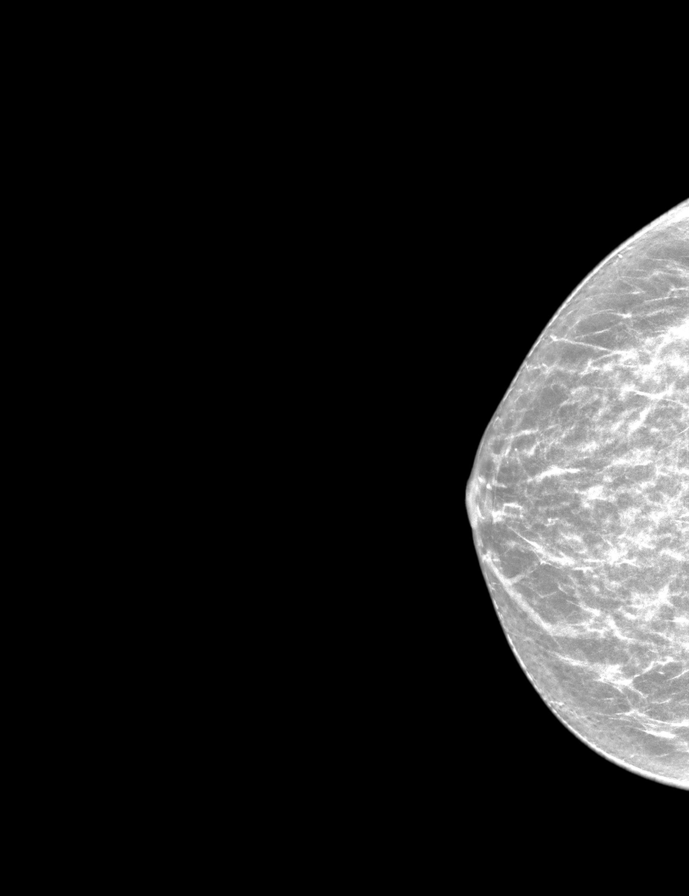

[R MLO synth-2D]
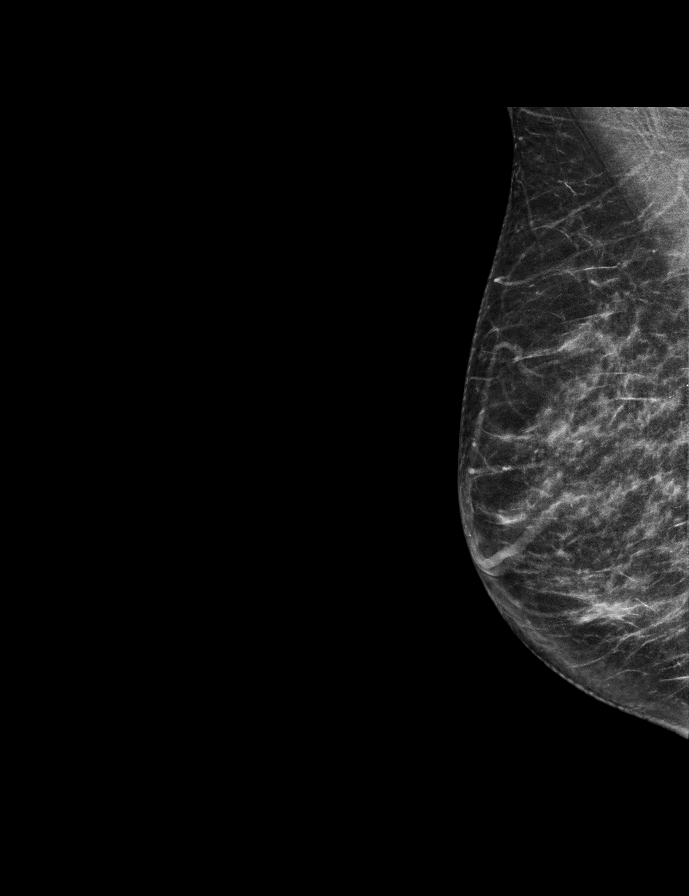

[L MLO synth-2D]
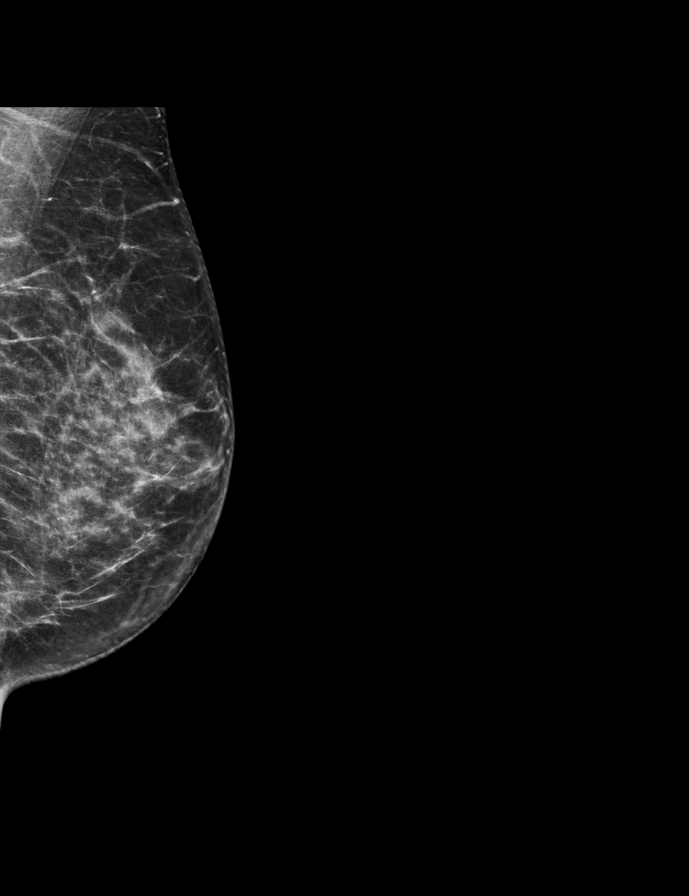

[L MLO tomo · 2 of 61 frames shown]
[frame 20/61]
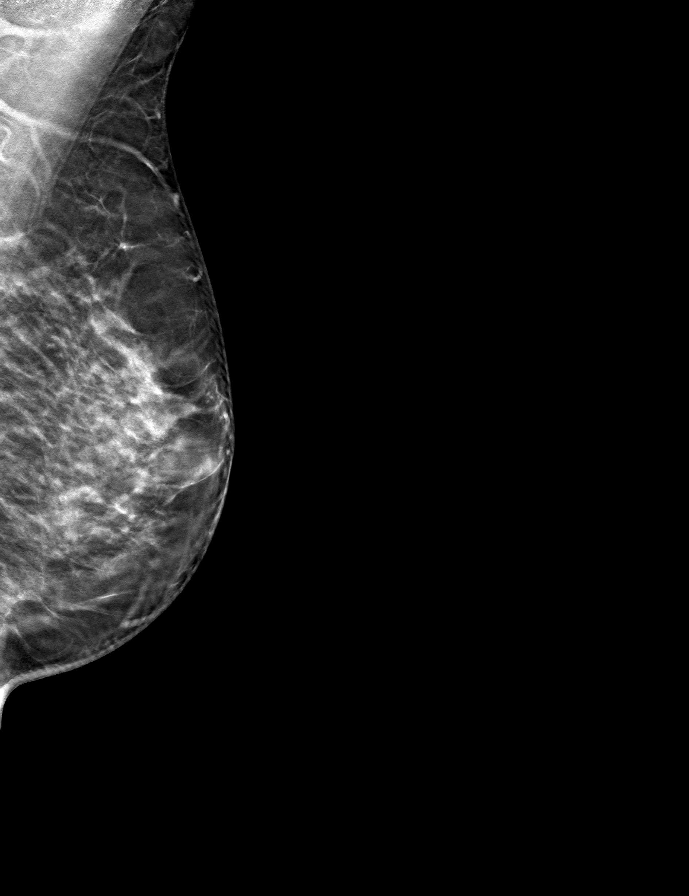
[frame 31/61]
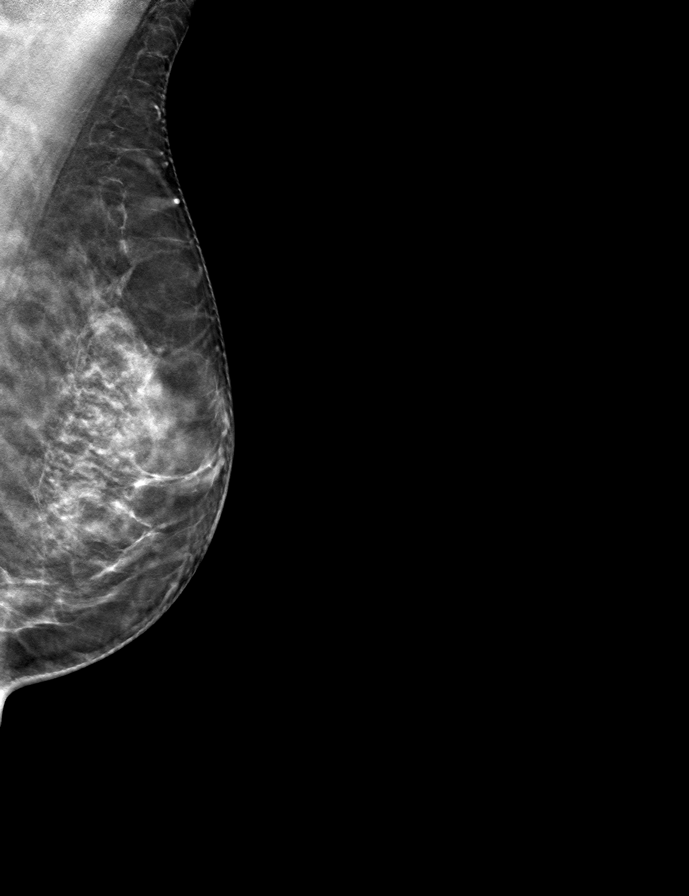

[R CC tomo · tomo slice 27/52.0]
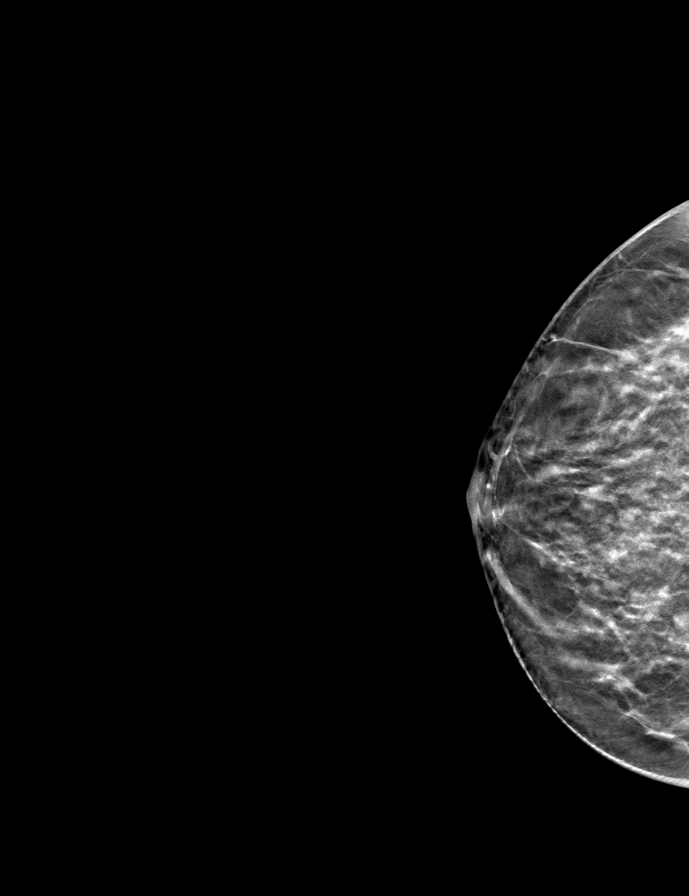

[R MLO tomo · tomo slice 30/59.0]
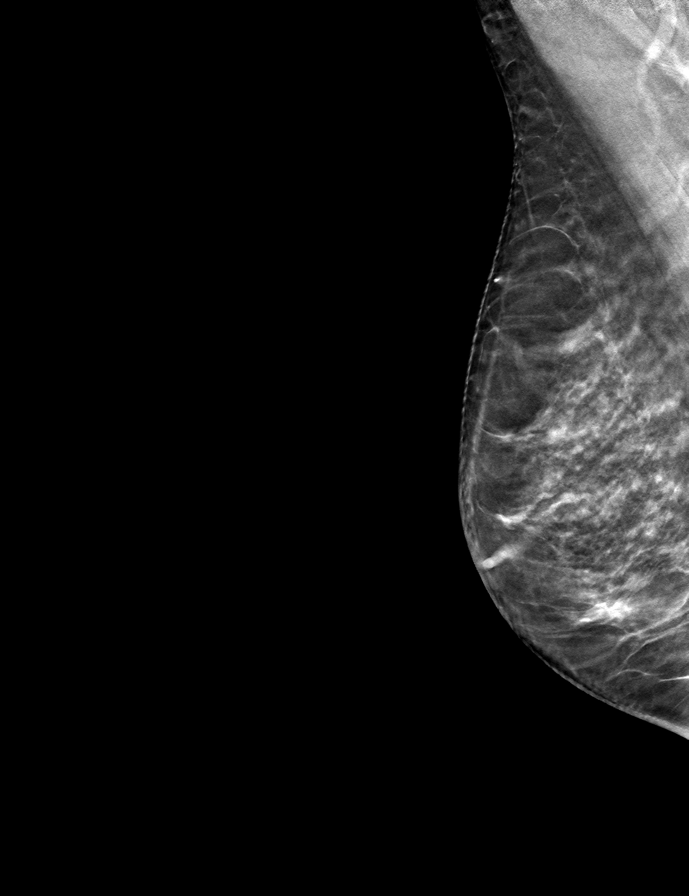

[L CC tomo · tomo slice 29/57.0]
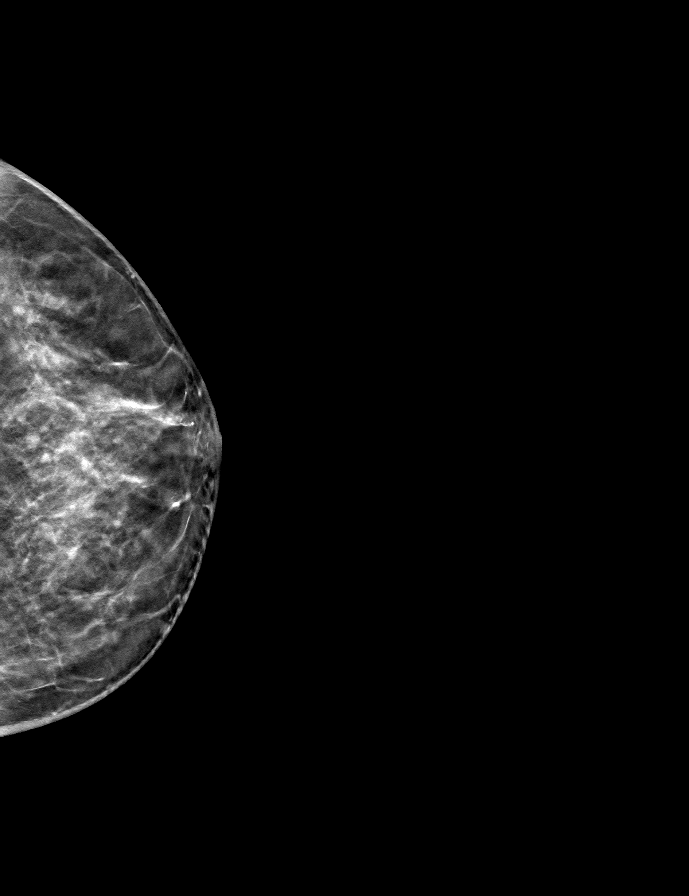

[9 of 24 positions shown; findings below may reference images not displayed]

ACR Breast Density Category c: The breast tissue is heterogeneously
dense, which may obscure small masses.
FINDINGS: There are no findings suspicious for malignancy. Images were
processed with CAD.
IMPRESSION: No mammographic evidence of malignancy. A result letter of this
screening mammogram will be mailed directly to the patient.

RECOMMENDATION:
Screening mammogram in one year. (Code:FT-U-LHB)

BI-RADS CATEGORY  1: Negative.

## 2019-01-11 ENCOUNTER — Encounter: Payer: Self-pay | Admitting: Family Medicine

## 2019-01-11 MED ORDER — SIMVASTATIN 10 MG PO TABS: ORAL_TABLET | ORAL | 1 refills | Status: DC

## 2019-01-13 ENCOUNTER — Ambulatory Visit (INDEPENDENT_AMBULATORY_CARE_PROVIDER_SITE_OTHER): Payer: BC Managed Care – PPO | Admitting: Psychology

## 2019-01-13 DIAGNOSIS — F4323 Adjustment disorder with mixed anxiety and depressed mood: Secondary | ICD-10-CM

## 2019-01-14 ENCOUNTER — Telehealth: Payer: Self-pay

## 2019-01-14 NOTE — Telephone Encounter (Signed)
Is she talking about adderall?  I am not sure which medication she is referring to.  I wouldn't take adderall if she is not feeling well.

## 2019-01-14 NOTE — Telephone Encounter (Signed)
Copied from [place] [id number]. Topic: General - Other >> Jan 14, 2019 11:42 AM Antonieta Iba C wrote: Reason for CRM: pt called in to be advised, pt says that she forgot to take her medications yesterday. Pt took them today and felt nauseous. Pt says that she vomited medication up, she didn't look to see if the medicine was in her emesis. Pt would like to be advised further on if she should retake medication considering that she threw them up?  CB: [phone]

## 2019-01-14 NOTE — Telephone Encounter (Signed)
I spoke with pt. She took her medicines like she normally would this morning & not even 30 minutes later, she threw up. She's unsure if she threw up the medications or not. I told her not to take them until she hears back from [place]. She is feeling dizzy still, but no fever and hasn't thrown up since.

## 2019-01-26 ENCOUNTER — Ambulatory Visit (INDEPENDENT_AMBULATORY_CARE_PROVIDER_SITE_OTHER): Payer: BC Managed Care – PPO | Admitting: Psychology

## 2019-01-26 DIAGNOSIS — F4323 Adjustment disorder with mixed anxiety and depressed mood: Secondary | ICD-10-CM

## 2019-02-16 ENCOUNTER — Ambulatory Visit (INDEPENDENT_AMBULATORY_CARE_PROVIDER_SITE_OTHER): Payer: BC Managed Care – PPO | Admitting: Psychology

## 2019-02-16 DIAGNOSIS — F4323 Adjustment disorder with mixed anxiety and depressed mood: Secondary | ICD-10-CM | POA: Diagnosis not present

## 2019-03-03 ENCOUNTER — Ambulatory Visit: Payer: BC Managed Care – PPO | Admitting: Psychology

## 2019-03-03 ENCOUNTER — Telehealth: Payer: Self-pay

## 2019-03-03 NOTE — Telephone Encounter (Signed)
Copied from [place] [id number]. Topic: General - Other >> Mar 03, 2019  2:20 PM Celene Kras wrote: Reason for CRM: Pt called and is requesting a call back from PCP. Please advise.

## 2019-03-04 NOTE — Telephone Encounter (Signed)
I called pt and she informed me that she wanted to speak to [facility].  I informed pt that Mrs. Clovis Pu is a Dispensing optician.  She said that she will contact that office.

## 2019-03-09 ENCOUNTER — Encounter: Payer: Self-pay | Admitting: Family Medicine

## 2019-03-09 DIAGNOSIS — L298 Other pruritus: Secondary | ICD-10-CM

## 2019-03-10 MED ORDER — SIMVASTATIN 10 MG PO TABS: ORAL_TABLET | ORAL | 1 refills | Status: DC

## 2019-03-10 MED ORDER — LEVOCETIRIZINE DIHYDROCHLORIDE 5 MG PO TABS: ORAL_TABLET | ORAL | 0 refills | Status: DC

## 2019-03-10 NOTE — Telephone Encounter (Signed)
Last OV 08/03/18 Last fill for Levocetirizine 5MG  03/10/18  #30/11 Last fill for Simvastatin 10MG  01/11/19  #30/1

## 2019-03-17 ENCOUNTER — Ambulatory Visit (INDEPENDENT_AMBULATORY_CARE_PROVIDER_SITE_OTHER): Payer: BC Managed Care – PPO | Admitting: Psychology

## 2019-03-17 DIAGNOSIS — F4323 Adjustment disorder with mixed anxiety and depressed mood: Secondary | ICD-10-CM

## 2019-03-31 ENCOUNTER — Ambulatory Visit (INDEPENDENT_AMBULATORY_CARE_PROVIDER_SITE_OTHER): Payer: BC Managed Care – PPO | Admitting: Psychology

## 2019-03-31 DIAGNOSIS — F4323 Adjustment disorder with mixed anxiety and depressed mood: Secondary | ICD-10-CM

## 2019-04-03 ENCOUNTER — Other Ambulatory Visit: Payer: Self-pay | Admitting: Family Medicine

## 2019-04-03 DIAGNOSIS — L298 Other pruritus: Secondary | ICD-10-CM

## 2019-04-05 NOTE — Telephone Encounter (Signed)
Last OV 08/03/18 Last fill 03/10/19  #30/0

## 2019-04-07 ENCOUNTER — Other Ambulatory Visit: Payer: Self-pay

## 2019-04-07 MED ORDER — BENAZEPRIL HCL 20 MG PO TABS: 10.0000 mg | ORAL_TABLET | Freq: Every day | ORAL | 3 refills | Status: DC

## 2019-04-07 MED ORDER — HYDROCHLOROTHIAZIDE 25 MG PO TABS: 12.5000 mg | ORAL_TABLET | Freq: Every day | ORAL | 3 refills | Status: DC

## 2019-04-07 NOTE — Telephone Encounter (Signed)
Last OV 08/03/18 Last fill 01/26/18  #90/3

## 2019-04-28 ENCOUNTER — Ambulatory Visit (INDEPENDENT_AMBULATORY_CARE_PROVIDER_SITE_OTHER): Payer: Self-pay | Admitting: Psychology

## 2019-04-28 ENCOUNTER — Other Ambulatory Visit: Payer: Self-pay | Admitting: Family Medicine

## 2019-04-28 DIAGNOSIS — L298 Other pruritus: Secondary | ICD-10-CM

## 2019-04-28 DIAGNOSIS — F4323 Adjustment disorder with mixed anxiety and depressed mood: Secondary | ICD-10-CM

## 2019-05-04 ENCOUNTER — Other Ambulatory Visit: Payer: Self-pay | Admitting: Family Medicine

## 2019-05-10 ENCOUNTER — Encounter: Payer: Self-pay | Admitting: Family Medicine

## 2019-05-10 ENCOUNTER — Telehealth (INDEPENDENT_AMBULATORY_CARE_PROVIDER_SITE_OTHER): Payer: 59 | Admitting: Family Medicine

## 2019-05-10 VITALS — Ht 62.0 in | Wt 115.0 lb

## 2019-05-10 DIAGNOSIS — H02843 Edema of right eye, unspecified eyelid: Secondary | ICD-10-CM | POA: Insufficient documentation

## 2019-05-10 DIAGNOSIS — H02846 Edema of left eye, unspecified eyelid: Secondary | ICD-10-CM | POA: Diagnosis not present

## 2019-05-10 NOTE — Assessment & Plan Note (Signed)
Seems to be related to allergies given itching and improvement with benadryl.  Declines steroid as this makes to hyperactivity worse.  She will continue to monitor and schedule f/u with her allergist.

## 2019-05-10 NOTE — Progress Notes (Signed)
Tricia Munoz - 59 y.o. female MRN [id number]  Date of birth: October 31, 1960   This visit type was conducted due to national recommendations for restrictions regarding the COVID-19 Pandemic (e.g. social distancing).  This format is felt to be most appropriate for this patient at this time.  All issues noted in this document were discussed and addressed.  No physical exam was performed (except for noted visual exam findings with Video Visits).  I discussed the limitations of evaluation and management by telemedicine and the availability of in person appointments. The patient expressed understanding and agreed to proceed.  I connected with@ on 05/10/19 at 10:40 AM EST by a video enabled telemedicine application and verified that I am speaking with the correct person using two identifiers.  Present at visit: Everrett Coombe, DO Merian Capron   Patient Location: Home [address] [place] [address]   Provider location:   Home office  Chief Complaint  Patient presents with  . Eye Pain    Pt c/o eyelids swollen, itchy and redness. X2 day.  Pt does have allergies,  pt is thinking it's sinuses.    HPI  Tricia Munoz is a 59 y.o. female who presents via audio/video conferencing for a telehealth visit today.  She has complaint of eyelid redness, swelling and itching.  Current symptoms started x2 days ago but she has had this a few times over the past 6-8 weeks.  Episodes resolve on their own.  She has had mild sinus congestion with a little ttp under her eyes.  She denies fever, chills, vision changes.  She has history of allergies but denies any new medications, foods, make-up, soaps or lotions.  She has a dog, no changes recently.  She has had AC duct work cleaned and cleans home air filters regularly.  She has an allergist but it has been about a year since she was last seen.  She has been taking benadryl with improvement.  She also takes daily xyzal.    ROS:  A comprehensive ROS  was completed and negative except as noted per HPI  Past Medical History:  Diagnosis Date  . ADHD   . Asthma   . Hypertension   . OA (osteoarthritis)   . Osteopenia     History reviewed. No pertinent surgical history.  Family History  Problem Relation Age of Onset  . Dementia Mother   . Hypertension Father   . Allergic rhinitis Father   . Kidney disease Sister   . Breast cancer Maternal Aunt 57       1/2 sister  . Angioedema Neg Hx   . Asthma Neg Hx   . Eczema Neg Hx   . Immunodeficiency Neg Hx   . Urticaria Neg Hx     Social History   Socioeconomic History  . Marital status: Married    Spouse name: Not on file  . Number of children: 1  . Years of education: Not on file  . Highest education level: Not on file  Occupational History  . Occupation: self employeed  Tobacco Use  . Smoking status: Never Smoker  . Smokeless tobacco: Never Used  Substance and Sexual Activity  . Alcohol use: No  . Drug use: No  . Sexual activity: Not on file  Other Topics Concern  . Not on file  Social History Narrative   Lives in [place] with husband. 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 Difficulty of Paying Living Expenses: Not on file  Food Insecurity:   . Worried About Programme researcher, broadcasting/film/video in the Last Year: Not on file  . Ran Out of Food in the Last Year: Not on file  Transportation Needs:   . Lack of Transportation (Medical): Not on file  . Lack of Transportation (Non-Medical): Not on file  Physical Activity:   . Days of Exercise per Week: Not on file  . Minutes of Exercise per Session: Not on file  Stress:   . Feeling of Stress : Not on file  Social Connections:   . Frequency of Communication with Friends and Family: Not on file  . Frequency of Social Gatherings with Friends and Family: Not on file  . Attends Religious Services: Not on  file  . Active Member of Clubs or Organizations: Not on file  . Attends Banker Meetings: Not on file  . Marital Status: Not on file  Intimate Partner Violence:   . Fear of Current or Ex-Partner: Not on file  . Emotionally Abused: Not on file  . Physically Abused: Not on file  . Sexually Abused: Not on file     Current Outpatient Medications:  .  albuterol (PROVENTIL HFA;VENTOLIN HFA) 108 (90 Base) MCG/ACT inhaler, Inhale 2 puffs into the lungs every 6 (six) hours as needed for wheezing or shortness of breath., Disp: 1 Inhaler, Rfl: 0 .  azelastine (OPTIVAR) 0.05 % ophthalmic solution, Apply 1 drop to eye 2 (two) times daily., Disp: 6 mL, Rfl: 12 .  Azelastine-Fluticasone 137-50 MCG/ACT SUSP, 2 spray in each nostril daily, Disp: 1 Bottle, Rfl: 3 .  benazepril (LOTENSIN) 20 MG tablet, Take 0.5 tablets (10 mg total) by mouth daily., Disp: 90 tablet, Rfl: 3 .  Cholecalciferol (VITAMIN D) 2000 units CAPS, Take by mouth., Disp: , Rfl:  .  clotrimazole-betamethasone (LOTRISONE) cream, APPLY TO AFFECTED AREA TWICE A DAY, Disp: 30 g, Rfl: 0 .  Cyanocobalamin (VITAMIN B 12 PO), Take 3,000 mcg by mouth., Disp: , Rfl:  .  hydrochlorothiazide (HYDRODIURIL) 25 MG tablet, Take 0.5 tablets (12.5 mg total) by mouth daily., Disp: 90 tablet, Rfl: 3 .  levocetirizine (XYZAL) 5 MG tablet, TAKE ONE TABLET BY MOUTH EACH EVENING, Disp: 30 tablet, Rfl: 11 .  Multiple Vitamins-Minerals (WOMENS MULTIVITAMIN PO), Take by mouth., Disp: , Rfl:  .  simvastatin (ZOCOR) 10 MG tablet, TAKE 1 TABLET BY MOUTH EVERYDAY AT BEDTIME, Disp: 90 tablet, Rfl: 1 .  triamcinolone cream (KENALOG) 0.1 %, Apply a thin layer to affected areas twice a day until improved., Disp: 453.6 g, Rfl: 2 .  venlafaxine XR (EFFEXOR XR) 150 MG 24 hr capsule, Take 1 capsule (150 mg total) by mouth daily with breakfast., Disp: 90 capsule, Rfl: 3 .  vitamin C (ASCORBIC ACID) 250 MG tablet, Take 250 mg by mouth daily., Disp: , Rfl:    EXAM:  VITALS per patient if applicable: Ht 5\' 2"  (1.575 m)   Wt 115 lb (52.2 kg)   LMP 11/06/2012   BMI 21.03 kg/m   GENERAL: alert, oriented, appears well and in no acute distress  HEENT: atraumatic, conjunttiva clear, no obvious abnormalities on inspection of external nose and ears  NECK: normal movements of the head and neck  LUNGS: on inspection no signs of respiratory distress, breathing rate appears normal, no obvious gross SOB, gasping or wheezing  CV: no obvious  cyanosis  MS: moves all visible extremities without noticeable abnormality  PSYCH/NEURO: pleasant and cooperative, no obvious depression or anxiety, speech and thought processing grossly intact  ASSESSMENT AND PLAN:  Discussed the following assessment and plan:  Edema of eyelid of both eyes Seems to be related to allergies given itching and improvement with benadryl.  Declines steroid as this makes to hyperactivity worse.  She will continue to monitor and schedule f/u with her allergist.      I discussed the assessment and treatment plan with the patient. The patient was provided an opportunity to ask questions and all were answered. The patient agreed with the plan and demonstrated an understanding of the instructions.   The patient was advised to call back or seek an in-person evaluation if the symptoms worsen or if the condition fails to improve as anticipated.    Luetta Nutting, DO

## 2019-05-11 ENCOUNTER — Other Ambulatory Visit: Payer: Self-pay

## 2019-05-11 ENCOUNTER — Encounter: Payer: Self-pay | Admitting: Allergy & Immunology

## 2019-05-11 ENCOUNTER — Ambulatory Visit: Payer: 59 | Admitting: Allergy & Immunology

## 2019-05-11 VITALS — BP 138/90 | HR 88 | Temp 98.2°F | Resp 16 | Ht 62.0 in | Wt 119.2 lb

## 2019-05-11 DIAGNOSIS — J4530 Mild persistent asthma, uncomplicated: Secondary | ICD-10-CM | POA: Diagnosis not present

## 2019-05-11 DIAGNOSIS — L232 Allergic contact dermatitis due to cosmetics: Secondary | ICD-10-CM | POA: Diagnosis not present

## 2019-05-11 DIAGNOSIS — J3089 Other allergic rhinitis: Secondary | ICD-10-CM

## 2019-05-11 MED ORDER — METHYLPREDNISOLONE ACETATE 40 MG/ML IJ SUSP
40.0000 mg | Freq: Once | INTRAMUSCULAR | Status: AC
  Administered 2019-05-11: 17:00:00 40 mg via INTRAMUSCULAR

## 2019-05-11 MED ORDER — EUCRISA 2 % EX OINT: 1.0000 "application " | TOPICAL_OINTMENT | Freq: Two times a day (BID) | CUTANEOUS | 5 refills | Status: DC

## 2019-05-11 NOTE — Progress Notes (Signed)
FOLLOW UP  Date of Service/Encounter:  05/11/19   Assessment:   Allergic contact dermatitis due to cosmetics  Mild persistent asthma without complication  Perennial and seasonal allergic rhinitis  Plan/Recommendations:   1. Contact dermatitis - DepoMedrol 40mg  given today. - We will do patch testing in two weeks. - Consider bringing in some of your cosmetics for patch testing as well. - Add on Eucrisa twice daily to the skin around the eyes twice daily as needed.  - Place in the fridge to decrease the burning.  3. Return in about 2 weeks (around 05/25/2019). This can be an in-person, a virtual Webex or a telephone follow up visit.   Subjective:   Tricia Munoz is a 59 y.o. female presenting today for follow up of  Chief Complaint  Patient presents with  . Other    eye swelling, this has happened 3 x in the last 6 weeks  . Urticaria    on both hands, rash, blisters sometimes    Tricia Munoz has a history of the following: Patient Active Problem List   Diagnosis Date Noted  . Edema of eyelid of both eyes 05/10/2019  . Dysuria 08/03/2018  . Skin lesion 05/25/2018  . Word finding problem 12/11/2017  . Adult ADHD (attention deficit hyperactivity disorder) 12/11/2017  . 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 Transient hypersomnia 12/11/2017  . Memory difficulties 10/08/2017  . Head injury 08/04/2017  . Post concussive syndrome 08/04/2017  . Anxiety 06/09/2017  . Mild persistent asthma 12/03/2016  . Perennial and seasonal allergic rhinitis 10/04/2015  . Anemia 07/18/2015  . Adjustment disorder with mixed anxiety and depressed mood 03/21/2015  . HLD (hyperlipidemia) 07/05/2014  . Depression 12/09/2013  . HTN (hypertension) 03/18/2012  . Osteoarthritis 02/17/2009  . POLYARTHRITIS 02/17/2009  . OSTEOPENIA 02/17/2009    History obtained from: chart review and patient.  Tricia Munoz is a 59 y.o. female presenting for a follow up visit.  She was last seen in  September 2018 by Dr. October 2018.  At that time, she was evaluated for a rash on her chest.  She recommended taking Xyzal twice daily as well as triamcinolone twice daily to the affected area.  She was also given hydroxyzine 25 mg for breakthrough symptoms.  Since last visit, she has mostly done well.  However, she has had 3 distinct episodes of periorbital erythema and swelling.  Her most recent episode started on Saturday and has worsened daily since then.  She has used a cold rag to try to calm it down.  She has used some Lubriderm as well, which tends to sting.  She does not have any medicated ointment to use on this at all.  She is unsure of the trigger.  All of the other episodes have been very similar to this.  She does wear make-up, but she has been using the same make-up for years apparently.  She does not think it is her make-up at all.  She does have a dog at home but she was negative to testing.  There is otherwise been no new exposures in her life.  She would like to figure out what is going on and treat this acute exacerbation.  She has never had any other similar rashes.  She denies any history of metal allergy or other chemical sensitivity.  Otherwise, there have been no changes to her past medical history, surgical history, family history, or social history.    Review of Systems  Constitutional: Negative.  Negative for  chills, fever, malaise/fatigue and weight loss.  HENT: Negative.  Negative for congestion, ear discharge and ear pain.   Eyes: Negative for pain, discharge and redness.  Respiratory: Negative for cough, sputum production, shortness of breath and wheezing.   Cardiovascular: Negative.  Negative for chest pain and palpitations.  Gastrointestinal: Negative for abdominal pain, constipation, diarrhea, heartburn, nausea and vomiting.  Skin: Positive for itching and rash.  Neurological: Negative for dizziness and headaches.  Endo/Heme/Allergies: Negative for environmental  allergies. Does not bruise/bleed easily.       Objective:   Blood pressure 138/90, pulse 88, temperature 98.2 F (36.8 C), temperature source Temporal, resp. rate 16, height 5\' 2"  (1.575 m), weight 119 lb 3.2 oz (54.1 kg), last menstrual period 11/06/2012, SpO2 96 %. Body mass index is 21.8 kg/m.   Physical Exam:  Physical Exam  Constitutional: She appears well-developed.  HENT:  Head: Normocephalic and atraumatic.  Right Ear: Tympanic membrane, external ear and ear canal normal.  Left Ear: Tympanic membrane and ear canal normal.  Nose: No mucosal edema, rhinorrhea, nasal deformity or septal deviation. No epistaxis. Right sinus exhibits no maxillary sinus tenderness and no frontal sinus tenderness. Left sinus exhibits no maxillary sinus tenderness and no frontal sinus tenderness.  Mouth/Throat: Uvula is midline and oropharynx is clear and moist. Mucous membranes are not pale and not dry.  Eyes: Pupils are equal, round, and reactive to light. Conjunctivae and EOM are normal. Right eye exhibits no chemosis and no discharge. Left eye exhibits no chemosis and no discharge. Right conjunctiva is not injected. Left conjunctiva is not injected.  Cardiovascular: Normal rate, regular rhythm and normal heart sounds.  Respiratory: Effort normal and breath sounds normal. No accessory muscle usage. No tachypnea. No respiratory distress. She has no wheezes. She has no rhonchi. She has no rales. She exhibits no tenderness.  No wheezing or crackles.  Lymphadenopathy:    She has no cervical adenopathy.  Neurological: She is alert.  Skin: No abrasion, no petechiae and no rash noted. Rash is not papular, not vesicular and not urticarial. No erythema. No pallor.  Periorbital bright red erythema extending approximately 2 inches around each eye.  There are some excoriation marks present.  There is also some mild edema.  She does show me some pictures that shows some of the similar rash around her mouth,  although it is certainly not to the same extent.  There is no oozing or honey crusting.  Psychiatric: She has a normal mood and affect.     Diagnostic studies: none     Salvatore Marvel, MD  [facility]

## 2019-05-11 NOTE — Patient Instructions (Addendum)
1. Contact dermatitis - DepoMedrol 40mg  given today. - We will do patch testing in two weeks. - Consider bringing in some of your cosmetics for patch testing as well. - Add on Eucrisa twice daily to the skin around the eyes twice daily as needed.  - Place in the fridge to decrease the burning.  3. Return in about 2 weeks (around 05/25/2019). This can be an in-person, a virtual Webex or a telephone follow up visit.   Please inform 07/23/2019 of any Emergency Department visits, hospitalizations, or changes in symptoms. Call us before going to the ED for breathing or allergy symptoms since we might be able to fit you in for a sick visit. Feel free to contact us anytime with any questions, problems, or concerns.  It was a pleasure to meet you today!  Websites that have reliable patient information: 1. American Academy of Asthma, Allergy, and Immunology: www.aaaai.org 2. Food Allergy Research and Education (FARE): foodallergy.org 3. Mothers of Asthmatics: http://www.asthmacommunitynetwork.org 4. American College of Allergy, Asthma, and Immunology: www.acaai.org   COVID-19 Vaccine Information can be found at: [place] For questions related to vaccine distribution or appointments, please email [email or web address] or call 5026666496.     "Like" [phone] on Facebook and Instagram for our latest updates!        Make sure you are registered to vote! If you have moved or changed any of your contact information, you will need to get this updated before voting!  In some cases, you MAY be able to register to vote online: [place]     True Test looks for the following sensitivities:

## 2019-05-12 ENCOUNTER — Telehealth: Payer: Self-pay

## 2019-05-12 NOTE — Telephone Encounter (Signed)
Prior auth for Tricia Munoz has been approved and sent to the pharmacy.

## 2019-05-24 ENCOUNTER — Encounter: Payer: Self-pay | Admitting: Allergy

## 2019-05-24 ENCOUNTER — Other Ambulatory Visit: Payer: Self-pay

## 2019-05-24 ENCOUNTER — Ambulatory Visit (INDEPENDENT_AMBULATORY_CARE_PROVIDER_SITE_OTHER): Payer: 59 | Admitting: Allergy

## 2019-05-24 VITALS — BP 122/74 | HR 97 | Temp 97.3°F | Resp 18

## 2019-05-24 DIAGNOSIS — L232 Allergic contact dermatitis due to cosmetics: Secondary | ICD-10-CM

## 2019-05-24 NOTE — Progress Notes (Signed)
   Follow Up Note  RE: Tricia Munoz MRN: [id number] DOB: 1960-11-15 Date of Office Visit: 05/24/2019  Referring provider: Dianne Dun, MD Primary care provider: Dianne Dun, MD  History of Present Illness: I had the pleasure of seeing Tricia Munoz for a follow up visit at the [facility] on 05/24/2019. She is a 59 y.o. female, who is being followed for contact dermatitis. Today she is here for patch test placement, given suspected history of contact dermatitis.   Doing much better today.  Diagnostics: TRUE Test patches placed.   Assessment and Plan: Tricia Munoz is a 59 y.o. female with: Allergic contact dermatitis due to cosmetics TRUE patches placed today.    The patient was instructed regarding proper care of the patches for the next 48 hours. Do not get patches wet - avoid showering until the next visit. Do not engage in vigorous physical activity.  Patient will follow up in 48 hours and 96 hours for patch readings.  It was my pleasure to see Tricia Munoz today and participate in her care. Please feel free to contact me with any questions or concerns.  Sincerely,  Tricia Mood, DO Allergy & Immunology  [facility] office: [phone] [facility] office: [phone] [place] office: [phone]

## 2019-05-24 NOTE — Assessment & Plan Note (Signed)
TRUE patches placed today. 

## 2019-05-24 NOTE — Patient Instructions (Signed)
Patches placed today. Please avoid strenuous physical activities and do not get the patches on the back wet. No showering until final patch reading done. Okay to take antihistamines for itching but avoid placing any creams on the back where the patches are. We will remove the patches on Wednesday and will do our initial read. Then you will come back on Friday for a final read. 

## 2019-05-26 ENCOUNTER — Ambulatory Visit (INDEPENDENT_AMBULATORY_CARE_PROVIDER_SITE_OTHER): Payer: 59 | Admitting: Psychology

## 2019-05-26 ENCOUNTER — Other Ambulatory Visit: Payer: Self-pay

## 2019-05-26 ENCOUNTER — Encounter: Payer: Self-pay | Admitting: Allergy

## 2019-05-26 ENCOUNTER — Encounter: Payer: 59 | Admitting: Allergy

## 2019-05-26 ENCOUNTER — Ambulatory Visit: Payer: 59 | Admitting: Allergy

## 2019-05-26 DIAGNOSIS — F4323 Adjustment disorder with mixed anxiety and depressed mood: Secondary | ICD-10-CM

## 2019-05-26 DIAGNOSIS — L232 Allergic contact dermatitis due to cosmetics: Secondary | ICD-10-CM

## 2019-05-26 NOTE — Progress Notes (Signed)
Follow Up Note  RE: Tricia Munoz MRN: [id number] DOB: [birth year] Date of Office Visit: 05/26/2019  Referring provider: Dianne Dun, MD Primary care provider: Dianne Dun, MD  History of Present Illness: I had the pleasure of seeing Tricia Munoz for a [facility]  on 05/26/2019. She is a 59 y.o. female, who is being followed for dermatitis. Today she is here for initial patch test interpretation, given suspected history of contact dermatitis.   Some itching on the back. Complaining of right ear pain and headaches. Right ear on exam unremarkable.   Diagnostics:  TRUE TEST 48 hour reading:  T.R.U.E. Test - 05/26/19 1554    Time Antigen Placed  1600    Manufacturer  Other   [facility] [place]   Lot #  [id number]    Location  Back    Number of Test  36    Reading Interval  Day 3    Panel  Panel 1;Panel 2;Panel 3    1. Nickel Sulfate  0    2. Wool Alcohols  0    3. Neomycin Sulfate  0    4. Potassium Dichromate  0    5. Caine Mix  0    6. Fragrance Mix  0    7. Colophony  0    8. Paraben Mix  0    9. Negative Control  0    10. Balsam of [place]  0    11. Ethylenediamine Dihydrochloride  --   +/-   12. Cobalt Dichloride  0    13. p-tert Butylphenol Formaldehyde Resin  0    14. Epoxy Resin  0    15. Carba Mix  0    16.  Black Rubber Mix  0    17. Cl+ Me-Isothiazolinone  0    18. Quaternium-15  0    19. Methyldibromo Glutaronitrile  0    20. p-Phenylenediamine  0    21. Formaldehyde  0    22. Mercapto Mix  0    23. Thimerosal  0    24. Thiuram Mix  0    25. Diazolidinyl Urea  0    26. Quinoline Mix  0    27. Tixocortol-21-Pivalate  0    28. Gold Sodium Thiosulfate  --   +/-   29. Imidazolidinyl Urea  --   +/-   30. Budesonide  0    31. Hydrocortisone-17-Butyrate  0    32. Mercaptobenzothiazole  0    33. Bacitracin  0    34. Parthenolide  0    35. Disperse Blue 106  0    36. 2-Bromo-2-Nitropropane-1,3-diol  0        Assessment and Plan: Tricia Munoz is a 59 y.o. female with: Allergic contact dermatitis due to cosmetics 48 hour reading borderline positive to #11, #28 and #29.    The patient has been provided detailed information regarding the substances she is sensitive to, as well as products containing the substances.  Meticulous avoidance of these substances is recommended. If avoidance is not possible, the use of barrier creams or lotions is recommended.  If symptoms persist or progress despite meticulous avoidance of chemicals/substances above, dermatology evaluation may be warranted. Return in about 2 days (around 05/28/2019) for Patch reading.  It was my pleasure to see Tricia Munoz today and participate in her care. Please feel free to contact me with any questions or concerns.  Sincerely,  Rexene Alberts, DO Allergy & Immunology  [facility] office: [phone] [facility] office: [place] office: [phone]

## 2019-05-26 NOTE — Assessment & Plan Note (Signed)
48 hour reading borderline positive to #11, #28 and #29.

## 2019-05-28 ENCOUNTER — Encounter: Payer: Self-pay | Admitting: Family Medicine

## 2019-05-28 ENCOUNTER — Other Ambulatory Visit: Payer: Self-pay

## 2019-05-28 ENCOUNTER — Ambulatory Visit: Payer: 59 | Admitting: Family Medicine

## 2019-05-28 DIAGNOSIS — L232 Allergic contact dermatitis due to cosmetics: Secondary | ICD-10-CM

## 2019-05-28 NOTE — Patient Instructions (Signed)
TRUE TEST 96-hour hour reading: positive reaction to #3 (Neomycin sulfate), positive reaction to #28 (Gold sodium thiosulfate) and positive reaction to #29 (Imidazolidinyl urea)  Plan:   Allergic contact dermatitis - The patient has been provided detailed information regarding the substances she is sensitive to, as well as products containing the substances.   - Meticulous avoidance of these substances is recommended.  - If avoidance is not possible, the use of barrier creams or lotions is recommended. - If symptoms persist or progress despite meticulous avoidance of the substances listed above, dermatology referral may be warranted. - Continue cetirizine 10 mg once a day as needed - Continue Eucrisa to red, itchy areas twice a day as needed - I will email a list of products that do not contain the allergens listed above  Call the clinic if this treatment plan is not working well for you  Follow up in 3 months or sooner if needed.

## 2019-05-28 NOTE — Progress Notes (Signed)
    Follow-up Note  RE: Tricia Munoz MRN: [id number] DOB: [birth year] Date of Office Visit: 05/28/2019  Primary care provider: Dianne Dun, MD Referring provider: Dianne Dun, MD   [name] returns to the office today for the final patch test interpretation, given suspected history of contact dermatitis.    Diagnostics:  TRUE TEST 96-hour hour reading: positive reaction to #3 (Neomycin sulfate), positive reaction to #28 (Gold sodium thiosulfate) and positive reaction to #29 (Imidazolidinyl urea)  Plan:   Allergic contact dermatitis - The patient has been provided detailed information regarding the substances she is sensitive to, as well as products containing the substances.   - Meticulous avoidance of these substances is recommended.  - If avoidance is not possible, the use of barrier creams or lotions is recommended. - If symptoms persist or progress despite meticulous avoidance of the substances listed above, dermatology referral may be warranted. - Continue cetirizine 10 mg once a day as needed - Continue Eucrisa to red, itchy areas twice a day as needed - I will email a list of products that do not contain the allergens listed above  Call the clinic if this treatment plan is not working well for you  Follow up in 3 months or sooner if needed.

## 2019-06-22 ENCOUNTER — Ambulatory Visit (INDEPENDENT_AMBULATORY_CARE_PROVIDER_SITE_OTHER): Payer: 59 | Admitting: Psychology

## 2019-06-22 DIAGNOSIS — F4323 Adjustment disorder with mixed anxiety and depressed mood: Secondary | ICD-10-CM

## 2019-07-20 ENCOUNTER — Ambulatory Visit (INDEPENDENT_AMBULATORY_CARE_PROVIDER_SITE_OTHER): Payer: 59 | Admitting: Psychology

## 2019-07-20 DIAGNOSIS — F4323 Adjustment disorder with mixed anxiety and depressed mood: Secondary | ICD-10-CM

## 2019-08-31 ENCOUNTER — Ambulatory Visit (INDEPENDENT_AMBULATORY_CARE_PROVIDER_SITE_OTHER): Payer: 59 | Admitting: Psychology

## 2019-08-31 DIAGNOSIS — F4323 Adjustment disorder with mixed anxiety and depressed mood: Secondary | ICD-10-CM

## 2019-09-15 ENCOUNTER — Encounter: Payer: Self-pay | Admitting: Family Medicine

## 2019-09-15 ENCOUNTER — Other Ambulatory Visit: Payer: Self-pay

## 2019-09-15 ENCOUNTER — Ambulatory Visit (INDEPENDENT_AMBULATORY_CARE_PROVIDER_SITE_OTHER): Payer: 59 | Admitting: Family Medicine

## 2019-09-15 VITALS — BP 130/90 | HR 70 | Temp 98.7°F | Ht 62.0 in | Wt 115.1 lb

## 2019-09-15 DIAGNOSIS — I10 Essential (primary) hypertension: Secondary | ICD-10-CM | POA: Diagnosis not present

## 2019-09-15 DIAGNOSIS — E2839 Other primary ovarian failure: Secondary | ICD-10-CM

## 2019-09-15 DIAGNOSIS — F329 Major depressive disorder, single episode, unspecified: Secondary | ICD-10-CM | POA: Diagnosis not present

## 2019-09-15 DIAGNOSIS — F32A Depression, unspecified: Secondary | ICD-10-CM

## 2019-09-15 LAB — COMPREHENSIVE METABOLIC PANEL
ALT: 12 U/L (ref 0–35)
AST: 15 U/L (ref 0–37)
Albumin: 4.5 g/dL (ref 3.5–5.2)
Alkaline Phosphatase: 95 U/L (ref 39–117)
BUN: 12 mg/dL (ref 6–23)
CO2: 32 mEq/L (ref 19–32)
Calcium: 9.9 mg/dL (ref 8.4–10.5)
Chloride: 102 mEq/L (ref 96–112)
Creatinine, Ser: 0.74 mg/dL (ref 0.40–1.20)
GFR: 79.82 mL/min (ref >60.00)
Glucose, Bld: 84 mg/dL (ref 70–99)
Potassium: 5 mEq/L (ref 3.5–5.1)
Sodium: 138 mEq/L (ref 135–145)
Total Bilirubin: 0.3 mg/dL (ref 0.2–1.2)
Total Protein: 7 g/dL (ref 6.0–8.3)

## 2019-09-15 LAB — CBC WITH DIFFERENTIAL/PLATELET
Basophils Absolute: 0.1 10*3/uL (ref 0.0–0.1)
Basophils Relative: 1.4 % (ref 0.0–3.0)
Eosinophils Absolute: 0 10*3/uL (ref 0.0–0.7)
Eosinophils Relative: 0.6 % (ref 0.0–5.0)
HCT: 39.9 % (ref 36.0–46.0)
Hemoglobin: 13.3 g/dL (ref 12.0–15.0)
Lymphocytes Relative: 27.7 % (ref 12.0–46.0)
Lymphs Abs: 1.6 10*3/uL (ref 0.7–4.0)
MCHC: 33.4 g/dL (ref 30.0–36.0)
MCV: 97 fl (ref 78.0–100.0)
Monocytes Absolute: 0.4 10*3/uL (ref 0.1–1.0)
Monocytes Relative: 7.6 % (ref 3.0–12.0)
Neutro Abs: 3.7 10*3/uL (ref 1.4–7.7)
Neutrophils Relative %: 62.7 % (ref 43.0–77.0)
Platelets: 311 10*3/uL (ref 150.0–400.0)
RBC: 4.11 Mil/uL (ref 3.87–5.11)
RDW: 13 % (ref 11.5–15.5)
WBC: 5.9 10*3/uL (ref 4.0–10.5)

## 2019-09-15 LAB — LIPID PANEL
Cholesterol: 228 mg/dL — ABNORMAL HIGH (ref 0–200)
HDL: 83.6 mg/dL (ref >39.00)
LDL Cholesterol: 130 mg/dL — ABNORMAL HIGH (ref 0–99)
NonHDL: 144.46
Total CHOL/HDL Ratio: 3
Triglycerides: 73 mg/dL (ref 0.0–149.0)
VLDL: 14.6 mg/dL (ref 0.0–40.0)

## 2019-09-15 LAB — VITAMIN D 25 HYDROXY (VIT D DEFICIENCY, FRACTURES): VITD: 68.3 ng/mL (ref 30.00–100.00)

## 2019-09-15 MED ORDER — BENAZEPRIL HCL 10 MG PO TABS: 10.0000 mg | ORAL_TABLET | Freq: Every day | ORAL | 3 refills | Status: DC

## 2019-09-15 MED ORDER — VENLAFAXINE HCL ER 150 MG PO CP24: 150.0000 mg | ORAL_CAPSULE | Freq: Every day | ORAL | 3 refills | Status: DC

## 2019-09-15 MED ORDER — HYDROCHLOROTHIAZIDE 12.5 MG PO TABS: 12.5000 mg | ORAL_TABLET | Freq: Every day | ORAL | 3 refills | Status: DC

## 2019-09-15 NOTE — Progress Notes (Signed)
Subjective:    Patient ID: Tricia Munoz, female    DOB: 11-Apr-1961, 59 y.o.   MRN: [id number]  HPI Chief Complaint  Patient presents with   Medication Refill    TOC scheduled for 6/28 - needs refills Lotensin, HCTZ, Effexor, optivar, Dymista and albuterol HFA   This is a 59 year old female who presents today for medication refill prior to establishing care at the end of the month.  Previous patient of Dr. Clifton Custard. Depression-she reports that this is well controlled on venlafaxine XR 150 mg daily. Hypertension-no side effects with benazepril 5 mg and hydrochlorothiazide 12.5 mg.  She has been taking getting higher strength pills and cutting them in half.  She is agreeable to changing her prescription to the actual dose she is taking. Asthma-rare symptoms  Review of Systems Denies chest pain, shortness of breath, wheeze, abdominal pain, diarrhea/constipation, dysuria, hematuria, muscle or joint pain    Objective:   Physical Exam Physical Exam  Constitutional: Oriented to person, place, and time. She appears well-developed and well-nourished.  HENT:  Head: Normocephalic and atraumatic.  Eyes: Conjunctivae are normal.  Neck: Normal range of motion. Neck supple.  Cardiovascular: Normal rate, regular rhythm and normal heart sounds.   Pulmonary/Chest: Effort normal and breath sounds normal.  Musculoskeletal: No LE edema.  Neurological: Alert and oriented to person, place, and time.  Skin: Skin is warm and dry.  Psychiatric: Normal mood and affect. Behavior is normal. Judgment and thought content normal.  Vitals reviewed.     BP 130/90 (BP Location: Left Arm, Patient Position: Sitting, Cuff Size: Normal)    Pulse 70    Temp 98.7 F (37.1 C) (Temporal)    Ht 5\' 2"  (1.575 m)    Wt 115 lb 1.9 oz (52.2 kg)    LMP 11/06/2012    SpO2 97%    BMI 21.06 kg/m  Wt Readings from Last 3 Encounters:  09/15/19 115 lb 1.9 oz (52.2 kg)  05/11/19 119 lb 3.2 oz (54.1 kg)  05/10/19 115 lb (52.2  kg)   Depression screen [facility] 2/9 09/15/2019 10/08/2017 06/09/2017 07/18/2015 07/05/2014  Decreased Interest 0 0 3 0 0  Down, Depressed, Hopeless 0 0 2 0 0  PHQ - 2 Score 0 0 5 0 0  Altered sleeping - 0 0 - -  Tired, decreased energy - 1 3 - -  Change in appetite - 0 0 - -  Feeling bad or failure about yourself  - 0 1 - -  Trouble concentrating - 2 1 - -  Moving slowly or fidgety/restless - 1 1 - -  Suicidal thoughts - 0 0 - -  PHQ-9 Score - 4 11 - -  Difficult doing work/chores - Somewhat difficult Very difficult - -       Assessment & Plan:  1. Depression, unspecified depression type - venlafaxine XR (EFFEXOR XR) 150 MG 24 hr capsule; Take 1 capsule (150 mg total) by mouth daily with breakfast.  Dispense: 90 capsule; Refill: 3 - VITAMIN D 25 Hydroxy (Vit-D Deficiency, Fractures)  2. Essential hypertension - benazepril (LOTENSIN) 10 MG tablet; Take 1 tablet (10 mg total) by mouth daily.  Dispense: 90 tablet; Refill: 3 - hydrochlorothiazide (HYDRODIURIL) 12.5 MG tablet; Take 1 tablet (12.5 mg total) by mouth daily.  Dispense: 90 tablet; Refill: 3 - Lipid Panel - Comprehensive metabolic panel - CBC with Differential  3. Estrogen deficiency - VITAMIN D 25 Hydroxy (Vit-D Deficiency, Fractures)  -Follow-up as scheduled to end of the  month to establish care  This visit occurred during the SARS-CoV-2 public health emergency.  Safety protocols were in place, including screening questions prior to the visit, additional usage of staff PPE, and extensive cleaning of exam room while observing appropriate contact time as indicated for disinfecting solutions.    Clarene Reamer, FNP-BC  [facility]  09/18/2019 7:45 AM

## 2019-09-15 NOTE — Patient Instructions (Signed)
Good to see you today  Please go to the lab, I will notify you of your lab results through Mychart  See you at the end of the month

## 2019-10-11 ENCOUNTER — Encounter: Payer: 59 | Admitting: Family Medicine

## 2019-10-13 ENCOUNTER — Ambulatory Visit (INDEPENDENT_AMBULATORY_CARE_PROVIDER_SITE_OTHER): Payer: 59 | Admitting: Psychology

## 2019-10-13 DIAGNOSIS — F4323 Adjustment disorder with mixed anxiety and depressed mood: Secondary | ICD-10-CM | POA: Diagnosis not present

## 2019-11-23 ENCOUNTER — Ambulatory Visit (INDEPENDENT_AMBULATORY_CARE_PROVIDER_SITE_OTHER): Payer: 59 | Admitting: Psychology

## 2019-11-23 DIAGNOSIS — F4323 Adjustment disorder with mixed anxiety and depressed mood: Secondary | ICD-10-CM | POA: Diagnosis not present

## 2019-12-14 ENCOUNTER — Ambulatory Visit (INDEPENDENT_AMBULATORY_CARE_PROVIDER_SITE_OTHER): Payer: 59 | Admitting: Psychology

## 2019-12-14 DIAGNOSIS — F4323 Adjustment disorder with mixed anxiety and depressed mood: Secondary | ICD-10-CM | POA: Diagnosis not present

## 2020-01-13 ENCOUNTER — Ambulatory Visit: Payer: 59 | Admitting: Psychology

## 2020-02-05 ENCOUNTER — Telehealth (INDEPENDENT_AMBULATORY_CARE_PROVIDER_SITE_OTHER): Payer: 59 | Admitting: Family Medicine

## 2020-02-05 VITALS — Wt 115.0 lb

## 2020-02-05 DIAGNOSIS — J4521 Mild intermittent asthma with (acute) exacerbation: Secondary | ICD-10-CM | POA: Diagnosis not present

## 2020-02-05 DIAGNOSIS — J019 Acute sinusitis, unspecified: Secondary | ICD-10-CM

## 2020-02-05 DIAGNOSIS — Z9109 Other allergy status, other than to drugs and biological substances: Secondary | ICD-10-CM

## 2020-02-05 MED ORDER — AEROCHAMBER PLUS MISC: 0 refills | Status: DC

## 2020-02-05 MED ORDER — PREDNISONE 20 MG PO TABS: 40.0000 mg | ORAL_TABLET | Freq: Every day | ORAL | 0 refills | Status: DC

## 2020-02-05 MED ORDER — AZITHROMYCIN 250 MG PO TABS: ORAL_TABLET | ORAL | 0 refills | Status: DC

## 2020-02-05 MED ORDER — ALBUTEROL SULFATE HFA 108 (90 BASE) MCG/ACT IN AERS: 2.0000 | INHALATION_SPRAY | RESPIRATORY_TRACT | 2 refills | Status: DC | PRN

## 2020-02-05 MED ORDER — BUDESONIDE-FORMOTEROL FUMARATE 80-4.5 MCG/ACT IN AERO: 2.0000 | INHALATION_SPRAY | Freq: Two times a day (BID) | RESPIRATORY_TRACT | 0 refills | Status: DC

## 2020-02-05 NOTE — Progress Notes (Signed)
Virtual Visit via Video Note  I connected with Tricia Munoz  on 02/05/20 at 10:20 AM EDT by a video enabled telemedicine application and verified that I am speaking with the correct person using two identifiers.  Location patient: home Location provider: [facility]  [address] [place], [place] [address] Persons participating in the virtual visit: patient, provider  I discussed the limitations of evaluation and management by telemedicine and the availability of in person appointments. The patient expressed understanding and agreed to proceed.   Tricia Munoz DOB: May 26, 1960 Encounter date: 02/05/2020  This is a 59 y.o. female who presents with Chief Complaint  Patient presents with  . Allergic Rhinitis   . Sore Throat  . Cough  . Otalgia    History of present illness:  Father has terminal cancer and lives in very old, moldy house, dusty. Turned heat on Sunday and feels like there is mold in duct system. She is very allergic to mold. First of week started in throat, then goes to ears, body aches, and then just feels like it goes straight into infection and just has allergic response to everything. Has done nasal sprays, using mucinex, tylenol. She is there 4-5 days/week. Needs rx for inhaler, which she is out of. Has asthma-related allergies and can tell due to chest. This is a typical response to allergy flare for her. She has done well in past with zithromax. Tries to avoid prednisone due to ADHD. She is worried because she will be going back to his house to stay all week and knows that allergies will continue to worsen.   Allergies  Allergen Reactions  . Evekeo [Amphetamine Sulfate]   . Montelukast     itch  . Singulair [Montelukast Sodium] Rash   Current Meds  Medication Sig  . albuterol (PROVENTIL HFA;VENTOLIN HFA) 108 (90 Base) MCG/ACT inhaler Inhale 2 puffs into the lungs every 6 (six) hours as needed for wheezing or shortness of breath.  Marland Kitchen  azelastine (OPTIVAR) 0.05 % ophthalmic solution Apply 1 drop to eye 2 (two) times daily.  . Azelastine-Fluticasone 137-50 MCG/ACT SUSP 2 spray in each nostril daily  . benazepril (LOTENSIN) 10 MG tablet Take 1 tablet (10 mg total) by mouth daily.  . Cholecalciferol (VITAMIN D) 2000 units CAPS Take by mouth.  Lennox Solders (EUCRISA) 2 % OINT Apply 1 application topically 2 (two) times daily.  . Cyanocobalamin (VITAMIN B 12 PO) Take 3,000 mcg by mouth.  . hydrochlorothiazide (HYDRODIURIL) 12.5 MG tablet Take 1 tablet (12.5 mg total) by mouth daily.  Marland Kitchen levocetirizine (XYZAL) 5 MG tablet TAKE ONE TABLET BY MOUTH EACH EVENING  . Multiple Vitamins-Minerals (WOMENS MULTIVITAMIN PO) Take by mouth.  . simvastatin (ZOCOR) 10 MG tablet TAKE 1 TABLET BY MOUTH EVERYDAY AT BEDTIME  . venlafaxine XR (EFFEXOR XR) 150 MG 24 hr capsule Take 1 capsule (150 mg total) by mouth daily with breakfast.  . vitamin C (ASCORBIC ACID) 250 MG tablet Take 250 mg by mouth daily.    Review of Systems  Constitutional: Negative for chills and fever.  HENT: Positive for congestion, ear pain, postnasal drip, sinus pressure, sinus pain and sore throat.   Respiratory: Positive for cough, shortness of breath and wheezing.   Cardiovascular: Negative for chest pain.    Objective:  Wt 115 lb (52.2 kg)   LMP 11/06/2012   BMI 21.03 kg/m   Weight: 115 lb (52.2 kg)   BP Readings from Last 3 Encounters:  09/15/19 130/90  05/24/19 122/74  05/11/19 138/90   Wt Readings from Last 3 Encounters:  02/05/20 115 lb (52.2 kg)  09/15/19 115 lb 1.9 oz (52.2 kg)  05/11/19 119 lb 3.2 oz (54.1 kg)    EXAM:  GENERAL: alert, oriented, appears well and in no acute distress  HEENT: atraumatic, conjunctiva clear, no obvious abnormalities on inspection of external nose and ears. Significant nasal congestion, voice hoarse.  NECK: normal movements of the head and neck  LUNGS: on inspection no signs of respiratory distress, breathing rate  appears normal, no obvious gross SOB, gasping or wheezing. Occasional coughing.   CV: no obvious cyanosis  MS: moves all visible extremities without noticeable abnormality  PSYCH/NEURO: pleasant and cooperative, no obvious depression or anxiety, speech and thought processing grossly intact   Assessment/Plan  1. Mild intermittent asthma with exacerbation She does not do well with prednisone, but we have sent in some as back up if needed for worsening of breathing. In meanwhile, albuterol inhaler refilled and spacer sent in. We discussed how to use this. Also sending in symbicort for her. This may be able to help with asthma without need for prednisone. Continue with allergy medication. Let us know if any worsening of symptoms.   2. Environmental allergies Continue with steroid/antihistamine nasal spray, antihistamine. Cannot tolerate singulair.   3. Sinusitis She has continuous allergies; worse in recent week with flare. Will cover with antibiotic. Needs eval if worsening.   I discussed the assessment and treatment plan with the patient. The patient was provided an opportunity to ask questions and all were answered. The patient agreed with the plan and demonstrated an understanding of the instructions.   The patient was advised to call back or seek an in-person evaluation if the symptoms worsen or if the condition fails to improve as anticipated.  I provided 15 minutes of non-face-to-face time during this encounter.   Theodis Shove, MD

## 2020-04-19 ENCOUNTER — Ambulatory Visit (INDEPENDENT_AMBULATORY_CARE_PROVIDER_SITE_OTHER): Payer: 59 | Admitting: Psychology

## 2020-04-19 DIAGNOSIS — F4323 Adjustment disorder with mixed anxiety and depressed mood: Secondary | ICD-10-CM | POA: Diagnosis not present

## 2020-04-25 ENCOUNTER — Ambulatory Visit: Payer: 59 | Admitting: Psychology

## 2020-05-01 ENCOUNTER — Ambulatory Visit (INDEPENDENT_AMBULATORY_CARE_PROVIDER_SITE_OTHER): Payer: 59 | Admitting: Psychology

## 2020-05-01 DIAGNOSIS — F4323 Adjustment disorder with mixed anxiety and depressed mood: Secondary | ICD-10-CM

## 2020-05-11 ENCOUNTER — Ambulatory Visit (INDEPENDENT_AMBULATORY_CARE_PROVIDER_SITE_OTHER): Payer: 59 | Admitting: Psychology

## 2020-05-11 DIAGNOSIS — F4323 Adjustment disorder with mixed anxiety and depressed mood: Secondary | ICD-10-CM | POA: Diagnosis not present

## 2020-05-19 ENCOUNTER — Ambulatory Visit (INDEPENDENT_AMBULATORY_CARE_PROVIDER_SITE_OTHER): Payer: 59 | Admitting: Psychology

## 2020-05-19 DIAGNOSIS — F4323 Adjustment disorder with mixed anxiety and depressed mood: Secondary | ICD-10-CM

## 2020-05-26 ENCOUNTER — Ambulatory Visit: Payer: 59 | Admitting: Psychology

## 2020-06-01 ENCOUNTER — Ambulatory Visit (INDEPENDENT_AMBULATORY_CARE_PROVIDER_SITE_OTHER): Payer: 59 | Admitting: Psychology

## 2020-06-01 DIAGNOSIS — F4323 Adjustment disorder with mixed anxiety and depressed mood: Secondary | ICD-10-CM

## 2020-06-16 ENCOUNTER — Ambulatory Visit: Payer: 59 | Admitting: Psychology

## 2020-07-07 ENCOUNTER — Ambulatory Visit (INDEPENDENT_AMBULATORY_CARE_PROVIDER_SITE_OTHER): Payer: 59 | Admitting: Psychology

## 2020-07-07 DIAGNOSIS — F4323 Adjustment disorder with mixed anxiety and depressed mood: Secondary | ICD-10-CM | POA: Diagnosis not present

## 2020-07-12 ENCOUNTER — Encounter: Payer: 59 | Admitting: Family Medicine

## 2020-07-13 ENCOUNTER — Other Ambulatory Visit: Payer: Self-pay

## 2020-07-13 ENCOUNTER — Encounter: Payer: Self-pay | Admitting: Family Medicine

## 2020-07-13 ENCOUNTER — Ambulatory Visit (INDEPENDENT_AMBULATORY_CARE_PROVIDER_SITE_OTHER): Payer: 59 | Admitting: Family Medicine

## 2020-07-13 VITALS — BP 126/78 | HR 76 | Temp 97.2°F | Ht 62.0 in | Wt 115.3 lb

## 2020-07-13 DIAGNOSIS — J3089 Other allergic rhinitis: Secondary | ICD-10-CM

## 2020-07-13 DIAGNOSIS — H6120 Impacted cerumen, unspecified ear: Secondary | ICD-10-CM | POA: Insufficient documentation

## 2020-07-13 DIAGNOSIS — H6122 Impacted cerumen, left ear: Secondary | ICD-10-CM | POA: Diagnosis not present

## 2020-07-13 NOTE — Progress Notes (Signed)
Subjective:    Patient ID: Tricia Munoz, female    DOB: 1960-09-24, 60 y.o.   MRN: [id number]  This visit occurred during the SARS-CoV-2 public health emergency.  Safety protocols were in place, including screening questions prior to the visit, additional usage of staff PPE, and extensive cleaning of exam room while observing appropriate contact time as indicated for disinfecting solutions.    HPI 60 yo pt of NP Leone Payor presents with cerumen impaction   Wt Readings from Last 3 Encounters:  07/13/20 115 lb 5 oz (52.3 kg)  02/05/20 115 lb (52.2 kg)  09/15/19 115 lb 1.9 oz (52.2 kg)   21.09 kg/m   Ear fullness/ in setting of narrow ear canal  Knows she has a bit at entrance of L ear   Possibly wax  She has hearing aid fitting appt tomorrow  (has significant hearing loss worse in the L ear)   Allergies  Worse now/spring pollen  Very congested No colored nasal mucous or sinus pain   Takes xyzal 5 mg  Also azelastine-fluticasone daily  Does not tolerate singulair, cannot take it   Patient Active Problem List   Diagnosis Date Noted  . Cerumen impaction 07/13/2020  . Allergic contact dermatitis due to cosmetics 05/24/2019  . Edema of eyelid of both eyes 05/10/2019  . Dysuria 08/03/2018  . Skin lesion 05/25/2018  . Word finding problem 12/11/2017  . Adult ADHD (attention deficit hyperactivity disorder) 12/11/2017  . 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 Transient hypersomnia 12/11/2017  . Memory difficulties 10/08/2017  . Head injury 08/04/2017  . Post concussive syndrome 08/04/2017  . Anxiety 06/09/2017  . Mild persistent asthma 12/03/2016  . Perennial and seasonal allergic rhinitis 10/04/2015  . Anemia 07/18/2015  . Adjustment disorder with mixed anxiety and depressed mood 03/21/2015  . HLD (hyperlipidemia) 07/05/2014  . Depression 12/09/2013  . HTN (hypertension) 03/18/2012  . Osteoarthritis 02/17/2009  . POLYARTHRITIS 02/17/2009  . OSTEOPENIA 02/17/2009    Past Medical History:  Diagnosis Date  . ADHD   . Asthma   . Hypertension   . OA (osteoarthritis)   . Osteopenia    No past surgical history on file. Social History   Tobacco Use  . Smoking status: Never Smoker  . Smokeless tobacco: Never Used  Vaping Use  . Vaping Use: Never used  Substance Use Topics  . Alcohol use: No  . Drug use: No   Family History  Problem Relation Age of Onset  . Dementia Mother   . Hypertension Father   . Allergic rhinitis Father   . Kidney disease Sister   . Breast cancer Maternal Aunt 4       1/2 sister  . Angioedema Neg Hx   . Asthma Neg Hx   . Eczema Neg Hx   . Immunodeficiency Neg Hx   . Urticaria Neg Hx    Allergies  Allergen Reactions  . Evekeo [Amphetamine Sulfate]   . Montelukast     itch  . Singulair [Montelukast Sodium] Rash   Current Outpatient Medications on File Prior to Visit  Medication Sig Dispense Refill  . albuterol (VENTOLIN HFA) 108 (90 Base) MCG/ACT inhaler Inhale 2 puffs into the lungs every 4 (four) hours as needed for wheezing or shortness of breath. 18 g 2  . azelastine (OPTIVAR) 0.05 % ophthalmic solution Apply 1 drop to eye 2 (two) times daily. 6 mL 12  . Azelastine-Fluticasone 137-50 MCG/ACT SUSP 2 spray in each nostril daily 1 Bottle 3  .  azithromycin (ZITHROMAX) 250 MG tablet Take 2 tablets daily on day 1, then 1 tablet daily days 2-5. 6 tablet 0  . benazepril (LOTENSIN) 10 MG tablet Take 1 tablet (10 mg total) by mouth daily. 90 tablet 3  . budesonide-formoterol (SYMBICORT) 80-4.5 MCG/ACT inhaler Inhale 2 puffs into the lungs in the morning and at bedtime. 1 each 0  . Cholecalciferol (VITAMIN D) 2000 units CAPS Take by mouth.    Lennox Solders (EUCRISA) 2 % OINT Apply 1 application topically 2 (two) times daily. 100 g 5  . Cyanocobalamin (VITAMIN B 12 PO) Take 3,000 mcg by mouth.    . hydrochlorothiazide (HYDRODIURIL) 12.5 MG tablet Take 1 tablet (12.5 mg total) by mouth daily. 90 tablet 3  .  levocetirizine (XYZAL) 5 MG tablet TAKE ONE TABLET BY MOUTH EACH EVENING 30 tablet 11  . Multiple Vitamins-Minerals (WOMENS MULTIVITAMIN PO) Take by mouth.    . predniSONE (DELTASONE) 20 MG tablet Take 2 tablets (40 mg total) by mouth daily with breakfast. 10 tablet 0  . simvastatin (ZOCOR) 10 MG tablet TAKE 1 TABLET BY MOUTH EVERYDAY AT BEDTIME 90 tablet 1  . Spacer/Aero-Holding Chambers (AEROCHAMBER PLUS) inhaler Use as instructed 1 each 0  . venlafaxine XR (EFFEXOR XR) 150 MG 24 hr capsule Take 1 capsule (150 mg total) by mouth daily with breakfast. 90 capsule 3  . vitamin C (ASCORBIC ACID) 250 MG tablet Take 250 mg by mouth daily.     No current facility-administered medications on file prior to visit.    Review of Systems  Constitutional: Negative for activity change, appetite change, fatigue, fever and unexpected weight change.  HENT: Positive for congestion, hearing loss, postnasal drip and rhinorrhea. Negative for ear discharge, ear pain, sinus pressure, sinus pain, sore throat and voice change.   Eyes: Negative for pain, redness and visual disturbance.  Respiratory: Negative for cough, shortness of breath and wheezing.   Cardiovascular: Negative for chest pain and palpitations.  Gastrointestinal: Negative for abdominal pain, blood in stool, constipation and diarrhea.  Endocrine: Negative for polydipsia and polyuria.  Genitourinary: Negative for dysuria, frequency and urgency.  Musculoskeletal: Negative for arthralgias, back pain and myalgias.  Skin: Negative for pallor and rash.  Allergic/Immunologic: Negative for environmental allergies.  Neurological: Negative for dizziness, syncope and headaches.  Hematological: Negative for adenopathy. Does not bruise/bleed easily.  Psychiatric/Behavioral: Negative for decreased concentration and dysphoric mood. The patient is not nervous/anxious.        Objective:   Physical Exam Constitutional:      General: She is not in acute  distress.    Appearance: Normal appearance. She is normal weight. She is not ill-appearing.  HENT:     Head: Normocephalic and atraumatic.     Right Ear: Tympanic membrane, ear canal and external ear normal. There is no impacted cerumen.     Left Ear: Tympanic membrane, ear canal and external ear normal.     Ears:     Comments: Small amt of wet cerumen close to entrance of L ear canal Removed with curette Small area of abrasion noted after use (no pain)  No bleeding or erythema        Nose:     Comments: Boggy nares  Congested Sniffles frequently Eyes:     General:        Right eye: No discharge.        Left eye: No discharge.     Conjunctiva/sclera: Conjunctivae normal.     Pupils: Pupils are equal,  round, and reactive to light.  Cardiovascular:     Rate and Rhythm: Normal rate and regular rhythm.  Musculoskeletal:     Cervical back: Normal range of motion and neck supple.  Lymphadenopathy:     Cervical: No cervical adenopathy.  Skin:    General: Skin is warm and dry.     Coloration: Skin is not pale.     Findings: No erythema or rash.  Neurological:     Mental Status: She is alert.     Cranial Nerves: No cranial nerve deficit.  Psychiatric:        Mood and Affect: Mood normal.           Assessment & Plan:   Problem List Items Addressed This Visit      Respiratory   Perennial and seasonal allergic rhinitis    Worse in season now with chronic congestion  Takes xyzal- recommend change to zyrtec to see if more effective Uses azelastine-fluticasone nasal spray Adv to inc to bid for 3-4 d and then return to daily  Also nasal saline spray prn for congestion  Update if not starting to improve in a week or if worsening   Disc s/s of sinusitis to watch for         Nervous and Auditory   Cerumen impaction - Primary    Small amount of wet cerumen removed from L ear canal close to entrance (with curette)  Tolerated well  Otherwise nl exam  Plans to get fitted  for hearing aides tomorrow

## 2020-07-13 NOTE — Patient Instructions (Addendum)
Try nasal saline spray as often as you want for congestion   Increase your nasal spray to twice daily for 3-4 days and then go back to daily   Consider change from xyzal to zyrtec  10 mg daily (don't get name brand)   Ear looks good  I made some red marks/abrasion  If any pain let me know

## 2020-07-13 NOTE — Assessment & Plan Note (Signed)
Worse in season now with chronic congestion  Takes xyzal- recommend change to zyrtec to see if more effective Uses azelastine-fluticasone nasal spray Adv to inc to bid for 3-4 d and then return to daily  Also nasal saline spray prn for congestion  Update if not starting to improve in a week or if worsening   Disc s/s of sinusitis to watch for

## 2020-07-13 NOTE — Assessment & Plan Note (Signed)
Small amount of wet cerumen removed from L ear canal close to entrance (with curette)  Tolerated well  Otherwise nl exam  Plans to get fitted for hearing aides tomorrow

## 2020-07-20 ENCOUNTER — Ambulatory Visit (INDEPENDENT_AMBULATORY_CARE_PROVIDER_SITE_OTHER): Payer: 59 | Admitting: Family Medicine

## 2020-07-20 ENCOUNTER — Encounter: Payer: Self-pay | Admitting: Family Medicine

## 2020-07-20 ENCOUNTER — Other Ambulatory Visit: Payer: Self-pay

## 2020-07-20 VITALS — BP 110/70 | HR 90 | Temp 97.9°F | Ht 62.0 in | Wt 116.0 lb

## 2020-07-20 DIAGNOSIS — J4530 Mild persistent asthma, uncomplicated: Secondary | ICD-10-CM

## 2020-07-20 DIAGNOSIS — J3089 Other allergic rhinitis: Secondary | ICD-10-CM | POA: Diagnosis not present

## 2020-07-20 DIAGNOSIS — R7689 Other specified abnormal immunological findings in serum: Secondary | ICD-10-CM

## 2020-07-20 DIAGNOSIS — E785 Hyperlipidemia, unspecified: Secondary | ICD-10-CM

## 2020-07-20 DIAGNOSIS — I10 Essential (primary) hypertension: Secondary | ICD-10-CM | POA: Diagnosis not present

## 2020-07-20 DIAGNOSIS — F325 Major depressive disorder, single episode, in full remission: Secondary | ICD-10-CM | POA: Diagnosis not present

## 2020-07-20 DIAGNOSIS — M2550 Pain in unspecified joint: Secondary | ICD-10-CM

## 2020-07-20 DIAGNOSIS — R768 Other specified abnormal immunological findings in serum: Secondary | ICD-10-CM

## 2020-07-20 MED ORDER — BUDESONIDE-FORMOTEROL FUMARATE 80-4.5 MCG/ACT IN AERO: 2.0000 | INHALATION_SPRAY | Freq: Two times a day (BID) | RESPIRATORY_TRACT | 0 refills | Status: AC

## 2020-07-20 MED ORDER — SIMVASTATIN 10 MG PO TABS: 10.0000 mg | ORAL_TABLET | Freq: Every day | ORAL | 3 refills | Status: DC

## 2020-07-20 NOTE — Patient Instructions (Addendum)
Your DEXA showed osteopenia.   This means that she is at risk for developing osteoporosis and have some signs of low bone mass.   Would recommend the following:   1) 800 units of Vitamin D daily 2) Get 1200 mg of elemental calcium --- this is best from your diet. Try to track how much calcium you get on a typical day. You could find ways to add more (dairy products, leafy greens). Take a supplement for whatever you don't typically get so you reach 1200 mg of calcium.  3) Physical activity (ideally weight bearing) - like walking briskly 30 minutes 5 days a week.   We can plan to repeat in 3-5 years  Please call the location of your choice from the menu below to schedule your Mammogram and/or Bone Density appointment.    [place]   [facility]. [facility]                      Phone:  [phone] [address]                               [place], [place] [address]                                                             Services: Traditional and 3D Mammogram, Bone Density

## 2020-07-20 NOTE — Assessment & Plan Note (Signed)
Doing well. Cont effexor 150 mg

## 2020-07-20 NOTE — Assessment & Plan Note (Signed)
Well controlled. Cont simvastatin 10 mg

## 2020-07-20 NOTE — Assessment & Plan Note (Signed)
Advised restarting flonase alone and considering switching between xyzal/claritin/zyrtec to see if improvement from that.

## 2020-07-20 NOTE — Progress Notes (Signed)
Subjective:     Tricia Munoz is a 60 y.o. female presenting for Transitions Of Care (Will make another appt for her PAP ), Ear Pain (R ), and Referral (Mammogram- [facility])     HPI  #allergy season - allergic to singulair - feels like xyzal is not working great - having some ear pain too  - asthma is doing well  -   Review of Systems   Social History   Tobacco Use  Smoking Status Never Smoker  Smokeless Tobacco Never Used        Objective:    BP Readings from Last 3 Encounters:  07/20/20 110/70  07/13/20 126/78  09/15/19 130/90   Wt Readings from Last 3 Encounters:  07/20/20 116 lb (52.6 kg)  07/13/20 115 lb 5 oz (52.3 kg)  02/05/20 115 lb (52.2 kg)    BP 110/70   Pulse 90   Temp 97.9 F (36.6 C) (Temporal)   Ht 5\' 2"  (1.575 m)   Wt 116 lb (52.6 kg)   LMP 11/06/2012   SpO2 96%   BMI 21.22 kg/m    Physical Exam Constitutional:      General: She is not in acute distress.    Appearance: She is well-developed. She is not diaphoretic.  HENT:     Right Ear: Tympanic membrane and external ear normal.     Left Ear: Tympanic membrane and external ear normal.  Eyes:     Conjunctiva/sclera: Conjunctivae normal.  Cardiovascular:     Rate and Rhythm: Normal rate and regular rhythm.     Heart sounds: No murmur heard.   Pulmonary:     Effort: Pulmonary effort is normal. No respiratory distress.     Breath sounds: Normal breath sounds. No wheezing.  Musculoskeletal:     Cervical back: Neck supple.  Skin:    General: Skin is warm and dry.     Capillary Refill: Capillary refill takes less than 2 seconds.  Neurological:     Mental Status: She is alert. Mental status is at baseline.  Psychiatric:        Mood and Affect: Mood normal.        Behavior: Behavior normal.      The 10-year ASCVD risk score 11/08/2012 DC Jr., et al., 2013) is: 3.1%   Values used to calculate the score:     Age: 14 years     Sex: Female     Is Non-Hispanic African  American: No     Diabetic: No     Tobacco smoker: No     Systolic Blood Pressure: 110 mmHg     Is BP treated: Yes     HDL Cholesterol: 83.6 mg/dL     Total Cholesterol: 228 mg/dL      Assessment & Plan:   Problem List Items Addressed This Visit      Cardiovascular and Mediastinum   HTN (hypertension)    Well controlled. Cont hctz 12.5 mg, benazepril 10 mg.       Relevant Medications   simvastatin (ZOCOR) 10 MG tablet     Respiratory   Perennial and seasonal allergic rhinitis    Advised restarting flonase alone and considering switching between xyzal/claritin/zyrtec to see if improvement from that.       Mild persistent asthma - Primary    Well controlled. 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 Hx of positive RF 2010. Will refer to rheum for evaluation.        Relevant Orders   Ambulatory referral to Rheumatology   Major depressive disorder, single episode, in remission [facility])    Doing well. Cont effexor 150 mg       HLD (hyperlipidemia)    Well controlled. Cont simvastatin 10 mg      Relevant Medications   simvastatin (ZOCOR) 10 MG tablet    Other Visit Diagnoses    Rheumatoid factor positive       Relevant Orders   Ambulatory referral to Rheumatology       Return in about 1 year (around 07/20/2021) for annual exam.  Lynnda Child, MD  This visit occurred during the SARS-CoV-2 public health emergency.  Safety protocols were in place, including screening questions prior to the visit, additional usage of staff PPE, and extensive cleaning of exam room while observing appropriate contact time as indicated for disinfecting solutions.

## 2020-07-20 NOTE — Assessment & Plan Note (Signed)
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 Hx of positive RF 2010. Will refer to rheum for evaluation.

## 2020-07-20 NOTE — Assessment & Plan Note (Signed)
Well controlled. Symbicort 80-4.5 mg and prn albuterol

## 2020-07-20 NOTE — Assessment & Plan Note (Signed)
Well controlled. Cont hctz 12.5 mg, benazepril 10 mg.

## 2020-09-05 ENCOUNTER — Ambulatory Visit: Payer: 59 | Admitting: Internal Medicine

## 2020-09-13 ENCOUNTER — Ambulatory Visit: Payer: 59 | Admitting: Rheumatology

## 2020-09-13 NOTE — Progress Notes (Signed)
Office Visit Note  Patient: Tricia Munoz             Date of Birth: 1960-12-18           MRN: [id number]             PCP: Lynnda Child, MD Referring: Lynnda Child, MD Visit Date: 09/14/2020 Occupation: Tax adviser associate  Subjective:  New Patient (Initial Visit) (Bil hip pain, bil foot pain, bil hand and wrist pain)   History of Present Illness: Tricia Munoz is a 60 y.o. female here for evaluation of polyarthralgia with bony changes noted in bilateral hands and has had previously elevated rheumatoid factor test since 2010.  She has had some degree of chronic joint pain involving both hands, both wrists, both hips, and both feet but these have been significantly increased recently especially since around November of last year.  Her biggest complaint today is of bilateral hip pain particularly exacerbated lying on her side to sleep.  She has morning stiffness lasting about half an hour.  She has noticed some swelling and also some chronic enlargement in the joints of her fingers on both hands has required her to resize rings repeatedly due to the extent of joint enlargement.  She notices swelling sometimes at the base of the thumb and that distal index finger joint.  She apparently had previous evaluation of this including a mildly positive rheumatoid factor test in 2010 but does not recall any particular past treatments or extensive plan.  She uses Tylenol as needed occasionally she does not usually tolerate oral nonsteroidal anti-inflammatory medicines well due to GI upset.   Labs reviewed 2010 RF 20.1  Activities of Daily Living:  Patient reports morning stiffness for 30 minutes.   Patient Denies nocturnal pain.  Difficulty dressing/grooming: Denies Difficulty climbing stairs: Reports Difficulty getting out of chair: Denies Difficulty using hands for taps, buttons, cutlery, and/or writing: Reports  Review of Systems  Constitutional: Positive for fatigue.   HENT: Negative for mouth dryness.   Eyes: Negative for dryness.  Respiratory: Negative for shortness of breath.   Cardiovascular: Negative for swelling in legs/feet.  Gastrointestinal: Negative for constipation.  Endocrine: Negative for excessive thirst.  Genitourinary: Negative for difficulty urinating.  Musculoskeletal: Positive for arthralgias, joint pain, joint swelling and morning stiffness.  Skin: Negative for rash.  Allergic/Immunologic: Negative for susceptible to infections.  Neurological: Negative for numbness.  Hematological: Negative for bruising/bleeding tendency.  Psychiatric/Behavioral: Negative for sleep disturbance.    PMFS History:  Patient Active Problem List   Diagnosis Date Noted  . Rheumatoid factor positive 09/14/2020  . Cerumen impaction 07/13/2020  . Allergic contact dermatitis due to cosmetics 05/24/2019  . Edema of eyelid of both eyes 05/10/2019  . Dysuria 08/03/2018  . Skin lesion 05/25/2018  . Adult ADHD (attention deficit hyperactivity disorder) 12/11/2017  . Transient hypersomnia 12/11/2017  . Anxiety 06/09/2017  . Mild persistent asthma 12/03/2016  . Perennial and seasonal allergic rhinitis 10/04/2015  . Anemia 07/18/2015  . HLD (hyperlipidemia) 07/05/2014  . Major depressive disorder, single episode, in remission (HCC) 12/09/2013  . HTN (hypertension) 03/18/2012  . Osteoarthritis 02/17/2009  . POLYARTHRITIS 02/17/2009  . Osteopenia 02/17/2009    Past Medical History:  Diagnosis Date  . ADHD   . Asthma   . Head injury 08/04/2017  . Hypertension   . Memory difficulties 10/08/2017  . OA (osteoarthritis)   . Osteopenia     Family History  Problem Relation Age of  Onset  . Dementia Mother   . Rheum arthritis Mother   . Hypertension Father   . Allergic rhinitis Father   . Colon cancer Father   . Lung cancer Father   . Kidney failure Sister        s/p transplant  . Breast cancer Maternal Aunt 32       1/2 sister  . Angioedema Neg Hx    . Asthma Neg Hx   . Eczema Neg Hx   . Immunodeficiency Neg Hx   . Urticaria Neg Hx    History reviewed. No pertinent surgical history. Social History   Social History Narrative   Lives in [place] with husband.    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 Influenza Inj Mdck Quad Pf 12/12/2017  . Influenza Split 01/13/2013  . Influenza Whole 02/17/2009  . Influenza, Seasonal, Injecte, Preservative Fre 03/18/2012  . Influenza,inj,Quad PF,6+ Mos 12/09/2013, 03/15/2015, 12/25/2015, 01/15/2017, 02/10/2019  . PFIZER Comirnaty(Gray Top)Covid-19 Tri-Sucrose Vaccine 07/06/2019, 07/27/2019, 03/31/2020  . Td 02/17/2009  . Zoster Recombinat (Shingrix) 09/12/2017, 12/17/2017  . Zoster, Live 01/13/2013     Objective: Vital Signs: BP 131/73 (BP Location: Right Arm, Patient Position: Sitting, Cuff Size: Normal)   Pulse 73   Resp 16   Ht 5' (1.524 m)   Wt 114 lb (51.7 kg)   LMP 11/06/2012   BMI 22.26 kg/m    Physical Exam HENT:     Right Ear: External ear normal.     Left Ear: External ear normal.     Mouth/Throat:     Comments: Declines oral exam Eyes:     Conjunctiva/sclera: Conjunctivae normal.  Cardiovascular:     Rate and Rhythm: Normal rate and regular rhythm.  Pulmonary:     Effort: Pulmonary effort is normal.     Breath sounds: Normal breath sounds.  Skin:    General: Skin is warm and dry.     Findings: No rash.  Neurological:     General: No focal deficit present.      Mental Status: She is alert.  Psychiatric:        Mood and Affect: Mood normal.      Musculoskeletal Exam:  Shoulders full ROM no tenderness or swelling Elbows full ROM no tenderness or swelling Wrists full ROM no tenderness or swelling Bony enlargement of DIP joints on b/l hands, squaring of 1st CMC joint, right 2nd DIP swelling, left 1st CMC swelling Lateral hip tenderness to pressure at greater trochanteric head, mild tenderness over SI joint R>L Knees full ROM no tenderness or swelling, patellofemoral crepitus b/l Ankles full ROM no tenderness or swelling MTPs full ROM no tenderness or swelling    Investigation: No additional findings.  Imaging: XR HIPS BILAT W OR W/O PELVIS 2V  Result Date: 09/14/2020 X-ray pelvis and hips 4 views Femoral acetabular  joint shows mild degenerative change at right hip but with relative preservation of joint space bilaterally.  There is mild sclerotic changes on the iliac side of both SI joints with normal joint space.  Significant levoscoliosis is present in the visualized portion of lumbar spine. Impression Scoliosis, mild degenerative arthritis changes no erosions or demineralization  XR Hand 2 View Left  Result Date: 09/14/2020 X-ray left hand 2 views Radiocarpal joint space appears normal.  Advanced degenerative arthritis with moderate subluxation of the first [facility] joint.  Slight second and more advanced third MCP joint subluxation present without erosive change.  Some lateral osteophyte formation at third and fourth PIPs.  More advanced DIP joint space loss with sclerosis and osteophytes most progressed at the fourth DIP.  No erosive changes are seen.  Bone mineralization appears normal. Impression Degenerative arthritis changes advanced at the first [facility] and DIPs especially fourth digit  XR Hand 2 View Right  Result Date: 09/14/2020 X-ray right hand 2 views Radiocarpal joint space appears normal.  Increased sclerosis and degenerative change  at borders of the scaphoid and trapezium and moderate degenerative first [facility] joint changes.  Some partial subluxation at third MCP joint others appear preserved.  Normal PIP joint spaces DIP joints with extensive asymmetric space narrowing and sclerosis with lateral osteophyte formation.  No erosive changes are seen.  Bone mineralization appears normal. Impression Moderate degenerative arthritis worst at first [facility] and DIP joints without visible erosive changes   Recent Labs: Lab Results  Component Value Date   WBC 5.9 09/15/2019   HGB 13.3 09/15/2019   PLT 311.0 09/15/2019   NA 138 09/15/2019   K 5.0 09/15/2019   CL 102 09/15/2019   CO2 32 09/15/2019   GLUCOSE 84 09/15/2019   BUN 12 09/15/2019   CREATININE 0.74 09/15/2019   BILITOT 0.3 09/15/2019   ALKPHOS 95 09/15/2019   AST 15 09/15/2019   ALT 12 09/15/2019   PROT 7.0 09/15/2019   ALBUMIN 4.5 09/15/2019   CALCIUM 9.9 09/15/2019    Speciality Comments: No specialty comments available.  Procedures:  No procedures performed Allergies: Evekeo [amphetamine sulfate], Montelukast, and Singulair [montelukast sodium]   Assessment / Plan:     Visit Diagnoses: POLYARTHRITIS  Rheumatoid factor positive - Plan: Sedimentation rate, C-reactive protein, Rheumatoid factor, Cyclic citrul peptide antibody, IgG, Plan: XR Hand 2 View Right, XR Hand 2 View Left, XR HIPS BILAT W OR W/O PELVIS 2V  Joint pain in multiple areas no specific inflammatory changes seen on exam today.  She has previous history of mildly positive rheumatoid factor but no prior clinical diagnosis of RA is recorded.  We will recheck RF and also CCP antibody titers we will check inflammatory markers today.  We will obtain x-rays of bilateral hands for characterization the joint distribution seems more like OA or possible erosive OA given the episodic swelling.  Bilateral hip pain sounds more like lateral hip pain syndrome than true joint arthritis we will obtain plain films for  evaluation as well.  Osteoarthritis, unspecified osteoarthritis type, unspecified site  There is definitely some degenerative arthritis of joints in the bilateral hands discussed some treatment options she does not tolerate oral NSAIDs well which is a limitation.  Has not previously done any occupational therapy, has not tried topical NSAIDs, she is not super interested in aggressive treatment such as injections currently.   Orders: Orders Placed This Encounter  Procedures  . XR Hand 2 View Right  . XR Hand 2 View Left  .  XR HIPS BILAT W OR W/O PELVIS 2V  . Sedimentation rate  . C-reactive protein  . Rheumatoid factor  . Cyclic citrul peptide antibody, IgG   No orders of the defined types were placed in this encounter.    Follow-Up Instructions: Return in about 2 weeks (around 09/28/2020) for New pt f/u arthritis, possible RA.   Fuller Plan, MD  Note - This record has been created using [employer].  Chart creation errors have been sought, but may not always  have been located. Such creation errors do not reflect on  the standard of medical care.

## 2020-09-14 ENCOUNTER — Ambulatory Visit: Payer: Self-pay

## 2020-09-14 ENCOUNTER — Encounter: Payer: Self-pay | Admitting: Internal Medicine

## 2020-09-14 ENCOUNTER — Ambulatory Visit: Payer: 59 | Admitting: Internal Medicine

## 2020-09-14 ENCOUNTER — Other Ambulatory Visit: Payer: Self-pay

## 2020-09-14 VITALS — BP 131/73 | HR 73 | Resp 16 | Ht 60.0 in | Wt 114.0 lb

## 2020-09-14 DIAGNOSIS — M25552 Pain in left hip: Secondary | ICD-10-CM

## 2020-09-14 DIAGNOSIS — M79641 Pain in right hand: Secondary | ICD-10-CM | POA: Diagnosis not present

## 2020-09-14 DIAGNOSIS — M1990 Unspecified osteoarthritis, unspecified site: Secondary | ICD-10-CM | POA: Diagnosis not present

## 2020-09-14 DIAGNOSIS — M79642 Pain in left hand: Secondary | ICD-10-CM

## 2020-09-14 DIAGNOSIS — F909 Attention-deficit hyperactivity disorder, unspecified type: Secondary | ICD-10-CM

## 2020-09-14 DIAGNOSIS — M2550 Pain in unspecified joint: Secondary | ICD-10-CM

## 2020-09-14 DIAGNOSIS — M8589 Other specified disorders of bone density and structure, multiple sites: Secondary | ICD-10-CM

## 2020-09-14 DIAGNOSIS — M25551 Pain in right hip: Secondary | ICD-10-CM | POA: Diagnosis not present

## 2020-09-14 DIAGNOSIS — R768 Other specified abnormal immunological findings in serum: Secondary | ICD-10-CM | POA: Diagnosis not present

## 2020-09-15 LAB — C-REACTIVE PROTEIN: CRP: 1.3 mg/L (ref <8.0)

## 2020-09-15 LAB — SEDIMENTATION RATE: Sed Rate: 6 mm/h (ref 0–30)

## 2020-09-15 LAB — CYCLIC CITRUL PEPTIDE ANTIBODY, IGG: Cyclic Citrullin Peptide Ab: <16 UNITS

## 2020-09-15 LAB — RHEUMATOID FACTOR: Rheumatoid fact SerPl-aCnc: <14 IU/mL (ref <14)

## 2020-10-03 ENCOUNTER — Ambulatory Visit: Payer: 59 | Admitting: Internal Medicine

## 2020-10-04 ENCOUNTER — Other Ambulatory Visit: Payer: Self-pay

## 2020-10-04 DIAGNOSIS — F32A Depression, unspecified: Secondary | ICD-10-CM

## 2020-10-04 MED ORDER — VENLAFAXINE HCL ER 150 MG PO CP24: 150.0000 mg | ORAL_CAPSULE | Freq: Every day | ORAL | 0 refills | Status: DC

## 2020-10-09 ENCOUNTER — Other Ambulatory Visit: Payer: Self-pay | Admitting: Family Medicine

## 2020-10-09 ENCOUNTER — Encounter: Payer: Self-pay | Admitting: Family Medicine

## 2020-10-09 DIAGNOSIS — I10 Essential (primary) hypertension: Secondary | ICD-10-CM

## 2020-10-09 DIAGNOSIS — F32A Depression, unspecified: Secondary | ICD-10-CM

## 2020-10-09 MED ORDER — HYDROCHLOROTHIAZIDE 12.5 MG PO TABS: 12.5000 mg | ORAL_TABLET | Freq: Every day | ORAL | 3 refills | Status: DC

## 2020-10-09 MED ORDER — BENAZEPRIL HCL 10 MG PO TABS
10.0000 mg | ORAL_TABLET | Freq: Every day | ORAL | 3 refills | Status: DC
Start: 2020-10-09 — End: 2021-10-29

## 2020-10-12 ENCOUNTER — Ambulatory Visit: Payer: 59 | Admitting: Rheumatology

## 2020-10-22 NOTE — Progress Notes (Signed)
Office Visit Note  Patient: Tricia Munoz             Date of Birth: 1960/05/26           MRN: [id number]             PCP: Lynnda Child, MD Referring: Lynnda Child, MD Visit Date: 10/23/2020   Subjective:  Follow-up (Patient complains of continued bilateral hand/wrist, bilateral hip, and bilateral foot pain. )   History of Present Illness: Tricia Munoz is a 60 y.o. female here for follow up for multiple chronic joint pains wit workup for RA at initial visit with negative RA antibodies and inflammatory markers. Xrays of bilateral hands were obtained showing significant degenerative arthritis no inflammatory disease changes.  Symptoms remain unchanged since our last visit.  Previous HPI: 09/14/20 Tricia Munoz is a 60 y.o. female here for evaluation of polyarthralgia with bony changes noted in bilateral hands and has had previously elevated rheumatoid factor test since 2010.  She has had some degree of chronic joint pain involving both hands, both wrists, both hips, and both feet but these have been significantly increased recently especially since around November of last year.  Her biggest complaint today is of bilateral hip pain particularly exacerbated lying on her side to sleep.  She has morning stiffness lasting about half an hour.  She has noticed some swelling and also some chronic enlargement in the joints of her fingers on both hands has required her to resize rings repeatedly due to the extent of joint enlargement.  She notices swelling sometimes at the base of the thumb and that distal index finger joint.  She apparently had previous evaluation of this including a mildly positive rheumatoid factor test in 2010 but does not recall any particular past treatments or extensive plan.  She uses Tylenol as needed occasionally she does not usually tolerate oral nonsteroidal anti-inflammatory medicines well due to GI upset.   Labs reviewed 2010 RF 20.1   Review of Systems   Constitutional:  Negative for fatigue.  HENT:  Negative for mouth sores, mouth dryness and nose dryness.   Eyes:  Negative for pain, itching, visual disturbance and dryness.  Respiratory:  Negative for cough, hemoptysis, shortness of breath and difficulty breathing.   Cardiovascular:  Negative for chest pain, palpitations and swelling in legs/feet.  Gastrointestinal:  Negative for abdominal pain, blood in stool, constipation and diarrhea.  Endocrine: Negative for increased urination.  Genitourinary:  Negative for painful urination.  Musculoskeletal:  Positive for joint pain, joint pain, joint swelling and morning stiffness. Negative for myalgias, muscle weakness, muscle tenderness and myalgias.  Skin:  Negative for color change, rash and redness.  Allergic/Immunologic: Negative for susceptible to infections.  Neurological:  Negative for dizziness, numbness, headaches, memory loss and weakness.  Hematological:  Negative for swollen glands.  Psychiatric/Behavioral:  Negative for confusion and sleep disturbance.    PMFS History:  Patient Active Problem List   Diagnosis Date Noted   Rheumatoid factor positive 09/14/2020   Cerumen impaction 07/13/2020   Allergic contact dermatitis due to cosmetics 05/24/2019   Edema of eyelid of both eyes 05/10/2019   Dysuria 08/03/2018   Skin lesion 05/25/2018   Adult ADHD (attention deficit hyperactivity disorder) 12/11/2017   Transient hypersomnia 12/11/2017   Anxiety 06/09/2017   Mild persistent asthma 12/03/2016   Perennial and seasonal allergic rhinitis 10/04/2015   Anemia 07/18/2015   HLD (hyperlipidemia) 07/05/2014   Major depressive disorder, single episode, in  remission (HCC) 12/09/2013   HTN (hypertension) 03/18/2012   Osteoarthritis 02/17/2009   POLYARTHRITIS 02/17/2009   Osteopenia 02/17/2009    Past Medical History:  Diagnosis Date   ADHD    Asthma    Head injury 08/04/2017   Hypertension    Memory difficulties 10/08/2017   OA  (osteoarthritis)    Osteopenia     Family History  Problem Relation Age of Onset   Dementia Mother    Rheum arthritis Mother    Hypertension Father    Allergic rhinitis Father    Colon cancer Father    Lung cancer Father    Kidney failure Sister        s/p transplant   Breast cancer Maternal Aunt 8       1/2 sister   Angioedema Neg Hx    Asthma Neg Hx    Eczema Neg Hx    Immunodeficiency Neg Hx    Urticaria Neg Hx    History reviewed. No pertinent surgical history. Social History   Social History Narrative   Lives in [place] with husband.    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 [employer]      Family: [name]       Enjoys: cooking, Archivist, crafts, reading, hiking      Exercise: walking - active throughout the day   Diet: generally healthy, not a big meat eater      Safety   Seat belts: Yes    Guns: Yes  and secure   Safe in relationships: Yes    Immunization History  Administered Date(s) Administered   Influenza Inj Mdck Quad Pf 12/12/2017   Influenza Split 01/13/2013   Influenza Whole 02/17/2009   Influenza, Seasonal, Injecte, Preservative Fre 03/18/2012   Influenza,inj,Quad PF,6+ Mos 12/09/2013, 03/15/2015, 12/25/2015, 01/15/2017, 02/10/2019   PFIZER Comirnaty(Gray Top)Covid-19 Tri-Sucrose Vaccine 07/06/2019, 07/27/2019, 03/31/2020   Td 02/17/2009   Zoster Recombinat (Shingrix) 09/12/2017, 12/17/2017   Zoster, Live 01/13/2013     Objective: Vital Signs: BP 118/68 (BP Location: Left Arm, Patient Position: Sitting, Cuff Size: Normal)   Pulse 82   Ht 5' (1.524 m)   Wt 114 lb (51.7 kg)   LMP 11/06/2012   BMI 22.26 kg/m    Physical Exam Cardiovascular:     Rate and Rhythm: Normal rate and regular rhythm.  Pulmonary:     Effort: Pulmonary effort is normal.     Breath sounds: Normal breath  sounds.  Skin:    General: Skin is warm and dry.     Findings: No rash.  Neurological:     Mental Status: She is alert.  Psychiatric:        Mood and Affect: Mood normal.     Musculoskeletal Exam:  Elbows full ROM no tenderness or swelling Wrists full ROM no tenderness or swelling Bony enlargement of DIP joints on b/l hands, squaring of 1st CMC joint, no palpable synovitis Lateral hip tenderness to pressure at greater trochanteric head, mild tenderness over SI joint R>L Knees full ROM no tenderness or swelling, patellofemoral crepitus b/l Ankles full ROM no tenderness or swelling  Investigation: No additional findings.  Imaging: No results found.  Recent Labs: Lab Results  Component Value Date   WBC 5.9 09/15/2019   HGB 13.3 09/15/2019   PLT 311.0  09/15/2019   NA 138 09/15/2019   K 5.0 09/15/2019   CL 102 09/15/2019   CO2 32 09/15/2019   GLUCOSE 84 09/15/2019   BUN 12 09/15/2019   CREATININE 0.74 09/15/2019   BILITOT 0.3 09/15/2019   ALKPHOS 95 09/15/2019   AST 15 09/15/2019   ALT 12 09/15/2019   PROT 7.0 09/15/2019   ALBUMIN 4.5 09/15/2019   CALCIUM 9.9 09/15/2019    Speciality Comments: No specialty comments available.  Procedures:  No procedures performed Allergies: Evekeo [amphetamine sulfate], Montelukast, and Singulair [montelukast sodium]   Assessment / Plan:     Visit Diagnoses: Rheumatoid factor positive  Positive rheumatoid factor low titer but she does not demonstrate typical inflammatory joint changes or distribution and and x-rays reviewed are consistent with extensive osteoarthritis.  No DMARD treatment recommended at this time no additional work-up can follow-up as needed.  POLYARTHRITIS Osteoarthritis, unspecified osteoarthritis type, unspecified site - Plan: Ambulatory referral to Occupational Therapy  Generalized osteoarthritis with increase in joint symptoms she is concerned about symptom management and progression will refer to  occupational therapy for joint protection strategy or therapeutic exercises.  Orders: Orders Placed This Encounter  Procedures   Ambulatory referral to Occupational Therapy    No orders of the defined types were placed in this encounter.   Follow-Up Instructions: Return if symptoms worsen or fail to improve.   Fuller Plan, MD  Note - This record has been created using [employer].  Chart creation errors have been sought, but may not always  have been located. Such creation errors do not reflect on  the standard of medical care.

## 2020-10-23 ENCOUNTER — Ambulatory Visit: Payer: 59 | Admitting: Internal Medicine

## 2020-10-23 ENCOUNTER — Other Ambulatory Visit: Payer: Self-pay

## 2020-10-23 ENCOUNTER — Encounter: Payer: Self-pay | Admitting: Internal Medicine

## 2020-10-23 VITALS — BP 118/68 | HR 82 | Ht 60.0 in | Wt 114.0 lb

## 2020-10-23 DIAGNOSIS — R768 Other specified abnormal immunological findings in serum: Secondary | ICD-10-CM

## 2020-10-23 DIAGNOSIS — M1990 Unspecified osteoarthritis, unspecified site: Secondary | ICD-10-CM

## 2020-10-23 DIAGNOSIS — M2550 Pain in unspecified joint: Secondary | ICD-10-CM

## 2020-12-12 ENCOUNTER — Encounter: Payer: Self-pay | Admitting: Radiology

## 2021-01-30 ENCOUNTER — Ambulatory Visit (INDEPENDENT_AMBULATORY_CARE_PROVIDER_SITE_OTHER): Payer: 59 | Admitting: Family Medicine

## 2021-01-30 ENCOUNTER — Telehealth: Payer: Self-pay | Admitting: Family Medicine

## 2021-01-30 ENCOUNTER — Other Ambulatory Visit: Payer: Self-pay

## 2021-01-30 VITALS — BP 118/80 | HR 76 | Temp 97.0°F | Ht 60.0 in | Wt 109.1 lb

## 2021-01-30 DIAGNOSIS — L723 Sebaceous cyst: Secondary | ICD-10-CM | POA: Diagnosis not present

## 2021-01-30 DIAGNOSIS — L089 Local infection of the skin and subcutaneous tissue, unspecified: Secondary | ICD-10-CM | POA: Insufficient documentation

## 2021-01-30 DIAGNOSIS — Z23 Encounter for immunization: Secondary | ICD-10-CM | POA: Diagnosis not present

## 2021-01-30 MED ORDER — MUPIROCIN CALCIUM 2 % EX CREA
1.0000 | TOPICAL_CREAM | Freq: Two times a day (BID) | CUTANEOUS | 0 refills | Status: DC
Start: 2021-01-30 — End: 2021-01-30

## 2021-01-30 MED ORDER — CEPHALEXIN 500 MG PO CAPS
500.0000 mg | ORAL_CAPSULE | Freq: Two times a day (BID) | ORAL | 0 refills | Status: AC
Start: 2021-01-30 — End: 2021-02-06

## 2021-01-30 MED ORDER — MUPIROCIN 2 % EX OINT: 1.0000 "application " | TOPICAL_OINTMENT | Freq: Two times a day (BID) | CUTANEOUS | 0 refills | Status: DC

## 2021-01-30 NOTE — Telephone Encounter (Signed)
Notified pharmacy that it is OK to switch.  Sent in ointment to pharmacy to fill in place of the cream

## 2021-01-30 NOTE — Patient Instructions (Signed)
Sebacceous cyst - warm compress or soak in warm water for 5 minutes twice daily - use topical ointment - if worsening redness, fever, chills, or no improvement - ok to start antibiotics  Update next week if no improvement and try to expedite the referral to podiatry but redness resolves and just the cyst is present no need to rush for removal.

## 2021-01-30 NOTE — Assessment & Plan Note (Signed)
Treat with topical first. If no improvement oral abx. Referral to podiatry for removal given location and access at joint space

## 2021-01-30 NOTE — Telephone Encounter (Signed)
Marivi from [facility] pharmacy is stating that mupirocin is not covered by pt insurance but most insurance will cover the ointment bactroban. Wants to know if it can be switched

## 2021-01-30 NOTE — Progress Notes (Signed)
   Subjective:     Tricia Munoz is a 60 y.o. female presenting for Cyst (R foot x 2 weeks. Has drained several times but keeps filling up. Very red and painful. )     HPI  #Cyst - right foot - for 2 weeks - has drained several times and used alcohol  - thought it was a boil  - yesterday pain returned and it was coming back - rubbing and it would drain  Was recently on amoxicillin   Review of Systems   Social History   Tobacco Use  Smoking Status Never  Smokeless Tobacco Never        Objective:    BP Readings from Last 3 Encounters:  01/30/21 118/80  10/23/20 118/68  09/14/20 131/73   Wt Readings from Last 3 Encounters:  01/30/21 109 lb 2 oz (49.5 kg)  10/23/20 114 lb (51.7 kg)  09/14/20 114 lb (51.7 kg)    BP 118/80   Pulse 76   Temp (!) 97 F (36.1 C) (Temporal)   Ht 5' (1.524 m)   Wt 109 lb 2 oz (49.5 kg)   LMP 11/06/2012   SpO2 97%   BMI 21.31 kg/m    Physical Exam Constitutional:      General: She is not in acute distress.    Appearance: She is well-developed. She is not diaphoretic.  HENT:     Right Ear: External ear normal.     Left Ear: External ear normal.  Eyes:     Conjunctiva/sclera: Conjunctivae normal.  Cardiovascular:     Rate and Rhythm: Normal rate.  Pulmonary:     Effort: Pulmonary effort is normal.  Musculoskeletal:     Cervical back: Neck supple.  Skin:    General: Skin is warm and dry.     Capillary Refill: Capillary refill takes less than 2 seconds.     Comments: Right foot - between the 4th and 5th digit on the top of the foot is a firm nodule with erythema and mild ttp. Non-mobile. Non-fluctuant  Neurological:     Mental Status: She is alert. Mental status is at baseline.  Psychiatric:        Mood and Affect: Mood normal.        Behavior: Behavior normal.          Assessment & Plan:   Problem List Items Addressed This Visit       Musculoskeletal and Integument   Infected sebaceous cyst of skin -  Primary    Treat with topical first. If no improvement oral abx. Referral to podiatry for removal given location and access at joint space      Relevant Medications   mupirocin cream (BACTROBAN) 2 %   cephALEXin (KEFLEX) 500 MG capsule   Other Relevant Orders   Ambulatory referral to Podiatry   Other Visit Diagnoses     Need for influenza vaccination       Relevant Orders   Flu Vaccine QUAD 92mo+IM (Fluarix, Fluzone & Alfiuria Quad PF) (Completed)        Return if symptoms worsen or fail to improve.  Lynnda Child, MD  This visit occurred during the SARS-CoV-2 public health emergency.  Safety protocols were in place, including screening questions prior to the visit, additional usage of staff PPE, and extensive cleaning of exam room while observing appropriate contact time as indicated for disinfecting solutions.

## 2021-02-09 ENCOUNTER — Other Ambulatory Visit: Payer: Self-pay | Admitting: Family Medicine

## 2021-02-09 DIAGNOSIS — Z1231 Encounter for screening mammogram for malignant neoplasm of breast: Secondary | ICD-10-CM

## 2021-02-12 ENCOUNTER — Ambulatory Visit: Payer: 59 | Admitting: Podiatry

## 2021-02-14 ENCOUNTER — Other Ambulatory Visit: Payer: Self-pay

## 2021-02-14 ENCOUNTER — Ambulatory Visit: Payer: 59 | Admitting: Podiatry

## 2021-02-14 ENCOUNTER — Encounter: Payer: Self-pay | Admitting: Podiatry

## 2021-02-14 DIAGNOSIS — L02611 Cutaneous abscess of right foot: Secondary | ICD-10-CM | POA: Diagnosis not present

## 2021-02-14 DIAGNOSIS — B353 Tinea pedis: Secondary | ICD-10-CM

## 2021-02-14 MED ORDER — FLUCONAZOLE 150 MG PO TABS: 150.0000 mg | ORAL_TABLET | Freq: Once | ORAL | 0 refills | Status: AC

## 2021-02-14 MED ORDER — CEPHALEXIN 500 MG PO CAPS: 500.0000 mg | ORAL_CAPSULE | Freq: Three times a day (TID) | ORAL | 0 refills | Status: DC

## 2021-02-14 MED ORDER — KETOCONAZOLE 2 % EX CREA: 1.0000 "application " | TOPICAL_CREAM | Freq: Every day | CUTANEOUS | 2 refills | Status: DC

## 2021-02-14 MED ORDER — FLUCONAZOLE 150 MG PO TABS: 150.0000 mg | ORAL_TABLET | Freq: Every day | ORAL | 0 refills | Status: DC

## 2021-02-18 NOTE — Progress Notes (Signed)
  Subjective:  Patient ID: Tricia Munoz, female    DOB: 1961-04-11,  MRN: [id number]  Chief Complaint  Patient presents with   cyst    New pt-Cyst on Right foot between baby toe on top of foot/lft msg 02/11/2021    60 y.o. female presents with the above complaint. History confirmed with patient.   Objective:  Physical Exam: warm, good capillary refill, no trophic changes or ulcerative lesions, normal DP and PT pulses, and normal sensory exam.  Right Foot: Interdigital tinea pedis in the fifth interspace with a healing abscess dorsal to this in the webspace Assessment:   1. Tinea pedis of right foot   2. Abscess of right foot      Plan:  Patient was evaluated and treated and all questions answered.  Suspect this is a resolving abscess secondary to a bacterial superinfection on pre-existing tinea pedis interdigitally.  Recommended topical and oral treatment.  I prescribed her Keflex and ketoconazole, also gave her a prescription for Diflucan as needed for yeast infections.  She will follow-up in 2 weeks  Return in about 2 weeks (around 02/28/2021) for check tinea / abscess right foot .

## 2021-02-26 ENCOUNTER — Other Ambulatory Visit: Payer: Self-pay

## 2021-02-26 ENCOUNTER — Ambulatory Visit: Payer: 59 | Admitting: Podiatry

## 2021-02-26 DIAGNOSIS — L02611 Cutaneous abscess of right foot: Secondary | ICD-10-CM | POA: Diagnosis not present

## 2021-02-26 MED ORDER — DOXYCYCLINE HYCLATE 100 MG PO TABS: 100.0000 mg | ORAL_TABLET | Freq: Two times a day (BID) | ORAL | 0 refills | Status: DC

## 2021-02-26 MED ORDER — FLUCONAZOLE 150 MG PO TABS: 150.0000 mg | ORAL_TABLET | Freq: Once | ORAL | 0 refills | Status: AC

## 2021-02-26 NOTE — Patient Instructions (Signed)

## 2021-02-27 NOTE — Progress Notes (Signed)
  Subjective:  Patient ID: Tricia Munoz, female    DOB: Aug 19, 1960,  MRN: [id number]  Chief Complaint  Patient presents with   Foot Ulcer    Abscess right foot    60 y.o. female presents with the above complaint. History confirmed with patient.  No better than it was after last visit it came to a head and start draining again  Objective:  Physical Exam: warm, good capillary refill, no trophic changes or ulcerative lesions, normal DP and PT pulses, and normal sensory exam.  Right Foot: Interdigital tinea pedis in the fifth interspace with a small draining abscess dorsal Assessment:   1. Abscess of right foot      Plan:  Patient was evaluated and treated and all questions answered.  Has not improved much and now has a draining abscess on the top of the foot.  I recommend incision and drainage.  Following local field block with Marcaine and sterile prep with Betadine utilizing a 15 blade and curette I performed an I&D and open the abscess was very superficial and minimal purulence was found.  Took a culture of the drainage.  Switch to doxycycline, continue ketoconazole and mupirocin ointment.  Return in about 2 weeks (around 03/12/2021) for f/u abscess.

## 2021-03-12 ENCOUNTER — Ambulatory Visit: Payer: 59 | Admitting: Podiatry

## 2021-03-12 ENCOUNTER — Encounter: Payer: Self-pay | Admitting: Podiatry

## 2021-03-12 ENCOUNTER — Other Ambulatory Visit: Payer: Self-pay

## 2021-03-12 DIAGNOSIS — L02611 Cutaneous abscess of right foot: Secondary | ICD-10-CM

## 2021-03-12 NOTE — Progress Notes (Signed)
  Subjective:  Patient ID: Tricia Munoz, female    DOB: 07-16-1960,  MRN: [id number]  Chief Complaint  Patient presents with   Foot Pain    "It's not well.  It looks like it has come back."    60 y.o. female returns for follow-up with the above complaint. History confirmed with patient.  Still has not improved or healed up completely  Objective:  Physical Exam: warm, good capillary refill, no trophic changes or ulcerative lesions, normal DP and PT pulses, and normal sensory exam.  Right Foot: Interdigital tinea pedis has now improved, dorsal to this still is a small hard draining lesion   Wound culture with no growth 02/26/2021 Assessment:   1. Abscess of right foot      Plan:  Patient was evaluated and treated and all questions answered.  Unfortunately this still has not improved.  The area still has a draining cystic/abscess area.  It is possible this is a mucoid cyst extending from the MTPJ.  I think we should evaluate further with advanced imaging with an MRI to evaluate further for surgical excision more extensive drainage in the operating suite.  She will return to see me after the MRI.  Stat MRI has been ordered from [facility]  Return for after MRI to review.

## 2021-03-16 ENCOUNTER — Ambulatory Visit
Admission: RE | Admit: 2021-03-16 | Discharge: 2021-03-16 | Disposition: A | Discharge status: Home / Self Care | Payer: 59 | Source: Ambulatory Visit | Attending: Family Medicine | Admitting: Family Medicine

## 2021-03-16 DIAGNOSIS — Z1231 Encounter for screening mammogram for malignant neoplasm of breast: Secondary | ICD-10-CM

## 2021-03-25 ENCOUNTER — Other Ambulatory Visit: Payer: Self-pay | Admitting: Family Medicine

## 2021-03-25 DIAGNOSIS — F32A Depression, unspecified: Secondary | ICD-10-CM

## 2021-04-06 LAB — WOUND CULTURE

## 2021-04-23 ENCOUNTER — Encounter: Payer: Self-pay | Admitting: Podiatry

## 2021-04-23 ENCOUNTER — Ambulatory Visit
Admission: RE | Admit: 2021-04-23 | Discharge: 2021-04-23 | Disposition: A | Discharge status: Home / Self Care | Payer: 59 | Source: Ambulatory Visit | Attending: Podiatry | Admitting: Podiatry

## 2021-04-23 ENCOUNTER — Other Ambulatory Visit: Payer: Self-pay

## 2021-04-23 DIAGNOSIS — R600 Localized edema: Secondary | ICD-10-CM | POA: Diagnosis not present

## 2021-04-23 DIAGNOSIS — L02611 Cutaneous abscess of right foot: Secondary | ICD-10-CM

## 2021-04-23 DIAGNOSIS — L03115 Cellulitis of right lower limb: Secondary | ICD-10-CM | POA: Diagnosis not present

## 2021-04-23 MED ORDER — GADOBENATE DIMEGLUMINE 529 MG/ML IV SOLN
10.0000 mL | Freq: Once | INTRAVENOUS | Status: AC | PRN
  Administered 2021-04-23: 10 mL via INTRAVENOUS

## 2021-04-24 ENCOUNTER — Telehealth: Payer: Self-pay

## 2021-04-24 NOTE — Telephone Encounter (Signed)
I sent her a message about it earlier on MyChart. Can you have her scheduled to come see me tomorrow? I think she needs an I&D in the OR

## 2021-04-24 NOTE — Telephone Encounter (Signed)
Scheduled pt an appt in the am

## 2021-04-24 NOTE — Telephone Encounter (Signed)
Patient called regarding the results of her MRI and was wanting to know what the next step was.  Please advise

## 2021-04-25 ENCOUNTER — Encounter: Payer: Self-pay | Admitting: Podiatry

## 2021-04-25 ENCOUNTER — Ambulatory Visit (INDEPENDENT_AMBULATORY_CARE_PROVIDER_SITE_OTHER): Payer: 59

## 2021-04-25 ENCOUNTER — Telehealth: Payer: Self-pay | Admitting: Urology

## 2021-04-25 ENCOUNTER — Ambulatory Visit (INDEPENDENT_AMBULATORY_CARE_PROVIDER_SITE_OTHER): Payer: 59 | Admitting: Podiatry

## 2021-04-25 ENCOUNTER — Other Ambulatory Visit: Payer: Self-pay

## 2021-04-25 DIAGNOSIS — L02611 Cutaneous abscess of right foot: Secondary | ICD-10-CM

## 2021-04-25 NOTE — Telephone Encounter (Signed)
DOS - 04/27/21  WEBBING PROCEDURE RIGHT --- 96759  [employer] EFFECTIVE DATE - 04/15/21   SPOKE WITH CHRIS WITH [employer] AND HE STATED THAT FOR CPT CODE 16384 NO PRIOR AUTH IS REQUIRED.  REF # [id number]

## 2021-04-25 NOTE — Progress Notes (Signed)
Subjective:  Patient ID: Tricia Munoz, female    DOB: 04-23-1960,  MRN: [id number]  Chief Complaint  Patient presents with   Wound Check    "It busted last night so it feels better today"  Here to discuss MRI results    61 y.o. female returns for follow-up with the above complaint. History confirmed with patient.  Filled out and popped again last night.  She completed the MRI.  Objective:  Physical Exam: warm, good capillary refill, no trophic changes or ulcerative lesions, normal DP and PT pulses, and normal sensory exam.  Right Foot: Interdigital tinea pedis has now improved, dorsal to this still is a small hard draining lesion   Wound culture with no growth 02/26/2021  Study Result  Narrative & Impression  CLINICAL DATA:  Inflammation between the fourth and fifth toes for months.   EXAM: MRI OF THE RIGHT TOES WITHOUT AND WITH CONTRAST   TECHNIQUE: Multiplanar, multisequence MR imaging of the right foot was performed both before and after administration of intravenous contrast.   CONTRAST:  12mL MULTIHANCE GADOBENATE DIMEGLUMINE 529 MG/ML IV SOLN   COMPARISON:  None.   FINDINGS: Bones/Joint/Cartilage   No fracture or dislocation. Normal alignment. No joint effusion. No marrow signal abnormality. 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   Ligaments   Collateral ligaments are intact.  Lisfranc ligament is intact.   Muscles and Tendons   Flexor, peroneal and extensor compartment tendons are intact. Muscles are normal.   Soft tissue Soft tissue edema along the dorsal lateral aspect of the forefoot at the level of the fourth and fifth MTP joints concerning for cellulitis. 7 x 7 x 8 mm complex fluid collection with peripheral enhancement along the dorsal soft tissues between the fourth and fifth MTP joints concerning for a small abscess. No soft tissue mass.   IMPRESSION: 1. Cellulitis of  the dorsal lateral aspect of the forefoot. 7 x 7 x 8 mm abscess along the dorsal soft tissues between the fourth and fifth MTP joints. 2. Mild osteoarthritis of the first MTP joint.     Electronically Signed   By: Elige Ko M.D.   On: 04/24/2021 08:52   Assessment:   1. Abscess of right foot      Plan:  Patient was evaluated and treated and all questions answered.  We again reviewed further treatment options.  I think further I&D in the office likely is not to yield positive results.  I think we should proceed with a excision of the wedge of skin as well as I&D in the operating room and excised the interposition of the skin and syndactylize partially the fourth and fifth toes to remove the heloma molle.  Discussed the risk benefits and potential complications of this including not limited to pain, swelling, infection, scar, numbness which may be temporary or permanent, chronic pain, stiffness, nerve pain or damage, wound healing problems.  Also discussed the possibility of possible bone removal to prevent recurrence of the heloma molle.  She understands and wishes to proceed.  Informed consent was signed and reviewed.  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

## 2021-04-25 NOTE — Telephone Encounter (Signed)
Addressed in office today

## 2021-04-25 NOTE — Telephone Encounter (Signed)
Please advise 

## 2021-04-27 ENCOUNTER — Other Ambulatory Visit: Payer: Self-pay | Admitting: Podiatry

## 2021-04-27 ENCOUNTER — Encounter: Payer: Self-pay | Admitting: Podiatry

## 2021-04-27 DIAGNOSIS — M2041 Other hammer toe(s) (acquired), right foot: Secondary | ICD-10-CM

## 2021-04-27 DIAGNOSIS — L02611 Cutaneous abscess of right foot: Secondary | ICD-10-CM | POA: Diagnosis not present

## 2021-04-27 MED ORDER — IBUPROFEN 600 MG PO TABS: 600.0000 mg | ORAL_TABLET | Freq: Four times a day (QID) | ORAL | 0 refills | Status: AC | PRN

## 2021-04-27 MED ORDER — OXYCODONE HCL 5 MG PO TABS: 5.0000 mg | ORAL_TABLET | ORAL | 0 refills | Status: AC | PRN

## 2021-04-27 MED ORDER — CEPHALEXIN 500 MG PO CAPS: 500.0000 mg | ORAL_CAPSULE | Freq: Three times a day (TID) | ORAL | 0 refills | Status: AC

## 2021-04-27 MED ORDER — ACETAMINOPHEN 500 MG PO TABS: 1000.0000 mg | ORAL_TABLET | Freq: Four times a day (QID) | ORAL | 0 refills | Status: AC | PRN

## 2021-04-27 NOTE — Progress Notes (Signed)
04/27/21 right foot partial syndactylyzation

## 2021-05-02 ENCOUNTER — Ambulatory Visit (INDEPENDENT_AMBULATORY_CARE_PROVIDER_SITE_OTHER): Payer: 59 | Admitting: Podiatry

## 2021-05-02 ENCOUNTER — Other Ambulatory Visit: Payer: Self-pay

## 2021-05-02 DIAGNOSIS — B353 Tinea pedis: Secondary | ICD-10-CM

## 2021-05-02 DIAGNOSIS — L02611 Cutaneous abscess of right foot: Secondary | ICD-10-CM

## 2021-05-03 NOTE — Progress Notes (Signed)
°  Subjective:  Patient ID: Tricia Munoz, female    DOB: [birth year],  MRN: [id number]  Chief Complaint  Patient presents with   Routine Post Op     POV #1 DOS 04/27/2021 EXCISION OF SKIN & POSTERAL WEBBING OF 4TH/5TH TOES, POSSIBLE BONE REMOVAL    DOS: 04/27/21 Procedure: partial syndactylization right foot  61 y.o. female returns for post-op check. Doing well not having much pain   Review of Systems: Negative except as noted in the HPI. Denies N/V/F/Ch.   Objective:  There were no vitals filed for this visit. There is no height or weight on file to calculate BMI. Constitutional Well developed. Well nourished.  Vascular Foot warm and well perfused. Capillary refill normal to all digits.  Calf is soft and supple, no posterior calf or knee pain, negative Homans' sign  Neurologic Normal speech. Oriented to person, place, and time. Epicritic sensation to light touch grossly present bilaterally.  Dermatologic Skin healing well without signs of infection. Skin edges well coapted without signs of infection.  Orthopedic: Tenderness to palpation noted about the surgical site.     Assessment:   1. Abscess of right foot   2. Tinea pedis of right foot    Plan:  Patient was evaluated and treated and all questions answered.  S/p foot surgery right -Progressing as expected post-operatively. -WB Status: wbat in sx shoe -Sutures: remove in 2-3 weeks. -Medications: no refills required, complete antibiotics -Foot redressed.  Return in about 19 days (around 05/21/2021) for suture removal.

## 2021-05-10 NOTE — Progress Notes (Signed)
DOS 04/27/2021 Excision of skin and partial webbing fourth and fifth toes right foot

## 2021-05-21 ENCOUNTER — Other Ambulatory Visit: Payer: Self-pay

## 2021-05-21 ENCOUNTER — Ambulatory Visit (INDEPENDENT_AMBULATORY_CARE_PROVIDER_SITE_OTHER): Payer: 59 | Admitting: Podiatry

## 2021-05-21 ENCOUNTER — Encounter: Payer: Self-pay | Admitting: Podiatry

## 2021-05-21 DIAGNOSIS — L02611 Cutaneous abscess of right foot: Secondary | ICD-10-CM

## 2021-05-22 NOTE — Progress Notes (Signed)
°  Subjective:  Patient ID: Tricia Munoz, female    DOB: [birth year],  MRN: [id number]  Chief Complaint  Patient presents with   Routine Post Op    "I'm doing well."    DOS: 04/27/21 Procedure: partial syndactylization right foot  61 y.o. female returns for post-op check. Doing well   Review of Systems: Negative except as noted in the HPI. Denies N/V/F/Ch.   Objective:  There were no vitals filed for this visit. There is no height or weight on file to calculate BMI. Constitutional Well developed. Well nourished.  Vascular Foot warm and well perfused. Capillary refill normal to all digits.  Calf is soft and supple, no posterior calf or knee pain, negative Homans' sign  Neurologic Normal speech. Oriented to person, place, and time. Epicritic sensation to light touch grossly present bilaterally.  Dermatologic Skin healing well without signs of infection. Skin edges well coapted without signs of infection.  Orthopedic: Tenderness to palpation noted about the surgical site.     Assessment:   1. Abscess of right foot    Plan:  Patient was evaluated and treated and all questions answered.  S/p foot surgery right -Incisions are well-healed sutures were removed today.  She may return to regular shoe gear and activity as tolerated.  He can resume regular bathing.  I will see her back in 3 weeks for final visit.  No follow-ups on file.

## 2021-06-13 ENCOUNTER — Other Ambulatory Visit: Payer: Self-pay

## 2021-06-13 ENCOUNTER — Encounter: Payer: Self-pay | Admitting: Podiatry

## 2021-06-13 ENCOUNTER — Ambulatory Visit (INDEPENDENT_AMBULATORY_CARE_PROVIDER_SITE_OTHER): Payer: 59 | Admitting: Podiatry

## 2021-06-13 DIAGNOSIS — Z9889 Other specified postprocedural states: Secondary | ICD-10-CM

## 2021-06-13 DIAGNOSIS — M2041 Other hammer toe(s) (acquired), right foot: Secondary | ICD-10-CM

## 2021-06-13 NOTE — Progress Notes (Signed)
?  Subjective:  ?Patient ID: Tricia Munoz, female    DOB: 1961-01-07,  MRN: [id number] ? ?Chief Complaint  ?Patient presents with  ? Routine Post Op  ?  POV #3 DOS 04/27/2021 EXCISION OF SKIN & POSTERAL WEBBING OF 4TH/5TH TOES, POSSIBLE BONE REMOVAL  ? ? ?DOS: 04/27/21 ?Procedure: partial syndactylization right foot ? ?61 y.o. female returns for post-op check.  Overall doing well does not have the pain she used to have still has some tenderness in the top of the incision ? ?Review of Systems: Negative except as noted in the HPI. Denies N/V/F/Ch. ? ? ?Objective:  ?There were no vitals filed for this visit. ?There is no height or weight on file to calculate BMI. ?Constitutional Well developed. ?Well nourished.  ?Vascular Foot warm and well perfused. ?Capillary refill normal to all digits.  Calf is soft and supple, no posterior calf or knee pain, negative Homans' sign  ?Neurologic Normal speech. ?Oriented to person, place, and time. ?Epicritic sensation to light touch grossly present bilaterally.  ?Dermatologic Incision is well-healed there is some thickness to the proximal portion of the incision that is slightly tender  ?Orthopedic: Tenderness to palpation noted about the surgical site.  ? ? ? ?Assessment:  ? ?1. Hammertoe of right foot   ?2. Post-operative state   ? ? ?Plan:  ?Patient was evaluated and treated and all questions answered. ? ?S/p foot surgery right ?-Overall doing well she can tolerate regular shoes at this point.  I have no further restrictions for her.  She will return to see me as needed.  Advised the incision may be tender for some further time to scar tissue building breakdown.  If not better in 6 months could consider steroid injection she will return on a as needed basis. ? ?Return if symptoms worsen or fail to improve.  ?

## 2021-06-21 ENCOUNTER — Ambulatory Visit (INDEPENDENT_AMBULATORY_CARE_PROVIDER_SITE_OTHER): Payer: 59 | Admitting: Family Medicine

## 2021-06-21 ENCOUNTER — Encounter: Payer: Self-pay | Admitting: Family Medicine

## 2021-06-21 ENCOUNTER — Other Ambulatory Visit: Payer: Self-pay

## 2021-06-21 ENCOUNTER — Other Ambulatory Visit (HOSPITAL_COMMUNITY)
Admission: RE | Admit: 2021-06-21 | Discharge: 2021-06-21 | Disposition: A | Discharge status: Home / Self Care | Payer: 59 | Source: Ambulatory Visit | Attending: Family Medicine | Admitting: Family Medicine

## 2021-06-21 VITALS — BP 140/86 | HR 73 | Temp 99.3°F | Resp 16 | Ht 62.0 in | Wt 113.4 lb

## 2021-06-21 DIAGNOSIS — Z0000 Encounter for general adult medical examination without abnormal findings: Secondary | ICD-10-CM

## 2021-06-21 DIAGNOSIS — N952 Postmenopausal atrophic vaginitis: Secondary | ICD-10-CM

## 2021-06-21 DIAGNOSIS — Z23 Encounter for immunization: Secondary | ICD-10-CM

## 2021-06-21 DIAGNOSIS — Z800 Family history of malignant neoplasm of digestive organs: Secondary | ICD-10-CM

## 2021-06-21 DIAGNOSIS — E782 Mixed hyperlipidemia: Secondary | ICD-10-CM | POA: Diagnosis not present

## 2021-06-21 DIAGNOSIS — J4530 Mild persistent asthma, uncomplicated: Secondary | ICD-10-CM

## 2021-06-21 DIAGNOSIS — Z1211 Encounter for screening for malignant neoplasm of colon: Secondary | ICD-10-CM | POA: Diagnosis not present

## 2021-06-21 LAB — CBC
HCT: 38.2 % (ref 36.0–46.0)
Hemoglobin: 12.5 g/dL (ref 12.0–15.0)
MCHC: 32.8 g/dL (ref 30.0–36.0)
MCV: 96.5 fl (ref 78.0–100.0)
Platelets: 312 10*3/uL (ref 150.0–400.0)
RBC: 3.96 Mil/uL (ref 3.87–5.11)
RDW: 13.6 % (ref 11.5–15.5)
WBC: 5.3 10*3/uL (ref 4.0–10.5)

## 2021-06-21 LAB — LIPID PANEL
Cholesterol: 238 mg/dL — ABNORMAL HIGH (ref 0–200)
HDL: 97.6 mg/dL (ref >39.00)
LDL Cholesterol: 116 mg/dL — ABNORMAL HIGH (ref 0–99)
NonHDL: 140.49
Total CHOL/HDL Ratio: 2
Triglycerides: 122 mg/dL (ref 0.0–149.0)
VLDL: 24.4 mg/dL (ref 0.0–40.0)

## 2021-06-21 LAB — COMPREHENSIVE METABOLIC PANEL
ALT: 10 U/L (ref 0–35)
AST: 15 U/L (ref 0–37)
Albumin: 4.7 g/dL (ref 3.5–5.2)
Alkaline Phosphatase: 102 U/L (ref 39–117)
BUN: 11 mg/dL (ref 6–23)
CO2: 30 mEq/L (ref 19–32)
Calcium: 9.7 mg/dL (ref 8.4–10.5)
Chloride: 100 mEq/L (ref 96–112)
Creatinine, Ser: 0.75 mg/dL (ref 0.40–1.20)
GFR: 85.21 mL/min (ref >60.00)
Glucose, Bld: 88 mg/dL (ref 70–99)
Potassium: 3.5 mEq/L (ref 3.5–5.1)
Sodium: 137 mEq/L (ref 135–145)
Total Bilirubin: 0.5 mg/dL (ref 0.2–1.2)
Total Protein: 7.5 g/dL (ref 6.0–8.3)

## 2021-06-21 MED ORDER — ESTROGENS CONJUGATED 0.625 MG/GM VA CREA: TOPICAL_CREAM | VAGINAL | 12 refills | Status: DC

## 2021-06-21 NOTE — Patient Instructions (Signed)
#  Referral I have placed a referral to a specialist for you. You should receive a phone call from the specialty office. Make sure your voicemail is not full and that if you are able to answer your phone to unknown or new numbers.   It may take up to 2 weeks to hear about the referral. If you do not hear anything in 2 weeks, please call our office and ask to speak with the referral coordinator.   

## 2021-06-21 NOTE — Progress Notes (Signed)
Annual Exam   Chief Complaint:  Chief Complaint  Patient presents with   Annual Exam    She would like to discuss positive rhuematoid arthritis result seen in mychart, Painful sex interested in tdap and pneumo vaccine today also pap. Wants to be referred for colonoscopy since she has fam hx of it.     History of Present Illness:  Ms. Tricia Munoz is a 61 y.o. No obstetric history on file. who LMP was Patient's last menstrual period was 11/06/2012., presents today for her annual examination.    Pain with intercourse - deep penetration  Nutrition She does get adequate calcium and Vitamin D in her diet. Diet: not as good as it should be, skipping meals Exercise: walking her dog  Safety The patient wears seatbelts: yes.     The patient feels safe at home and in their relationships: yes.   Menstrual:  Symptoms of menopause: vaginal dryness and pain with sex   GYN She is single partner, contraception - post menopausal status.    Cervical Cancer Screening (21-65):   Last Pap:   March 2016 Results were: no abnormalities /neg HPV DNA unknown  Breast Cancer Screening (Age 30-74):  There is FH of breast cancer. There is no FH of ovarian cancer. BRCA screening Not Indicated.  Last Mammogram: 03/2021 The patient does want a mammogram this year.    Colon Cancer Screening:  Age 36-75 yo - benefits outweigh the risk. Adults 66-85 yo who have never been screened benefit.  Benefits: 134000 people in 2016 will be diagnosed and 49,000 will die - early detection helps [place]ms: Complications 2/2 to colonoscopy High Risk (Colonoscopy): genetic disorder (Lynch syndrome or familial adenomatous polyposis), personal hx of IBD, previous adenomatous polyp, or previous colorectal cancer, FamHx start 10 years before the age at diagnosis, increased in males and black race  Options:  FIT - looks for hemoglobin (blood in the stool) - specific and fairly sensitive - must be done annually Cologuard -  looks for DNA and blood - more sensitive - therefore can have more false positives, every 3 years Colonoscopy - every 10 years if normal - sedation, bowl prep, must have someone drive you  Shared decision making and the patient had decided to do colonoscopy.   Social History   Tobacco Use  Smoking Status Never  Smokeless Tobacco Never    Lung Cancer Screening (Ages 17-00): not applicable   Weight Wt Readings from Last 3 Encounters:  06/21/21 113 lb 6.4 oz (51.4 kg)  01/30/21 109 lb 2 oz (49.5 kg)  10/23/20 114 lb (51.7 kg)   Patient has normal BMI  BMI Readings from Last 1 Encounters:  06/21/21 20.74 kg/m     Chronic disease screening Blood pressure monitoring:  BP Readings from Last 3 Encounters:  06/21/21 140/86  01/30/21 118/80  10/23/20 118/68    Lipid Monitoring: Indication for screening: age >77, obesity, diabetes, family hx, CV risk factors.  Lipid screening: Yes  Lab Results  Component Value Date   CHOL 228 (H) 09/15/2019   HDL 83.60 09/15/2019   LDLCALC 130 (H) 09/15/2019   LDLDIRECT 112.5 04/30/2013   TRIG 73.0 09/15/2019   CHOLHDL 3 09/15/2019     Diabetes Screening: age >57, overweight, family hx, PCOS, hx of gestational diabetes, at risk ethnicity Diabetes Screening screening: Yes  No results found for: HGBA1C   Past Medical History:  Diagnosis Date   ADHD    Asthma    Head injury 08/04/2017  Hypertension    Memory difficulties 10/08/2017   OA (osteoarthritis)    Osteopenia     No past surgical history on file.  Prior to Admission medications   Medication Sig Start Date End Date Taking? Authorizing Provider  albuterol (VENTOLIN HFA) 108 (90 Base) MCG/ACT inhaler Inhale 2 puffs into the lungs every 4 (four) hours as needed for wheezing or shortness of breath. 02/05/20  Yes Caren Macadam, MD  Azelastine-Fluticasone 137-50 MCG/ACT SUSP 2 spray in each nostril daily 02/19/18  Yes Lucille Passy, MD  benazepril (LOTENSIN) 10 MG  tablet Take 1 tablet (10 mg total) by mouth daily. 10/09/20  Yes Lesleigh Noe, MD  budesonide-formoterol [facility]) 80-4.5 MCG/ACT inhaler Inhale 2 puffs into the lungs in the morning and at bedtime. 07/20/20  Yes Lesleigh Noe, MD  Cholecalciferol (VITAMIN D) 2000 units CAPS Take by mouth.   Yes [provider]  hydrochlorothiazide (HYDRODIURIL) 12.5 MG tablet Take 1 tablet (12.5 mg total) by mouth daily. 10/09/20  Yes Lesleigh Noe, MD  levocetirizine (XYZAL) 5 MG tablet TAKE ONE TABLET BY MOUTH EACH EVENING 04/29/19  Yes Lucille Passy, MD  Multiple Vitamins-Minerals (WOMENS MULTIVITAMIN PO) Take by mouth.   Yes [provider]  simvastatin (ZOCOR) 10 MG tablet Take 1 tablet (10 mg total) by mouth daily. 07/20/20  Yes Lesleigh Noe, MD  venlafaxine XR (EFFEXOR-XR) 150 MG 24 hr capsule TAKE 1 CAPSULE BY MOUTH DAILY WITH BREAKFAST. 03/26/21  Yes Lesleigh Noe, MD  vitamin C (ASCORBIC ACID) 250 MG tablet Take 250 mg by mouth daily.   Yes [provider]  azelastine (OPTIVAR) 0.05 % ophthalmic solution Apply 1 drop to eye 2 (two) times daily. 06/09/17   Lucille Passy, MD  diazepam (VALIUM) 5 MG tablet Take 5 mg by mouth 2 (two) times daily as needed. Patient not taking: Reported on 01/30/2021 09/12/20   [provider]  ketoconazole (NIZORAL) 2 % cream Apply 1 application topically daily. 02/14/21   McDonald, Stephan Minister, DPM  mupirocin ointment (BACTROBAN) 2 % Apply 1 application topically 2 (two) times daily. 01/30/21   Lesleigh Noe, MD  triazolam (HALCION) 0.25 MG tablet Prior to dental procedure Patient not taking: Reported on 06/21/2021 09/12/20   [provider]    Allergies  Allergen Reactions   Evekeo [Amphetamine Sulfate]    Latex Hives   Montelukast     itch   Singulair [Montelukast Sodium] Rash    Gynecologic History: Patient's last menstrual period was 11/06/2012.  Obstetric History: No obstetric history on file.  Social History    Socioeconomic History   Marital status: Married    Spouse name: Louis   Number of children: 1   Years of education: high school   Highest education level: Not on file  Occupational History   Occupation: self employeed  Tobacco Use   Smoking status: Never   Smokeless tobacco: Never  Vaping Use   Vaping Use: Never used  Substance and Sexual Activity   Alcohol use: No   Drug use: No   Sexual activity: Yes    Birth control/protection: Post-menopausal  Other Topics Concern   Not on file  Social History Narrative   Lives in [place] with husband.    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 [employer]      Family: Inez Catalina       Enjoys: cooking, Firefighter, crafts, reading, hiking      Exercise: walking - active throughout the day   Diet: generally healthy, not a big meat eater      Safety   Seat belts: Yes    Guns: Yes  and secure   Safe in relationships: Yes    Social Determinants of Radio broadcast assistant Strain: Not on file  Food Insecurity: Not on file  Transportation Needs: Not on file  Physical Activity: Not on file  Stress: Not on file  Social Connections: Not on file  Intimate Partner Violence: Not on file    Family History  Problem Relation Age of Onset   Dementia Mother    Rheum arthritis Mother    Hypertension Father    Allergic rhinitis Father    Colon cancer Father    Lung cancer Father    Kidney failure Sister        s/p transplant   Breast cancer Maternal Aunt 67       1/2 sister   Angioedema Neg Hx    Asthma Neg Hx    Eczema Neg Hx    Immunodeficiency Neg Hx    Urticaria Neg Hx     Review of Systems  Constitutional:  Negative for chills and fever.  HENT:  Negative for congestion and sore throat.   Eyes:  Negative for blurred vision and double vision.  Respiratory:  Negative  for shortness of breath.   Cardiovascular:  Negative for chest pain.  Gastrointestinal:  Negative for heartburn, nausea and vomiting.  Genitourinary: Negative.        Painful intercourse  Musculoskeletal:  Positive for joint pain. Negative for myalgias.  Skin:  Negative for rash.  Neurological:  Negative for dizziness and headaches.  Endo/Heme/Allergies:  Does not bruise/bleed easily.  Psychiatric/Behavioral:  Negative for depression. The patient is not nervous/anxious.     Physical Exam BP 140/86 (BP Location: Left Arm, Patient Position: Sitting, Cuff Size: Normal)    Pulse 73    Temp 99.3 F (37.4 C) (Oral)    Resp 16    Ht _0  (1.575 m)    Wt 113 lb 6.4 oz (51.4 kg)    LMP 11/06/2012    SpO2 97%    BMI 20.74 kg/m    BP Readings from Last 3 Encounters:  06/21/21 140/86  01/30/21 118/80  10/23/20 118/68      Physical Exam Exam conducted with a chaperone present.  Constitutional:      General: She is not in acute distress.    Appearance: She is well-developed. She is not diaphoretic.  HENT:     Head: Normocephalic and atraumatic.     Right Ear: External ear normal.     Left Ear: External ear normal.     Nose: Nose normal.  Eyes:     General: No scleral icterus.    Extraocular Movements: Extraocular movements intact.     Conjunctiva/sclera: Conjunctivae normal.  Cardiovascular:     Rate and Rhythm: Normal rate and regular rhythm.     Heart sounds: No murmur heard. Pulmonary:     Effort: Pulmonary effort is normal. No respiratory distress.     Breath sounds: Normal breath sounds. No wheezing.  Abdominal:     General: Bowel sounds are normal. There is no distension.     Palpations: Abdomen is soft. There is  no mass.     Tenderness: There is no abdominal tenderness. There is no guarding or rebound.  Genitourinary:    Comments: Vaginal atrophy Scaring on cervix Musculoskeletal:        General: Normal range of motion.     Cervical back: Neck supple.  Lymphadenopathy:      Cervical: No cervical adenopathy.  Skin:    General: Skin is warm and dry.     Capillary Refill: Capillary refill takes less than 2 seconds.  Neurological:     Mental Status: She is alert and oriented to person, place, and time.     Deep Tendon Reflexes: Reflexes normal.  Psychiatric:        Mood and Affect: Mood normal.        Behavior: Behavior normal.    Results:  PHQ-9:  [place] Office Visit from 10/08/2017 in [facility]  PHQ-9 Total Score 4         Assessment: 61 y.o. No obstetric history on file. female here for routine annual physical examination.  Plan: Problem List Items Addressed This Visit       Respiratory   Mild persistent asthma     Other   HLD (hyperlipidemia)   Relevant Orders   Lipid panel   Other Visit Diagnoses     Annual physical exam    -  Primary   Relevant Orders   Comprehensive metabolic panel   CBC   Cytology - PAP([place])   Family history of colon cancer       Relevant Orders   Ambulatory referral to Gastroenterology   Screening for colon cancer       Relevant Orders   Ambulatory referral to Gastroenterology   Need for Tdap vaccination       Relevant Orders   Tdap vaccine greater than or equal to 7yo IM (Completed)   Need for vaccination against Streptococcus pneumoniae       Relevant Orders   Pneumococcal conjugate vaccine 20-valent (Prevnar 20) (Completed)       Screening: -- Blood pressure screen normal -- cholesterol screening: will obtain -- Weight screening: normal -- Diabetes Screening: will obtain -- Nutrition: Encouraged healthy diet  The 10-year ASCVD risk score (Arnett DK, et al., 2019) is: 5.6%   Values used to calculate the score:     Age: 77 years     Sex: Female     Is Non-Hispanic African American: No     Diabetic: No     Tobacco smoker: No     Systolic Blood Pressure: 037 mmHg     Is BP treated: Yes     HDL Cholesterol: 83.6 mg/dL     Total Cholesterol: 228  mg/dL  -- Statin therapy for Age 6-75 with CVD risk >7.5%  Psych -- Depression screening (PHQ-9):  [place] Visit from 10/08/2017 in [facility]  PHQ-9 Total Score 4        Safety -- tobacco screening: not using -- alcohol screening:  low-risk usage. -- no evidence of domestic violence or intimate partner violence.   Cancer Screening -- pap smear collected per ASCCP guidelines -- family history of breast cancer screening: done. not at high risk. -- Mammogram -  up to date -- Colon cancer (age 45+)-- requested  Immunizations Immunization History  Administered Date(s) Administered   Influenza Inj Mdck Quad Pf 12/12/2017   Influenza Split 01/13/2013   Influenza Whole 02/17/2009   Influenza, Seasonal, Injecte, Preservative Fre  03/18/2012   Influenza,inj,Quad PF,6+ Mos 12/09/2013, 03/15/2015, 12/25/2015, 01/15/2017, 02/10/2019, 01/30/2021   PFIZER Comirnaty(Gray Top)Covid-19 Tri-Sucrose Vaccine 07/06/2019, 07/27/2019, 03/31/2020   PNEUMOCOCCAL CONJUGATE-20 06/21/2021   Pfizer Covid-19 Vaccine Bivalent Booster 47yr & up 02/13/2021   Td 02/17/2009   Tdap 06/21/2021   Zoster Recombinat (Shingrix) 09/12/2017, 12/17/2017   Zoster, Live 01/13/2013    -- flu vaccine up to date -- TDAP q10 years up to date -- Shingles (age >>53 up to date -- PPSV-23 (19-64 with chronic disease or smoking) up to date -- Covid-19 Vaccine up to date   Encouraged healthy diet and exercise. Encouraged regular vision and dental care.    JLesleigh Noe MD

## 2021-06-25 LAB — CYTOLOGY - PAP
Comment: NEGATIVE
Diagnosis: NEGATIVE
High risk HPV: NEGATIVE

## 2021-06-27 ENCOUNTER — Ambulatory Visit (INDEPENDENT_AMBULATORY_CARE_PROVIDER_SITE_OTHER): Payer: 59 | Admitting: Psychology

## 2021-06-27 DIAGNOSIS — F419 Anxiety disorder, unspecified: Secondary | ICD-10-CM | POA: Diagnosis not present

## 2021-06-27 DIAGNOSIS — F330 Major depressive disorder, recurrent, mild: Secondary | ICD-10-CM | POA: Diagnosis not present

## 2021-06-27 DIAGNOSIS — F900 Attention-deficit hyperactivity disorder, predominantly inattentive type: Secondary | ICD-10-CM

## 2021-06-27 DIAGNOSIS — R69 Illness, unspecified: Secondary | ICD-10-CM | POA: Diagnosis not present

## 2021-06-27 NOTE — Progress Notes (Signed)
[facility] Counselor Initial Adult Exam ? ?Name: Tricia Munoz ?Date: 06/27/2021 ?MRN: [id number] ?DOB: 1961/04/13 ?PCP: Lynnda Child, MD ? ?Time spent: 60 minutes ? ?Guardian/Payee:  N/A  ? ?Paperwork requested: No  ? ?Reason for Visit /Presenting Problem: Patient is requesting therapy to help her work through some issues she is having with aging. ? ?Mental Status Exam: ?Appearance:   Casual     ?Behavior:  Appropriate  ?Motor:  Normal  ?Speech/Language:   Normal Rate  ?Affect:  Appropriate  ?Mood:  anxious  ?Thought process:  normal  ?Thought content:    WNL  ?Sensory/Perceptual disturbances:    WNL  ?Orientation:  oriented to person, place, time/date, and situation  ?Attention:  Good  ?Concentration:  Good  ?Memory:  WNL  ?Fund of knowledge:   Good  ?Insight:    Good  ?Judgment:   Good  ?Impulse Control:  Good  ? ? ?Reported Symptoms:  anxiety and some depression and frustration ? ?Risk Assessment: ?Danger to Self:  No ?Self-injurious Behavior: No ?Danger to Others: No ?Duty to Warn:no ?Physical Aggression / Violence:No  ?Access to Firearms a concern: No  ?Gang Involvement:No  ?Patient / guardian was educated about steps to take if suicide or homicide risk level increases between visits: n/a ?While future psychiatric events cannot be accurately predicted, the patient does not currently require acute inpatient psychiatric care and does not currently meet [facility] involuntary commitment criteria. ? ?Substance Abuse History: ?Current substance abuse: No    ? ?Past Psychiatric History:   ?Previous psychological history is significant for ADHD, depression, and history of alcohol dependence ?Outpatient Providers: Unknown ?History of Psych Hospitalization: No  ?Psychological Testing: none ? ?Abuse History:  ?Victim of: No., None  ?Report needed: no ?Victim of Neglect:no ?Perpetrator of no ?Witness / Exposure to Domestic Violence: no ?Protective Services Involvement: no ?Witness to [employer]  Violence:  no ? ?Family History:  ?Family History  ?Problem Relation Age of Onset  ? Dementia Mother   ? Rheum arthritis Mother   ? Hypertension Father   ? Allergic rhinitis Father   ? Colon cancer Father   ? Lung cancer Father   ? Kidney failure Sister   ?     s/p transplant  ? Breast cancer Maternal Aunt 63  ?     1/2 sister  ? Angioedema Neg Hx   ? Asthma Neg Hx   ? Eczema Neg Hx   ? Immunodeficiency Neg Hx   ? Urticaria Neg Hx   ? ? ?Living situation: the patient lives with their spouse ? ?Sexual Orientation: Straight ? ?Relationship Status: married  ?Name of spouse / other: Louis ?If a parent, number of children / ages:1 daughter, 57 ? ?Support Systems: husband, daughter ? ?Financial Stress:  No  ? ?Income/Employment/Disability: Social Security Retirement ? ?Military Service: No  ? ?Educational History: ?Education: high school diploma/GED ? ?Religion/Sprituality/World View: ?Protestant ? ?Any cultural differences that may affect / interfere with treatment:  not applicable  ? ?Recreation/Hobbies: gardening ? ?Stressors: losses, aging concerns ? ?Strengths: Supportive Relationships, Family, Friends, Spirituality, Hopefulness, Journalist, newspaper, and Able to Communicate Effectively ? ?Barriers:  none ? ?Legal History: ?Pending legal issue / charges: The patient has no significant history of legal issues. ?History of legal issue / charges: none ? ?Medical History/Surgical History: reviewed ?Past Medical History:  ?Diagnosis Date  ? ADHD   ? Asthma   ? Head injury 08/04/2017  ? Hypertension   ? Memory difficulties 10/08/2017  ?  OA (osteoarthritis)   ? Osteopenia   ? ? ?No past surgical history on file. ? ?Medications: ?Current Outpatient Medications  ?Medication Sig Dispense Refill  ? albuterol (VENTOLIN HFA) 108 (90 Base) MCG/ACT inhaler Inhale 2 puffs into the lungs every 4 (four) hours as needed for wheezing or shortness of breath. 18 g 2  ? azelastine (OPTIVAR) 0.05 % ophthalmic solution Apply 1 drop to eye 2 (two)  times daily. 6 mL 12  ? Azelastine-Fluticasone 137-50 MCG/ACT SUSP 2 spray in each nostril daily 1 Bottle 3  ? benazepril (LOTENSIN) 10 MG tablet Take 1 tablet (10 mg total) by mouth daily. 90 tablet 3  ? budesonide-formoterol (SYMBICORT) 80-4.5 MCG/ACT inhaler Inhale 2 puffs into the lungs in the morning and at bedtime. 1 each 0  ? Cholecalciferol (VITAMIN D) 2000 units CAPS Take by mouth.    ? conjugated estrogens (PREMARIN) vaginal cream Place one applicator at bedtime twice weekly. 42.5 g 12  ? diazepam (VALIUM) 5 MG tablet Take 5 mg by mouth 2 (two) times daily as needed. (Patient not taking: Reported on 01/30/2021)    ? hydrochlorothiazide (HYDRODIURIL) 12.5 MG tablet Take 1 tablet (12.5 mg total) by mouth daily. 90 tablet 3  ? ketoconazole (NIZORAL) 2 % cream Apply 1 application topically daily. 60 g 2  ? levocetirizine (XYZAL) 5 MG tablet TAKE ONE TABLET BY MOUTH EACH EVENING 30 tablet 11  ? Multiple Vitamins-Minerals (WOMENS MULTIVITAMIN PO) Take by mouth.    ? mupirocin ointment (BACTROBAN) 2 % Apply 1 application topically 2 (two) times daily. 22 g 0  ? simvastatin (ZOCOR) 10 MG tablet Take 1 tablet (10 mg total) by mouth daily. 90 tablet 3  ? triazolam (HALCION) 0.25 MG tablet Prior to dental procedure (Patient not taking: Reported on 06/21/2021)    ? venlafaxine XR (EFFEXOR-XR) 150 MG 24 hr capsule TAKE 1 CAPSULE BY MOUTH DAILY WITH BREAKFAST. 90 capsule 1  ? vitamin C (ASCORBIC ACID) 250 MG tablet Take 250 mg by mouth daily.    ? ?No current facility-administered medications for this visit.  ? ? ?Allergies  ?Allergen Reactions  ? Evekeo [Amphetamine Sulfate]   ? Latex Hives  ? Montelukast   ?  itch  ? Singulair [Montelukast Sodium] Rash  ? ? ?Diagnoses:  ?Anxiety ? ?Major depressive disorder, recurrent episode, mild (HCC) ? ?Attention deficit hyperactivity disorder (ADHD), predominantly inattentive type ? ?Plan of Care: Will see patient every week for a few weeks and then will spread appointments out.   Will work on Treatment plan at next visit. ? ? ?D'Arcy Abraha G Jeanette Moffatt, LCSW  ? ? ? ? ? ? ? ? ? ? ? ? ? ? ? ? ? ?Savyon Loken G Fidela Cieslak, LCSW ?

## 2021-07-02 ENCOUNTER — Other Ambulatory Visit: Payer: Self-pay | Admitting: Family Medicine

## 2021-07-02 DIAGNOSIS — E785 Hyperlipidemia, unspecified: Secondary | ICD-10-CM

## 2021-07-02 MED ORDER — SIMVASTATIN 40 MG PO TABS: 40.0000 mg | ORAL_TABLET | Freq: Every day | ORAL | 1 refills | Status: DC

## 2021-07-05 ENCOUNTER — Ambulatory Visit (INDEPENDENT_AMBULATORY_CARE_PROVIDER_SITE_OTHER): Payer: 59 | Admitting: Psychology

## 2021-07-05 DIAGNOSIS — F900 Attention-deficit hyperactivity disorder, predominantly inattentive type: Secondary | ICD-10-CM | POA: Diagnosis not present

## 2021-07-05 DIAGNOSIS — F330 Major depressive disorder, recurrent, mild: Secondary | ICD-10-CM

## 2021-07-05 DIAGNOSIS — F419 Anxiety disorder, unspecified: Secondary | ICD-10-CM

## 2021-07-05 DIAGNOSIS — R69 Illness, unspecified: Secondary | ICD-10-CM | POA: Diagnosis not present

## 2021-07-06 NOTE — Progress Notes (Signed)
[facility] Counselor/Therapist Progress Note ? ?Patient ID: Tricia Munoz, MRN: [id number],   ? ?Date: 07/05/2021 ? ?Time Spent: 60 minutes ? ?Treatment Type: Individual Therapy ? ?Reported Symptoms: sadness, anxiety ? ?Mental Status Exam: ?Appearance:  Casual     ?Behavior: Appropriate  ?Motor: Normal  ?Speech/Language:  Normal Rate  ?Affect: Appropriate  ?Mood: normal  ?Thought process: normal  ?Thought content:   WNL  ?Sensory/Perceptual disturbances:   WNL  ?Orientation: oriented to person, place, time/date, and situation  ?Attention: Good  ?Concentration: Good  ?Memory: WNL  ?Fund of knowledge:  Good  ?Insight:   Good  ?Judgment:  Good  ?Impulse Control: Good  ? ?Risk Assessment: ?Danger to Self:  No ?Self-injurious Behavior: No ?Danger to Others: No ?Duty to Warn:no ?Physical Aggression / Violence:No  ?Access to Firearms a concern: No  ?Gang Involvement:No  ? ?Subjective: The patient attended a face-to-face individual therapy session via video visit today.  The patient gave consent for the video visit to be on web ex.  The patient was in her home and therapist was in the office.  The patient reports that she has to speak tomorrow at her AA meeting and talked about some issues related to her family of origin that has brought her to the place she is now.  We talked about how family dynamics work and how things are developed in her personality through her experiences in her life.  The patient verbalized understanding of concepts discussed and felt that it was helpful that she could use this in her speech tomorrow. ? ?Interventions: Cognitive Behavioral Therapy, Insight-Oriented, Family Systems, and Interpersonal ? ?Diagnosis:Anxiety ? ?Major depressive disorder, recurrent episode, mild (HCC) ? ?Attention deficit hyperactivity disorder (ADHD), predominantly inattentive type ? ?Plan: Please see treatment plan in Therapy charts with target date of 07/07/2022.  Patient approved this treatment  plan. ? ?Abdo Denault G Shone Leventhal, LCSW ? ? ? ?

## 2021-07-09 MED ORDER — SIMVASTATIN 20 MG PO TABS: 40.0000 mg | ORAL_TABLET | Freq: Every day | ORAL | 3 refills | Status: DC

## 2021-07-09 NOTE — Addendum Note (Signed)
Addended by: Waunita Schooner R on: 07/09/2021 10:35 AM ? ? Modules accepted: Orders ? ?

## 2021-07-09 NOTE — Telephone Encounter (Signed)
I spoke to pharmacy and they do have the 20 mg tablet available if youd like to send in a new script to take 2.  ?

## 2021-07-09 NOTE — Telephone Encounter (Signed)
Clarify with patient.  ? ?Would recommend Atorvastatin 20 mg if unable to fine simvastatin ?

## 2021-07-12 ENCOUNTER — Ambulatory Visit (INDEPENDENT_AMBULATORY_CARE_PROVIDER_SITE_OTHER): Payer: 59 | Admitting: Psychology

## 2021-07-12 DIAGNOSIS — R69 Illness, unspecified: Secondary | ICD-10-CM | POA: Diagnosis not present

## 2021-07-12 DIAGNOSIS — F330 Major depressive disorder, recurrent, mild: Secondary | ICD-10-CM | POA: Diagnosis not present

## 2021-07-12 DIAGNOSIS — F900 Attention-deficit hyperactivity disorder, predominantly inattentive type: Secondary | ICD-10-CM

## 2021-07-12 NOTE — Progress Notes (Signed)
Tricia Munoz, MRN: [id number],   ? ?Date: 07/12/2021 ? ?Time Spent: 60 minutes ? ?Treatment Type: Individual Therapy ? ?Reported Symptoms: sadness, anxiety ? ?Mental Status Exam: ?Appearance:  Casual     ?Behavior: Appropriate  ?Motor: Normal  ?Speech/Language:  Normal Rate  ?Affect: Appropriate  ?Mood: normal  ?Thought process: normal  ?Thought content:   WNL  ?Sensory/Perceptual disturbances:   WNL  ?Orientation: oriented to person, place, time/date, and situation  ?Attention: Good  ?Concentration: Good  ?Memory: WNL  ?Fund of knowledge:  Good  ?Insight:   Good  ?Judgment:  Good  ?Impulse Control: Good  ? ?Risk Assessment: ?Danger to Self:  No ?Self-injurious Behavior: No ?Danger to Others: No ?Duty to Warn:no ?Physical Aggression / Violence:No  ?Access to Firearms a concern: No  ?Gang Involvement:No  ? ?Subjective: The patient attended a face-to-face individual therapy session via video visit today.  The patient gave consent for the video visit to be on web ex.  The patient was in her home and therapist was in the office.  The patient presents as pleasant and cooperative.  The patient reports that she did go to [place] last weekend.  We talked about a situation that she had to deal with in [place] that was concerning to her.  It involved one of her relatives and her relatives grandchildren.  The patient felt that the information that we discussed was very helpful. ? ?Interventions: Cognitive Behavioral Therapy, Insight-Oriented, Family Systems, and Interpersonal ? ?Diagnosis:Major depressive disorder, recurrent episode, mild (HCC) ? ?Attention deficit hyperactivity disorder (ADHD), predominantly inattentive type ? ?Plan: Please see treatment plan in Therapy charts with target date of 07/07/2022.  Patient approved this treatment plan. ? ?Tricia Mullings G Johsua Shevlin, LCSW ? ? ? ? ? ? ? ? ? ? ? ? ? ? ? ? ? ?Tricia Malbrough G Masie Bermingham, LCSW ?

## 2021-07-13 ENCOUNTER — Other Ambulatory Visit: Payer: Self-pay | Admitting: Family Medicine

## 2021-07-13 DIAGNOSIS — E785 Hyperlipidemia, unspecified: Secondary | ICD-10-CM

## 2021-07-19 ENCOUNTER — Ambulatory Visit (INDEPENDENT_AMBULATORY_CARE_PROVIDER_SITE_OTHER): Payer: 59 | Admitting: Psychology

## 2021-07-19 DIAGNOSIS — F900 Attention-deficit hyperactivity disorder, predominantly inattentive type: Secondary | ICD-10-CM

## 2021-07-19 DIAGNOSIS — F330 Major depressive disorder, recurrent, mild: Secondary | ICD-10-CM

## 2021-07-19 DIAGNOSIS — R69 Illness, unspecified: Secondary | ICD-10-CM | POA: Diagnosis not present

## 2021-07-19 NOTE — Progress Notes (Signed)
[name] Counselor/Therapist Progress Note ? ?Patient ID: Tricia Munoz, MRN: [id number],   ? ?Date: 07/19/2021 ? ?Time Spent: 60 minutes ? ?Treatment Type: Individual Therapy ? ?Reported Symptoms: sadness, anxiety ? ?Mental Status Exam: ?Appearance:  Casual     ?Behavior: Appropriate  ?Motor: Normal  ?Speech/Language:  Normal Rate  ?Affect: Appropriate  ?Mood: normal  ?Thought process: normal  ?Thought content:   WNL  ?Sensory/Perceptual disturbances:   WNL  ?Orientation: oriented to person, place, time/date, and situation  ?Attention: Good  ?Concentration: Good  ?Memory: WNL  ?Fund of knowledge:  Good  ?Insight:   Good  ?Judgment:  Good  ?Impulse Control: Good  ? ?Risk Assessment: ?Danger to Self:  No ?Self-injurious Behavior: No ?Danger to Others: No ?Duty to Warn:no ?Physical Aggression / Violence:No  ?Access to Firearms a concern: No  ?Gang Involvement:No  ? ?Subjective: The patient attended a face-to-face individual therapy session via video visit today.  The patient gave consent for the video visit to be on web ex.  The patient was in her home and therapist was in the office.  The patient presents as pleasant and cooperative.  The patient reports that she has been doing well over the last week.  The patient states that one of the things she wants to bring up in therapy is how to deal with her aging process in a better way.  We talked about doing what she can to plan for the future and also talked about emotionally how to prepare herself for the future and we discussed the latter psycho stuck social stages of development that she is experiencing and the things that she might run into as she moves forward with her process.  The patient finds therapy very helpful and that it helps her be able to manage her emotions better and just think of things in a different way. ? ?Interventions: Cognitive Behavioral Therapy, Insight-Oriented, Family Systems, and Interpersonal ? ?Diagnosis:Major  depressive disorder, recurrent episode, mild (HCC) ? ?Attention deficit hyperactivity disorder (ADHD), predominantly inattentive type ? ?Plan: Please see treatment plan in Therapy charts with target date of 07/07/2022.  Patient approved this treatment plan. ? ?Arnitra Sokoloski G Pleshette Tomasini, LCSW ? ? ? ? ? ? ? ? ? ? ? ? ? ? ? ? ? ?[name] G Breanne Olvera, LCSW ? ? ? ? ? ? ? ? ? ? ? ? ? ? ?Rox Mcgriff G Hendrix Console, LCSW ?

## 2021-07-27 ENCOUNTER — Ambulatory Visit (INDEPENDENT_AMBULATORY_CARE_PROVIDER_SITE_OTHER): Payer: 59 | Admitting: Psychology

## 2021-07-27 DIAGNOSIS — F900 Attention-deficit hyperactivity disorder, predominantly inattentive type: Secondary | ICD-10-CM

## 2021-07-27 DIAGNOSIS — F330 Major depressive disorder, recurrent, mild: Secondary | ICD-10-CM

## 2021-07-27 DIAGNOSIS — R69 Illness, unspecified: Secondary | ICD-10-CM | POA: Diagnosis not present

## 2021-07-27 NOTE — Progress Notes (Signed)
[facility] Counselor/Therapist Progress Note ? ?Patient ID: Tricia Munoz, MRN: [id number],   ? ?Date: 07/27/2021 ? ?Time Spent: 45 minutes ? ?Treatment Type: Individual Therapy ? ?Reported Symptoms: sadness, anxiety ? ?Mental Status Exam: ?Appearance:  Casual     ?Behavior: Appropriate  ?Motor: Normal  ?Speech/Language:  Normal Rate  ?Affect: Appropriate  ?Mood: normal  ?Thought process: normal  ?Thought content:   WNL  ?Sensory/Perceptual disturbances:   WNL  ?Orientation: oriented to person, place, time/date, and situation  ?Attention: Good  ?Concentration: Good  ?Memory: WNL  ?Fund of knowledge:  Good  ?Insight:   Good  ?Judgment:  Good  ?Impulse Control: Good  ? ?Risk Assessment: ?Danger to Self:  No ?Self-injurious Behavior: No ?Danger to Others: No ?Duty to Warn:no ?Physical Aggression / Violence:No  ?Access to Firearms a concern: No  ?Gang Involvement:No  ? ?Subjective: The patient attended a face-to-face individual therapy session via video visit today.  The patient gave consent for the video visit to be on web ex.  The patient was in her home and therapist was in the office.  The patient presents as pleasant and cooperative.  The patient states that she feels like she is doing very well now.  We talked about different things that are going on in her life and she feels like she has a handle on things.  The patient does want to continue to stay in touch in regards to being able to get back on my schedule if needed because she feels that it is helpful to have a therapist during different times in her life where they are difficult.  We decided to cancel some of the appointments and we made one about a month and a half out we will keep her on her on my schedule in case she would like to get back in.  We talked about her going to [place] this weekend and we also talked about some of her friends were having difficulties and I recommended that she be supportive but not take on their problems.   The patient feels that therapy is very helpful for her and wants to continue. ? ?Interventions: Cognitive Behavioral Therapy, Insight-Oriented, Family Systems, and Interpersonal ? ?Diagnosis:Major depressive disorder, recurrent episode, mild ([place]) ? ?Attention deficit hyperactivity disorder (ADHD), predominantly inattentive type ? ?Plan: Please see treatment plan in Therapy charts with target date of 07/07/2022.  Patient approved this treatment plan. ? ?Geana Walts G Alissia Lory, LCSW ? ? ? ? ? ? ? ? ? ? ? ? ? ? ? ? ? ?Reuben Knoblock G Amory Zbikowski, LCSW ? ? ? ? ? ? ? ? ? ? ? ? ? ? ?Thu Baggett G Exodus Kutzer, LCSW ? ? ? ? ? ? ? ? ? ? ? ? ? ? ?Marlin Jarrard G Kathy Wares, LCSW ?

## 2021-08-09 ENCOUNTER — Ambulatory Visit: Payer: 59 | Admitting: Psychology

## 2021-08-16 ENCOUNTER — Ambulatory Visit: Payer: 59 | Admitting: Psychology

## 2021-08-24 ENCOUNTER — Ambulatory Visit: Payer: 59 | Admitting: Psychology

## 2021-09-11 ENCOUNTER — Encounter: Payer: Self-pay | Admitting: Family Medicine

## 2021-09-12 ENCOUNTER — Other Ambulatory Visit: Payer: Self-pay | Admitting: Family Medicine

## 2021-09-12 DIAGNOSIS — F32A Depression, unspecified: Secondary | ICD-10-CM

## 2021-10-02 ENCOUNTER — Ambulatory Visit (INDEPENDENT_AMBULATORY_CARE_PROVIDER_SITE_OTHER): Payer: 59 | Admitting: Psychology

## 2021-10-02 DIAGNOSIS — F419 Anxiety disorder, unspecified: Secondary | ICD-10-CM | POA: Diagnosis not present

## 2021-10-02 DIAGNOSIS — R69 Illness, unspecified: Secondary | ICD-10-CM | POA: Diagnosis not present

## 2021-10-02 DIAGNOSIS — F900 Attention-deficit hyperactivity disorder, predominantly inattentive type: Secondary | ICD-10-CM | POA: Diagnosis not present

## 2021-10-02 DIAGNOSIS — F330 Major depressive disorder, recurrent, mild: Secondary | ICD-10-CM | POA: Diagnosis not present

## 2021-10-03 NOTE — Progress Notes (Signed)
[facility] Counselor/Therapist Progress Note  Patient ID: Tricia Munoz, MRN: [id number],    Date: 10/02/2021  Time Spent: 45 minutes  Treatment Type: Individual Therapy  Reported Symptoms: sadness, anxiety  Mental Status Exam: Appearance:  Casual     Behavior: Appropriate  Motor: Normal  Speech/Language:  Normal Rate  Affect: Appropriate  Mood: normal  Thought process: normal  Thought content:   WNL  Sensory/Perceptual disturbances:   WNL  Orientation: oriented to person, place, time/date, and situation  Attention: Good  Concentration: Good  Memory: WNL  Fund of knowledge:  Good  Insight:   Good  Judgment:  Good  Impulse Control: Good   Risk Assessment: Danger to Self:  No Self-injurious Behavior: No Danger to Others: No Duty to Warn:no Physical Aggression / Violence:No  Access to Firearms a concern: No  Gang Involvement:No   Subjective: The patient attended a face-to-face individual therapy session via video visit today.  The patient gave consent for the video visit to be on web ex.  The patient was in her home and therapist was in the office.  The patient presents as pleasant and cooperative.  The patient continues to do well and is going to meetings on a regular basis.  The patient reports that her daughter is 5 months pregnant and they just got back from vacation.  The patient wants to continue to have periodic therapy sessions so that she can make sure that she has some support when she needs it as she sometimes struggles with dealing with things.  Right now all the things she is dealing with the most is her husband being retired and we talked about how to cope with that. Interventions: Cognitive Behavioral Therapy, Insight-Oriented, Family Systems, and Interpersonal  Diagnosis:Major depressive disorder, recurrent episode, mild (HCC)  Attention deficit hyperactivity disorder (ADHD), predominantly inattentive type  Anxiety  Plan:Client  Abilities/Strengths  Intelligent, insightful, supportive husband, sober  Client Treatment Preferences  Outpatient Individual therapy weekly or bi weekly  Client Statement of Needs  "I want to be happy with myself"  Treatment Level  Outpatient Individual therapy  Symptoms  Difficulty in saying no to others; assumes not being liked by others.: No Description Entered (Status:  improved). Fear of rejection by others, especially peer group.: No Description Entered (Status:  improved).  Problems Addressed  Low Self-Esteem, Low Self-Esteem, Low Self-Esteem  Goals 1. Demonstrate improved self-esteem through more pride in appearance,  more assertiveness, greater eye contact, and identification of  positive traits in self-talk messages. 2. Elevate self-esteem. 3. Establish an inward sense of self-worth, confidence, and competence. Objective Identify and replace negative self-talk messages used to reinforce low self-esteem. Target Date: 2022-07-07 Frequency: Weekly Progress: 80 Modality: individual Related Interventions 1. Help the client identify his/her distorted, negative beliefs about self and the world and replace  these messages with more realistic, affirmative messages (or assign "Journal and Replace SelfDefeating Thoughts" in the Adult Psychotherapy Homework Planner by [facility] or read What  to Say When You Talk to Yourself by [employer]). Objective Identify and engage in activities that would improve self-image by being consistent with one's values. Target Date: 2022-07-07 Frequency: Weekly Progress: 80 Modality: individual Objective Demonstrate an increased ability to identify and express personal feelings. Target Date: 2022-07-07 Frequency: Weekly Progress: 80 Modality: individual Diagnosis Axis none 296.31 (Major depressive affective  disorder, recurrent episode, mild) -   Adjustment  Disorder  Unspecified  Conditions For Discharge Achievement of treatment goals and  objectives   Patient approved  this treatment plan.  Rylin Saez G Keagan Anthis, LCSW                  Fidela Cieslak G Jmarion Christiano, LCSW               Estanislado Surgeon G Elton Catalano, LCSW               Layce Sprung G Delorus Langwell, LCSW               Sheleen Conchas G Davaughn Hillyard, LCSW

## 2021-10-14 ENCOUNTER — Other Ambulatory Visit: Payer: Self-pay | Admitting: Family Medicine

## 2021-10-14 DIAGNOSIS — I10 Essential (primary) hypertension: Secondary | ICD-10-CM

## 2021-10-28 ENCOUNTER — Encounter: Payer: Self-pay | Admitting: Family Medicine

## 2021-10-28 DIAGNOSIS — I10 Essential (primary) hypertension: Secondary | ICD-10-CM

## 2021-10-29 MED ORDER — BENAZEPRIL HCL 10 MG PO TABS: 10.0000 mg | ORAL_TABLET | Freq: Every day | ORAL | 2 refills | Status: DC

## 2021-11-27 ENCOUNTER — Ambulatory Visit (INDEPENDENT_AMBULATORY_CARE_PROVIDER_SITE_OTHER): Payer: 59 | Admitting: Psychology

## 2021-11-27 DIAGNOSIS — F330 Major depressive disorder, recurrent, mild: Secondary | ICD-10-CM | POA: Diagnosis not present

## 2021-11-27 DIAGNOSIS — F419 Anxiety disorder, unspecified: Secondary | ICD-10-CM | POA: Diagnosis not present

## 2021-11-27 DIAGNOSIS — F900 Attention-deficit hyperactivity disorder, predominantly inattentive type: Secondary | ICD-10-CM

## 2021-11-27 DIAGNOSIS — R69 Illness, unspecified: Secondary | ICD-10-CM | POA: Diagnosis not present

## 2021-11-27 NOTE — Progress Notes (Signed)
[facility] Counselor/Therapist Progress Note  Patient ID: JANERA PEUGH, MRN: [id number],    Date: 11/27/2021  Time Spent: 60 minutes  Treatment Type: Individual Therapy  Reported Symptoms: sadness, anxiety  Mental Status Exam: Appearance:  Casual     Behavior: Appropriate  Motor: Normal  Speech/Language:  Normal Rate  Affect: Appropriate  Mood: normal  Thought process: normal  Thought content:   WNL  Sensory/Perceptual disturbances:   WNL  Orientation: oriented to person, place, time/date, and situation  Attention: Good  Concentration: Good  Memory: WNL  Fund of knowledge:  Good  Insight:   Good  Judgment:  Good  Impulse Control: Good   Risk Assessment: Danger to Self:  No Self-injurious Behavior: No Danger to Others: No Duty to Warn:no Physical Aggression / Violence:No  Access to Firearms a concern: No  Gang Involvement:No   Subjective: The patient attended a face-to-face individual therapy session via video visit today.  The patient gave consent for the video visit to be on web ex.  The patient was in her home and therapist was in the office.  The patient presents as pleasant and cooperative.  The patient reported that she was doing well and that her daughter is in her final trimester of pregnancy.  During the last 15 minutes of the session the patient brought up that she is having an affair.  We began talking about the concerns of having an affair and that she needs to do some soul searching about what it is that she wants to do for herself and whether she wants to try to work with her marriage of over 40 years.  I explained to her that she might want to have conversations with her husband so that he gets some idea about what he could do differently to help her feel better about the relationship.  She stated that she feels like his caregiver.  The patient wrote some things down and is planning to give it some thought. I told her that she could contact me  if she needs to if she is having any issues moving forward.  Interventions: Cognitive Behavioral Therapy, Insight-Oriented, Family Systems, and Interpersonal  Diagnosis:Major depressive disorder, recurrent episode, mild (HCC)  Attention deficit hyperactivity disorder (ADHD), predominantly inattentive type  Anxiety  Plan:Client Abilities/Strengths  Intelligent, insightful, supportive husband, sober  Client Treatment Preferences  Outpatient Individual therapy weekly or bi weekly  Client Statement of Needs  "I want to be happy with myself"  Treatment Level  Outpatient Individual therapy  Symptoms  Difficulty in saying no to others; assumes not being liked by others.: No Description Entered (Status:  improved). Fear of rejection by others, especially peer group.: No Description Entered (Status:  improved).  Problems Addressed  Low Self-Esteem, Low Self-Esteem, Low Self-Esteem  Goals 1. Demonstrate improved self-esteem through more pride in appearance,  more assertiveness, greater eye contact, and identification of  positive traits in self-talk messages. 2. Elevate self-esteem. 3. Establish an inward sense of self-worth, confidence, and competence. Objective Identify and replace negative self-talk messages used to reinforce low self-esteem. Target Date: 2022-07-07 Frequency: Weekly Progress: 80 Modality: individual Related Interventions 1. Help the client identify his/her distorted, negative beliefs about self and the world and replace  these messages with more realistic, affirmative messages (or assign "Journal and Replace SelfDefeating Thoughts" in the Adult Psychotherapy Homework Planner by [facility] or read What  to Say When You Talk to Yourself by [employer]). Objective Identify and engage in activities that would improve  self-image by being consistent with one's values. Target Date: 2022-07-07 Frequency: Weekly Progress: 80 Modality: individual Objective Demonstrate an  increased ability to identify and express personal feelings. Target Date: 2022-07-07 Frequency: Weekly Progress: 80 Modality: individual Diagnosis Axis none 296.31 (Major depressive affective  disorder, recurrent episode, mild) -   Adjustment  Disorder  Unspecified  Conditions For Discharge Achievement of treatment goals and objectives   Patient approved this treatment plan.  Penni Penado G Lunah Losasso, LCSW                  Emiah Pellicano G Catlyn Shipton, LCSW               Shawntina Diffee G Jacquelynne Guedes, LCSW               Kenna Seward G Farrin Shadle, LCSW               Talonda Artist G Giann Obara, LCSW               Ramla Hase G Kao Conry, LCSW

## 2021-12-06 ENCOUNTER — Encounter: Payer: Self-pay | Admitting: Family Medicine

## 2021-12-06 ENCOUNTER — Ambulatory Visit (INDEPENDENT_AMBULATORY_CARE_PROVIDER_SITE_OTHER): Payer: 59 | Admitting: Family Medicine

## 2021-12-06 VITALS — BP 130/80 | HR 81 | Temp 98.7°F | Ht 62.0 in | Wt 112.5 lb

## 2021-12-06 DIAGNOSIS — J0100 Acute maxillary sinusitis, unspecified: Secondary | ICD-10-CM

## 2021-12-06 MED ORDER — AZITHROMYCIN 250 MG PO TABS: ORAL_TABLET | ORAL | 0 refills | Status: AC

## 2021-12-06 MED ORDER — FLUCONAZOLE 150 MG PO TABS: ORAL_TABLET | ORAL | 0 refills | Status: DC

## 2021-12-06 MED ORDER — AZELASTINE-FLUTICASONE 137-50 MCG/ACT NA SUSP: NASAL | 11 refills | Status: AC

## 2021-12-06 MED ORDER — AZELASTINE HCL 0.05 % OP SOLN: 1.0000 [drp] | Freq: Two times a day (BID) | OPHTHALMIC | 11 refills | Status: AC

## 2021-12-06 NOTE — Progress Notes (Signed)
Salvatore Shear T. Doyne Ellinger, MD, CAQ Sports Medicine [facility] [address] [place], [address]  Phone: [phone]  FAX: [phone]  Tricia Munoz - 61 y.o. female  MRN [id number]  Date of Birth: 1961-03-08  Date: 12/06/2021  PCP: Lynnda Child, MD  Referral: Lynnda Child, MD  Chief Complaint  Patient presents with   Generalized Body Aches    X 2 weeks-Negative Covid Test on 12/05/21    Fatigue        Headache   Sore Throat   Adenopathy   Subjective:   Tricia Munoz is a 61 y.o. very pleasant female patient with Body mass index is 20.58 kg/m. who presents with the following:  Having a lot of congestion, HA, tired.  Throat has been sore, feels like a lump in her throat.  A lot of body aches.   She has generally been feeling sick for about 2 weeks with some diffuse body aches.  She also been tired and she has had a headache.  She does have some lymphadenopathy on the left side of her upper cervical chain as well as some sore throat.  She also does have an ulcer in the left upper posterior of her oropharynx near and adjacent to her denture.  She has not been febrile, she is eating and drinking okay.  She does have now some more focal maxillary sinus tenderness.  Covid negative yesterday  Review of Systems is noted in the HPI, as appropriate  Objective:   BP 130/80   Pulse 81   Temp 98.7 F (37.1 C) (Oral)   Ht 5\' 2"  (1.575 m)   Wt 112 lb 8 oz (51 kg)   LMP 11/06/2012   SpO2 95%   BMI 20.58 kg/m    Gen: WDWN, NAD; alert,appropriate and cooperative throughout exam  HEENT: Normocephalic and atraumatic. Throat clear, w/o exudate, no LAD, R TM clear, L TM - good landmarks, No fluid present. rhinnorhea.  Left frontal and maxillary sinuses: Tender, max Right frontal and maxillary sinuses: Tender, max  Neck: No ant or post LAD CV: RRR, No M/G/R Pulm: Breathing comfortably in no resp distress. no w/c/r Psych: full  affect, pleasant   Laboratory and Imaging Data:  Assessment and Plan:     ICD-10-CM   1. Acute non-recurrent maxillary sinusitis  J01.00      Refill basic allergy medicine and also give her a course of some Zithromax with ongoing current transition to sinusitis after viral syndrome.  Otherwise, continue supportive care.  Medication Management during today's office visit: Meds ordered this encounter  Medications   Azelastine-Fluticasone 137-50 MCG/ACT SUSP    Sig: 2 spray in each nostril daily    Dispense:  23 g    Refill:  11   azelastine (OPTIVAR) 0.05 % ophthalmic solution    Sig: Place 1 drop into both eyes 2 (two) times daily.    Dispense:  6 mL    Refill:  11   azithromycin (ZITHROMAX) 250 MG tablet    Sig: Take 2 tablets (500 mg total) by mouth daily for 1 day, THEN 1 tablet (250 mg total) daily for 4 days.    Dispense:  6 tablet    Refill:  0   fluconazole (DIFLUCAN) 150 MG tablet    Sig: Take 1 tab po today, repeat in 7 days if needed    Dispense:  2 tablet    Refill:  0   Medications  Discontinued During This Encounter  Medication Reason   azelastine (OPTIVAR) 0.05 % ophthalmic solution Completed Course   ketoconazole (NIZORAL) 2 % cream Completed Course   mupirocin ointment (BACTROBAN) 2 % Completed Course   diazepam (VALIUM) 5 MG tablet No longer needed (for PRN medications)   conjugated estrogens (PREMARIN) vaginal cream Completed Course   Azelastine-Fluticasone 137-50 MCG/ACT SUSP Reorder    Orders placed today for conditions managed today: No orders of the defined types were placed in this encounter.   Disposition: No follow-ups on file.  [employer] One speech-to-text software was used for transcription in this dictation.  Possible transcriptional errors can occur using Animal nutritionist.   Signed,  Elpidio Galea. Egan Berkheimer, MD   Outpatient Encounter Medications as of 12/06/2021  Medication Sig   albuterol (VENTOLIN HFA) 108 (90 Base) MCG/ACT inhaler  Inhale 2 puffs into the lungs every 4 (four) hours as needed for wheezing or shortness of breath.   azelastine (OPTIVAR) 0.05 % ophthalmic solution Place 1 drop into both eyes 2 (two) times daily.   azithromycin (ZITHROMAX) 250 MG tablet Take 2 tablets (500 mg total) by mouth daily for 1 day, THEN 1 tablet (250 mg total) daily for 4 days.   benazepril (LOTENSIN) 10 MG tablet Take 1 tablet (10 mg total) by mouth daily.   budesonide-formoterol (SYMBICORT) 80-4.5 MCG/ACT inhaler Inhale 2 puffs into the lungs in the morning and at bedtime.   Cholecalciferol (VITAMIN D) 2000 units CAPS Take by mouth.   fluconazole (DIFLUCAN) 150 MG tablet Take 1 tab po today, repeat in 7 days if needed   hydrochlorothiazide (HYDRODIURIL) 12.5 MG tablet TAKE 1 TABLET BY MOUTH EVERY DAY   levocetirizine (XYZAL) 5 MG tablet TAKE ONE TABLET BY MOUTH EACH EVENING   Multiple Vitamins-Minerals (WOMENS MULTIVITAMIN PO) Take by mouth.   simvastatin (ZOCOR) 20 MG tablet Take 2 tablets (40 mg total) by mouth at bedtime.   triazolam (HALCION) 0.25 MG tablet Prior to dental procedure   venlafaxine XR (EFFEXOR-XR) 150 MG 24 hr capsule TAKE 1 CAPSULE BY MOUTH DAILY WITH BREAKFAST.   vitamin C (ASCORBIC ACID) 250 MG tablet Take 250 mg by mouth daily.   [DISCONTINUED] Azelastine-Fluticasone 137-50 MCG/ACT SUSP 2 spray in each nostril daily   Azelastine-Fluticasone 137-50 MCG/ACT SUSP 2 spray in each nostril daily   [DISCONTINUED] azelastine (OPTIVAR) 0.05 % ophthalmic solution Apply 1 drop to eye 2 (two) times daily.   [DISCONTINUED] conjugated estrogens (PREMARIN) vaginal cream Place one applicator at bedtime twice weekly.   [DISCONTINUED] diazepam (VALIUM) 5 MG tablet Take 5 mg by mouth 2 (two) times daily as needed. (Patient not taking: Reported on 01/30/2021)   [DISCONTINUED] ketoconazole (NIZORAL) 2 % cream Apply 1 application topically daily.   [DISCONTINUED] mupirocin ointment (BACTROBAN) 2 % Apply 1 application topically 2  (two) times daily.   No facility-administered encounter medications on file as of 12/06/2021.

## 2022-01-04 ENCOUNTER — Ambulatory Visit: Payer: 59 | Admitting: Psychology

## 2022-02-07 ENCOUNTER — Ambulatory Visit (INDEPENDENT_AMBULATORY_CARE_PROVIDER_SITE_OTHER): Payer: 59 | Admitting: Psychology

## 2022-02-07 DIAGNOSIS — R69 Illness, unspecified: Secondary | ICD-10-CM | POA: Diagnosis not present

## 2022-02-07 DIAGNOSIS — Z630 Problems in relationship with spouse or partner: Secondary | ICD-10-CM

## 2022-02-07 DIAGNOSIS — F330 Major depressive disorder, recurrent, mild: Secondary | ICD-10-CM

## 2022-02-07 NOTE — Progress Notes (Signed)
[facility] Counselor/Therapist Progress Note  Patient ID: Tricia Munoz, MRN: [id number],    Date: 02/07/2022  Time Spent: 60 minutes  Treatment Type: Individual Therapy  Reported Symptoms: sadness, anxiety  Mental Status Exam: Appearance:  Casual     Behavior: Appropriate  Motor: Normal  Speech/Language:  Normal Rate  Affect: Appropriate  Mood: normal  Thought process: normal  Thought content:   WNL  Sensory/Perceptual disturbances:   WNL  Orientation: oriented to person, place, time/date, and situation  Attention: Good  Concentration: Good  Memory: WNL  Fund of knowledge:  Good  Insight:   Good  Judgment:  Good  Impulse Control: Good   Risk Assessment: Danger to Self:  No Self-injurious Behavior: No Danger to Others: No Duty to Warn:no Physical Aggression / Violence:No  Access to Firearms a concern: No  Gang Involvement:No   Subjective: The patient attended a face-to-face individual therapy session via video visit today.  The patient gave consent for the video visit to be on web ex.  The patient was in her home and therapist was in the office.  The patient presents as sad and depressed.  The patient reports that her daughter had her baby and wants a very happy exciting thing however she is very upset because her husband seems to not be doing well with his mental health or his health and is staying around the house and sitting on the porch most of the time and seems to be declining.  She reports that she is still involved in that extramarital situation and it seems that she is possibly responding to a lack of attention from her husband.  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  I explained to her that it is his responsibility to take care of himself and that she needs to set some limits and boundaries around what she is planning to do or not do.  I recommended  that she explore some things about herself and what she wants and we will process further when I talk to her but I also recommended that she confront the situation with her husband and explained that she is going to go on with her life and that he needs to be responsible for himself.  We will processed the situation with the relationship a little further the next time I speak with her and I made an appointment sooner than usual.   Interventions: Cognitive Behavioral Therapy, Insight-Oriented, Family Systems, and Interpersonal  Diagnosis:Major depressive disorder, recurrent episode, mild (HCC)  Marital conflict  Plan:Client Abilities/Strengths  Intelligent, insightful, supportive husband, sober  Client Treatment Preferences  Outpatient Individual therapy weekly or bi weekly  Client Statement of Needs  "I want to be happy with myself"  Treatment Level  Outpatient Individual therapy  Symptoms  Difficulty in saying no to others; assumes not being liked by others.: No Description Entered (Status:  improved). Fear of rejection by others, especially peer group.: No Description Entered (Status:  improved).  Problems Addressed  Low Self-Esteem, Low Self-Esteem, Low Self-Esteem  Goals 1. Demonstrate improved self-esteem through more pride in appearance,  more assertiveness, greater eye contact, and identification of  positive traits in self-talk messages. 2. Elevate self-esteem. 3. Establish an inward sense of self-worth, confidence, and competence. Objective Identify and replace negative self-talk messages used to reinforce low self-esteem. Target Date: 2022-07-07 Frequency: Weekly Progress: 80 Modality: individual Related Interventions  1. Help the client identify his/her distorted, negative beliefs about self and the world and replace  these messages with more realistic, affirmative messages (or assign "Journal and Replace SelfDefeating Thoughts" in the Adult Psychotherapy Homework Planner  by [facility] or read What  to Say When You Talk to Yourself by [employer]). Objective Identify and engage in activities that would improve self-image by being consistent with one's values. Target Date: 2022-07-07 Frequency: Weekly Progress: 80 Modality: individual Objective Demonstrate an increased ability to identify and express personal feelings. Target Date: 2022-07-07 Frequency: Weekly Progress: 80 Modality: individual Diagnosis Axis none 296.31 (Major depressive affective  disorder, recurrent episode, mild) -   Adjustment  Disorder  Unspecified  Conditions For Discharge Achievement of treatment goals and objectives   Patient approved this treatment plan.  Jeannine Pennisi G Torrance Frech, LCSW

## 2022-02-19 ENCOUNTER — Ambulatory Visit (INDEPENDENT_AMBULATORY_CARE_PROVIDER_SITE_OTHER): Payer: 59 | Admitting: Psychology

## 2022-02-19 DIAGNOSIS — F419 Anxiety disorder, unspecified: Secondary | ICD-10-CM

## 2022-02-19 DIAGNOSIS — F330 Major depressive disorder, recurrent, mild: Secondary | ICD-10-CM | POA: Diagnosis not present

## 2022-02-19 DIAGNOSIS — Z630 Problems in relationship with spouse or partner: Secondary | ICD-10-CM

## 2022-02-19 DIAGNOSIS — F900 Attention-deficit hyperactivity disorder, predominantly inattentive type: Secondary | ICD-10-CM

## 2022-02-19 DIAGNOSIS — R69 Illness, unspecified: Secondary | ICD-10-CM | POA: Diagnosis not present

## 2022-02-19 NOTE — Progress Notes (Signed)
[facility] Counselor/Therapist Progress Note  Patient ID: Tricia Munoz, MRN: [id number],    Date: 02/19/2022  Time Spent: 60 minutes  Treatment Type: Individual Therapy  Reported Symptoms: sadness, anxiety  Mental Status Exam: Appearance:  Casual     Behavior: Appropriate  Motor: Normal  Speech/Language:  Normal Rate  Affect: Appropriate  Mood: normal  Thought process: normal  Thought content:   WNL  Sensory/Perceptual disturbances:   WNL  Orientation: oriented to person, place, time/date, and situation  Attention: Good  Concentration: Good  Memory: WNL  Fund of knowledge:  Good  Insight:   Good  Judgment:  Good  Impulse Control: Good   Risk Assessment: Danger to Self:  No Self-injurious Behavior: No Danger to Others: No Duty to Warn:no Physical Aggression / Violence:No  Access to Firearms a concern: No  Gang Involvement:No   Subjective: The patient attended a face-to-face individual therapy session via video visit today.  The patient gave consent for the video visit to be on web ex.  The patient was in her home and therapist was in the office.  Patient reports that she is feeling a lot better today.  She did have a conversation with her husband and he seems to be making some changes.  We talked about codependency and how she has essentially helped create the circumstance that she is in right now.  She has always done everything around the house for him because he was working and now he does not really know how to do anything around the house himself.  I encouraged her to teach him how to do things so that he can learn how to be more independent and take care of himself.  I also explained to her that she cannot be mad at him for not doing anything because she is always done the things that she has done for him.  The patient understood the concepts discussed and we will continue to work with patient on codependency issues.  Interventions: Cognitive  Behavioral Therapy, Insight-Oriented, Family Systems, and Interpersonal  Diagnosis:Major depressive disorder, recurrent episode, mild ([place])  Marital conflict  Attention deficit hyperactivity disorder (ADHD), predominantly inattentive type  Anxiety  Plan:Client Abilities/Strengths  Intelligent, insightful, supportive husband, sober  Client Treatment Preferences  Outpatient Individual therapy weekly or bi weekly  Client Statement of Needs  "I want to be happy with myself"  Treatment Level  Outpatient Individual therapy  Symptoms  Difficulty in saying no to others; assumes not being liked by others.: No Description Entered (Status:  improved). Fear of rejection by others, especially peer group.: No Description Entered (Status:  improved).  Problems Addressed  Low Self-Esteem, Low Self-Esteem, Low Self-Esteem  Goals 1. Demonstrate improved self-esteem through more pride in appearance,  more assertiveness, greater eye contact, and identification of  positive traits in self-talk messages. 2. Elevate self-esteem. 3. Establish an inward sense of self-worth, confidence, and competence. Objective Identify and replace negative self-talk messages used to reinforce low self-esteem. Target Date: 2022-07-07 Frequency: Weekly Progress: 80 Modality: individual Related Interventions 1. Help the client identify his/her distorted, negative beliefs about self and the world and replace  these messages with more realistic, affirmative messages (or assign "Journal and Replace SelfDefeating Thoughts" in the Adult Psychotherapy Homework Planner by [facility] or read What  to Say When You Talk to Yourself by [employer]). Objective Identify and engage in activities that would improve self-image by being consistent with one's values. Target Date: 2022-07-07 Frequency: Weekly Progress: 80 Modality:  individual Objective Demonstrate an increased ability to identify and express personal feelings. Target  Date: 2022-07-07 Frequency: Weekly Progress: 80 Modality: individual Diagnosis Axis none 296.31 (Major depressive affective  disorder, recurrent episode, mild) -   Adjustment  Disorder  Unspecified  Conditions For Discharge Achievement of treatment goals and objectives   Patient approved this treatment plan.  Keil Pickering G Ciella Obi, LCSW

## 2022-02-28 ENCOUNTER — Encounter: Payer: Self-pay | Admitting: Family Medicine

## 2022-02-28 ENCOUNTER — Ambulatory Visit (INDEPENDENT_AMBULATORY_CARE_PROVIDER_SITE_OTHER): Payer: 59 | Admitting: Family Medicine

## 2022-02-28 VITALS — BP 100/64 | HR 92 | Temp 99.8°F | Ht 62.0 in | Wt 112.4 lb

## 2022-02-28 DIAGNOSIS — M25561 Pain in right knee: Secondary | ICD-10-CM | POA: Diagnosis not present

## 2022-02-28 NOTE — Progress Notes (Signed)
Earon Rivest T. Lonie Newsham, MD, CAQ Sports Medicine [facility] [address] [place], [address]  Phone: [phone]  FAX: [phone]  Tricia Munoz - 61 y.o. female  MRN [id number]  Date of Birth: 10-04-1960  Date: 02/28/2022  PCP: Gweneth Dimitri, MD  Referral: Gweneth Dimitri, MD  Chief Complaint  Patient presents with   Knee Pain    Twisted Right Knee about 2 days ago   Subjective:   Tricia Munoz is a 61 y.o. very pleasant female patient with Body mass index is 20.55 kg/m. who presents with the following:  Was walking her dog, and while on leaves - externally rotated her leg, and now her back also hurts.  Lateral knee hurts a lot.  Not swollen and not black or blue.    She has not had an effusion.  When she walks straightahead it does not really bother her that much but when she pivots and twists at all it really hurts quite a bit.  She has no major knee history, she does have excellent knee motion, as well.  Review of Systems is noted in the HPI, as appropriate  Objective:   BP 100/64   Pulse 92   Temp 99.8 F (37.7 C) (Oral)   Ht 5\' 2"  (1.575 m)   Wt 112 lb 6 oz (51 kg)   LMP 11/06/2012   SpO2 98%   BMI 20.55 kg/m   GEN: No acute distress; alert,appropriate. PULM: Breathing comfortably in no respiratory distress PSYCH: Normally interactive.   Full extension and flexion to 130.  No pain with movement of the patella.  Nontender with stress and MCL and LCL, any varus and valgus stresses nontender, but just proximal to the lateral joint line there is tenderness and tenderness along the IT band distally. No significant joint line tenderness.  Stable Lachman and stable drawer testing.  Forced flexion with twisting does cause pain, but no mechanical symptoms.  Laboratory and Imaging Data:  Assessment and Plan:     ICD-10-CM   1. Acute pain of right knee  M25.561      Iliotibial band strain.  Ligamentous structures are  intact and nontender.  I think that she will do well with time, but I am going to support her knee with a patellar J brace.  Think as long as she moves in a forward direction without rotational maneuvers this will heal without event.  Disposition: No follow-ups on file.  [employer] One speech-to-text software was used for transcription in this dictation.  Possible transcriptional errors can occur using 11/08/2012.   Signed,  Animal nutritionist. Cedrick Partain, MD   Outpatient Encounter Medications as of 02/28/2022  Medication Sig   albuterol (VENTOLIN HFA) 108 (90 Base) MCG/ACT inhaler Inhale 2 puffs into the lungs every 4 (four) hours as needed for wheezing or shortness of breath.   azelastine (OPTIVAR) 0.05 % ophthalmic solution Place 1 drop into both eyes 2 (two) times daily.   Azelastine-Fluticasone 137-50 MCG/ACT SUSP 2 spray in each nostril daily   benazepril (LOTENSIN) 10 MG tablet Take 1 tablet (10 mg total) by mouth daily.   budesonide-formoterol (SYMBICORT) 80-4.5 MCG/ACT inhaler Inhale 2 puffs into the lungs in the morning and at bedtime.   Cholecalciferol (VITAMIN D) 2000 units CAPS Take by mouth.   fluconazole (DIFLUCAN) 150 MG tablet Take 1 tab po today, repeat in 7 days if needed   hydrochlorothiazide (HYDRODIURIL) 12.5 MG tablet TAKE 1 TABLET BY  MOUTH EVERY DAY   levocetirizine (XYZAL) 5 MG tablet TAKE ONE TABLET BY MOUTH EACH EVENING   Multiple Vitamins-Minerals (WOMENS MULTIVITAMIN PO) Take by mouth.   simvastatin (ZOCOR) 20 MG tablet Take 2 tablets (40 mg total) by mouth at bedtime.   triazolam (HALCION) 0.25 MG tablet Prior to dental procedure   venlafaxine XR (EFFEXOR-XR) 150 MG 24 hr capsule TAKE 1 CAPSULE BY MOUTH DAILY WITH BREAKFAST.   vitamin C (ASCORBIC ACID) 250 MG tablet Take 250 mg by mouth daily.   No facility-administered encounter medications on file as of 02/28/2022.

## 2022-03-21 ENCOUNTER — Ambulatory Visit (INDEPENDENT_AMBULATORY_CARE_PROVIDER_SITE_OTHER): Payer: 59 | Admitting: Psychology

## 2022-03-21 DIAGNOSIS — F419 Anxiety disorder, unspecified: Secondary | ICD-10-CM

## 2022-03-21 DIAGNOSIS — R69 Illness, unspecified: Secondary | ICD-10-CM | POA: Diagnosis not present

## 2022-03-21 DIAGNOSIS — F330 Major depressive disorder, recurrent, mild: Secondary | ICD-10-CM | POA: Diagnosis not present

## 2022-03-21 DIAGNOSIS — Z630 Problems in relationship with spouse or partner: Secondary | ICD-10-CM | POA: Diagnosis not present

## 2022-03-21 DIAGNOSIS — F900 Attention-deficit hyperactivity disorder, predominantly inattentive type: Secondary | ICD-10-CM

## 2022-03-21 NOTE — Progress Notes (Signed)
[facility] Counselor/Therapist Progress Note  Patient ID: Tricia Munoz, MRN: [id number],    Date: 03/21/2022  Time Spent: 45 minutes  Treatment Type: Individual Therapy  Reported Symptoms: sadness, anxiety  Mental Status Exam: Appearance:  Casual     Behavior: Appropriate  Motor: Normal  Speech/Language:  Normal Rate  Affect: Appropriate  Mood: normal  Thought process: normal  Thought content:   WNL  Sensory/Perceptual disturbances:   WNL  Orientation: oriented to person, place, time/date, and situation  Attention: Good  Concentration: Good  Memory: WNL  Fund of knowledge:  Good  Insight:   Good  Judgment:  Good  Impulse Control: Good   Risk Assessment: Danger to Self:  No Self-injurious Behavior: No Danger to Others: No Duty to Warn:no Physical Aggression / Violence:No  Access to Firearms a concern: No  Gang Involvement:No   Subjective: The patient attended a face-to-face individual therapy session via video visit today.  The patient gave consent for the video visit to be on web ex.  The patient was in her home and therapist was in the office.  The patient presents as guarded and very distant.  She talked briefly about the Thanksgiving holiday that they had and also the birth of her new grandbaby.  The patient reported that her husband was home and she really did not feel comfortable talking today and would just briefly reviewed how she is taking care of herself and what she is doing with her AA group.  The patient did report that she was planning to start her own AA group online hopefully in the near future.  She scheduled another appointment in a few weeks because she felt prohibited by her husband being in the house. Interventions: Cognitive Behavioral Therapy, Insight-Oriented, Family Systems, and Interpersonal  Diagnosis:Major depressive disorder, recurrent episode, mild (HCC)  Marital conflict  Attention deficit hyperactivity disorder (ADHD),  predominantly inattentive type  Anxiety  Plan:Client Abilities/Strengths  Intelligent, insightful, supportive husband, sober  Client Treatment Preferences  Outpatient Individual therapy weekly or bi weekly  Client Statement of Needs  "I want to be happy with myself"  Treatment Level  Outpatient Individual therapy  Symptoms  Difficulty in saying no to others; assumes not being liked by others.: No Description Entered (Status:  improved). Fear of rejection by others, especially peer group.: No Description Entered (Status:  improved).  Problems Addressed  Low Self-Esteem, Low Self-Esteem, Low Self-Esteem  Goals 1. Demonstrate improved self-esteem through more pride in appearance,  more assertiveness, greater eye contact, and identification of  positive traits in self-talk messages. 2. Elevate self-esteem. 3. Establish an inward sense of self-worth, confidence, and competence. Objective Identify and replace negative self-talk messages used to reinforce low self-esteem. Target Date: 2022-07-07 Frequency: Weekly Progress: 80 Modality: individual Related Interventions 1. Help the client identify his/her distorted, negative beliefs about self and the world and replace  these messages with more realistic, affirmative messages (or assign "Journal and Replace SelfDefeating Thoughts" in the Adult Psychotherapy Homework Planner by [facility] or read What  to Say When You Talk to Yourself by [employer]). Objective Identify and engage in activities that would improve self-image by being consistent with one's values. Target Date: 2022-07-07 Frequency: Weekly Progress: 80 Modality: individual Objective Demonstrate an increased ability to identify and express personal feelings. Target Date: 2022-07-07 Frequency: Weekly Progress: 80 Modality: individual Diagnosis Axis none 296.31 (Major depressive affective  disorder, recurrent episode, mild) -   Adjustment  Disorder  Unspecified   Conditions For  Discharge Achievement of treatment goals and objectives   Patient approved this treatment plan.  Lorayne Getchell G Teia Freitas, LCSW

## 2022-04-04 ENCOUNTER — Ambulatory Visit: Payer: 59 | Admitting: Psychology

## 2022-04-04 DIAGNOSIS — F900 Attention-deficit hyperactivity disorder, predominantly inattentive type: Secondary | ICD-10-CM | POA: Diagnosis not present

## 2022-04-04 DIAGNOSIS — F419 Anxiety disorder, unspecified: Secondary | ICD-10-CM | POA: Diagnosis not present

## 2022-04-04 DIAGNOSIS — R69 Illness, unspecified: Secondary | ICD-10-CM | POA: Diagnosis not present

## 2022-04-04 DIAGNOSIS — Z630 Problems in relationship with spouse or partner: Secondary | ICD-10-CM

## 2022-04-04 DIAGNOSIS — F330 Major depressive disorder, recurrent, mild: Secondary | ICD-10-CM

## 2022-04-05 NOTE — Progress Notes (Signed)
[facility] Counselor/Therapist Progress Note  Patient ID: JONEL SICK, MRN: [id number],    Date: 04/04/2022  Time Spent: 45 minutes  Treatment Type: Individual Therapy  Reported Symptoms: sadness, anxiety  Mental Status Exam: Appearance:  Casual     Behavior: Appropriate  Motor: Normal  Speech/Language:  Normal Rate  Affect: Appropriate  Mood: normal  Thought process: normal  Thought content:   WNL  Sensory/Perceptual disturbances:   WNL  Orientation: oriented to person, place, time/date, and situation  Attention: Good  Concentration: Good  Memory: WNL  Fund of knowledge:  Good  Insight:   Good  Judgment:  Good  Impulse Control: Good   Risk Assessment: Danger to Self:  No Self-injurious Behavior: No Danger to Others: No Duty to Warn:no Physical Aggression / Violence:No  Access to Firearms a concern: No  Gang Involvement:No   Subjective: The patient attended a face-to-face individual therapy session via video visit today.  The patient gave consent for the video visit to be on web ex.  The patient was in her car and therapist was in the office.  The patient presents as pleasant and cooperative.  The patient was out Christmas shopping and took time to do the session.  The patient reports that she is still involved with the man that she has been seeing and they just got back from a weekend away.  We talked about the concern that the situation might get to the place where it might blow up.  I explained to the patient that it may be better for her to be proactive as opposed to be active.  We talked about her deciding what it is that she wants in life and giving her husband the opportunity possibly to meet her needs and if not then she might have a difficult decision to make.  The patient states that she wants to choose her family however she continues to have this relationship.  We talked about it being risky to continue to do what she has been doing.  The  patient is aware of this behavior and we will continue to process how she needs to move forward.    Interventions: Cognitive Behavioral Therapy, Insight-Oriented, Family Systems, and Interpersonal  Diagnosis:Major depressive disorder, recurrent episode, mild (HCC)  Marital conflict  Attention deficit hyperactivity disorder (ADHD), predominantly inattentive type  Anxiety  Plan:Client Abilities/Strengths  Intelligent, insightful, supportive husband, sober  Client Treatment Preferences  Outpatient Individual therapy weekly or bi weekly  Client Statement of Needs  "I want to be happy with myself"  Treatment Level  Outpatient Individual therapy  Symptoms  Difficulty in saying no to others; assumes not being liked by others.: No Description Entered (Status:  improved). Fear of rejection by others, especially peer group.: No Description Entered (Status:  improved).  Problems Addressed  Low Self-Esteem, Low Self-Esteem, Low Self-Esteem  Goals 1. Demonstrate improved self-esteem through more pride in appearance,  more assertiveness, greater eye contact, and identification of  positive traits in self-talk messages. 2. Elevate self-esteem. 3. Establish an inward sense of self-worth, confidence, and competence. Objective Identify and replace negative self-talk messages used to reinforce low self-esteem. Target Date: 2022-07-07 Frequency: Weekly Progress: 80 Modality: individual Related Interventions 1. Help the client identify his/her distorted, negative beliefs about self and the world and replace  these messages with more realistic, affirmative messages (or assign "Journal and Replace SelfDefeating Thoughts" in the Adult Psychotherapy Homework Planner by [facility] or read What  to Say When  You Talk to Yourself by [employer]). Objective Identify and engage in activities that would improve self-image by being consistent with one's values. Target Date: 2022-07-07 Frequency:  Weekly Progress: 80 Modality: individual Objective Demonstrate an increased ability to identify and express personal feelings. Target Date: 2022-07-07 Frequency: Weekly Progress: 80 Modality: individual Diagnosis Axis none 296.31 (Major depressive affective  disorder, recurrent episode, mild) -   Adjustment  Disorder  Unspecified  Conditions For Discharge Achievement of treatment goals and objectives   Patient approved this treatment plan.  Dalaney Needle G Tiran Sauseda, LCSW

## 2022-04-09 ENCOUNTER — Other Ambulatory Visit: Payer: Self-pay

## 2022-04-09 DIAGNOSIS — F32A Depression, unspecified: Secondary | ICD-10-CM

## 2022-04-09 MED ORDER — VENLAFAXINE HCL ER 150 MG PO CP24: 150.0000 mg | ORAL_CAPSULE | Freq: Every day | ORAL | 2 refills | Status: DC

## 2022-04-09 NOTE — Telephone Encounter (Signed)
Received refill request for     TOC with Dr. Ermalene Searing 10/08/22

## 2022-04-26 ENCOUNTER — Ambulatory Visit: Payer: 59 | Admitting: Psychology

## 2022-06-04 ENCOUNTER — Other Ambulatory Visit: Payer: Self-pay | Admitting: Family

## 2022-06-04 DIAGNOSIS — F32A Depression, unspecified: Secondary | ICD-10-CM

## 2022-06-05 ENCOUNTER — Other Ambulatory Visit: Payer: Self-pay

## 2022-06-05 MED ORDER — SIMVASTATIN 20 MG PO TABS: 40.0000 mg | ORAL_TABLET | Freq: Every day | ORAL | 1 refills | Status: DC

## 2022-06-06 ENCOUNTER — Ambulatory Visit: Payer: 59 | Admitting: Family Medicine

## 2022-06-06 ENCOUNTER — Encounter: Payer: Self-pay | Admitting: Family Medicine

## 2022-06-06 VITALS — BP 136/72 | HR 77 | Temp 97.9°F | Ht 62.0 in | Wt 109.0 lb

## 2022-06-06 DIAGNOSIS — N898 Other specified noninflammatory disorders of vagina: Secondary | ICD-10-CM | POA: Diagnosis not present

## 2022-06-06 DIAGNOSIS — R300 Dysuria: Secondary | ICD-10-CM | POA: Diagnosis not present

## 2022-06-06 LAB — POC URINALSYSI DIPSTICK (AUTOMATED)
Bilirubin, UA: NEGATIVE
Blood, UA: NEGATIVE
Glucose, UA: NEGATIVE
Ketones, UA: NEGATIVE
Leukocytes, UA: NEGATIVE
Nitrite, UA: NEGATIVE
Protein, UA: NEGATIVE
Spec Grav, UA: 1.01 (ref 1.010–1.025)
Urobilinogen, UA: 0.2 E.U./dL
pH, UA: 6 (ref 5.0–8.0)

## 2022-06-06 NOTE — Assessment & Plan Note (Signed)
Acute acute Urinalysis clear and symptoms reflect vaginal etiology as opposed to urinary tract infection. Possible vaginal candidiasis which she is currently treating with Monistat, but given possibility of BV we will send out a wet prep to evaluate further.  Encouraged her to push fluids and continue Monistat until wet prep results return.

## 2022-06-06 NOTE — Progress Notes (Signed)
Patient ID: Tricia Munoz, female    DOB: Jun 30, 1960, 62 y.o.   MRN: [id number]  This visit was conducted in person.  BP 136/72   Pulse 77   Temp 97.9 F (36.6 C) (Temporal)   Ht 5' 2"$  (1.575 m)   Wt 109 lb (49.4 kg)   LMP 11/06/2012   SpO2 97%   BMI 19.94 kg/m    CC:  Chief Complaint  Patient presents with   Dysuria    C/o burning when urinating. Sxs started 06/03/22.     Subjective:   HPI: Tricia Munoz is a 62 y.o. female presenting on 06/06/2022 for Dysuria (C/o burning when urinating. Sxs started 06/03/22. )  Dysuria  This is a new problem. The current episode started in the past 7 days. The problem occurs every urination. The problem has been gradually improving. The quality of the pain is described as burning. The pain is moderate. There has been no fever. Associated symptoms include a discharge. Pertinent negatives include no chills, flank pain, frequency, hematuria, nausea, sweats, urgency or vomiting. Associated symptoms comments:  Some vaginal itching, odor and discharge mild. Treatments tried: monistat. The treatment provided moderate relief. There is no history of catheterization, kidney stones, recurrent UTIs, a single kidney, urinary stasis or a urological procedure.         Relevant past medical, surgical, family and social history reviewed and updated as indicated. Interim medical history since our last visit reviewed. Allergies and medications reviewed and updated. Outpatient Medications Prior to Visit  Medication Sig Dispense Refill   albuterol (VENTOLIN HFA) 108 (90 Base) MCG/ACT inhaler Inhale 2 puffs into the lungs every 4 (four) hours as needed for wheezing or shortness of breath. 18 g 2   azelastine (OPTIVAR) 0.05 % ophthalmic solution Place 1 drop into both eyes 2 (two) times daily. 6 mL 11   Azelastine-Fluticasone 137-50 MCG/ACT SUSP 2 spray in each nostril daily 23 g 11   benazepril (LOTENSIN) 10 MG tablet Take 1 tablet (10 mg total) by mouth  daily. 90 tablet 2   budesonide-formoterol (SYMBICORT) 80-4.5 MCG/ACT inhaler Inhale 2 puffs into the lungs in the morning and at bedtime. 1 each 0   Cholecalciferol (VITAMIN D) 2000 units CAPS Take by mouth.     fluconazole (DIFLUCAN) 150 MG tablet Take 1 tab po today, repeat in 7 days if needed 2 tablet 0   hydrochlorothiazide (HYDRODIURIL) 12.5 MG tablet TAKE 1 TABLET BY MOUTH EVERY DAY 90 tablet 3   levocetirizine (XYZAL) 5 MG tablet TAKE ONE TABLET BY MOUTH EACH EVENING 30 tablet 11   Multiple Vitamins-Minerals (WOMENS MULTIVITAMIN PO) Take by mouth.     simvastatin (ZOCOR) 20 MG tablet Take 2 tablets (40 mg total) by mouth at bedtime. 180 tablet 1   triazolam (HALCION) 0.25 MG tablet Prior to dental procedure     venlafaxine XR (EFFEXOR-XR) 150 MG 24 hr capsule Take 1 capsule (150 mg total) by mouth daily with breakfast. 90 capsule 2   vitamin C (ASCORBIC ACID) 250 MG tablet Take 250 mg by mouth daily.     No facility-administered medications prior to visit.     Per HPI unless specifically indicated in ROS section below Review of Systems  Constitutional:  Negative for chills.  Gastrointestinal:  Negative for nausea and vomiting.  Genitourinary:  Positive for dysuria. Negative for flank pain, frequency, hematuria and urgency.   Objective:  BP 136/72   Pulse 77   Temp 97.9 F (36.6  C) (Temporal)   Ht 5' 2"$  (1.575 m)   Wt 109 lb (49.4 kg)   LMP 11/06/2012   SpO2 97%   BMI 19.94 kg/m   Wt Readings from Last 3 Encounters:  06/06/22 109 lb (49.4 kg)  02/28/22 112 lb 6 oz (51 kg)  12/06/21 112 lb 8 oz (51 kg)      Physical Exam Constitutional:      General: She is not in acute distress.    Appearance: She is well-developed. She is not ill-appearing or toxic-appearing.  HENT:     Head: Normocephalic.     Right Ear: Hearing, tympanic membrane, ear canal and external ear normal. Tympanic membrane is not erythematous, retracted or bulging.     Left Ear: Hearing, tympanic  membrane, ear canal and external ear normal. Tympanic membrane is not erythematous, retracted or bulging.     Nose: Mucosal edema present. No rhinorrhea.     Right Sinus: No maxillary sinus tenderness or frontal sinus tenderness.     Left Sinus: No maxillary sinus tenderness or frontal sinus tenderness.     Mouth/Throat:     Pharynx: Uvula midline.  Eyes:     General: Lids are normal. Lids are everted, no foreign bodies appreciated.     Conjunctiva/sclera: Conjunctivae normal.     Pupils: Pupils are equal, round, and reactive to light.  Neck:     Thyroid: No thyroid mass or thyromegaly.     Vascular: No carotid bruit.     Trachea: Trachea normal.  Cardiovascular:     Rate and Rhythm: Normal rate and regular rhythm.     Pulses: Normal pulses.     Heart sounds: Normal heart sounds, S1 normal and S2 normal. No murmur heard.    No friction rub. No gallop.  Pulmonary:     Effort: Pulmonary effort is normal. No tachypnea or respiratory distress.     Breath sounds: Normal breath sounds. No decreased breath sounds, wheezing, rhonchi or rales.  Abdominal:     General: Abdomen is flat.     Palpations: Abdomen is rigid.     Tenderness: There is no abdominal tenderness.  Musculoskeletal:     Cervical back: Normal range of motion and neck supple.  Skin:    General: Skin is warm and dry.     Findings: No rash.  Neurological:     Mental Status: She is alert.  Psychiatric:        Mood and Affect: Mood is not anxious or depressed.        Speech: Speech normal.        Behavior: Behavior normal. Behavior is cooperative.        Judgment: Judgment normal.       Results for orders placed or performed in visit on 06/06/22  POCT Urinalysis Dipstick (Automated)  Result Value Ref Range   Color, UA yellow    Clarity, UA clear    Glucose, UA Negative Negative   Bilirubin, UA negative    Ketones, UA negative    Spec Grav, UA 1.010 1.010 - 1.025   Blood, UA negative    pH, UA 6.0 5.0 - 8.0    Protein, UA Negative Negative   Urobilinogen, UA 0.2 0.2 or 1.0 E.U./dL   Nitrite, UA negative    Leukocytes, UA Negative Negative    Assessment and Plan  Dysuria Assessment & Plan: Acute acute Urinalysis clear and symptoms reflect vaginal etiology as opposed to urinary tract infection. Possible vaginal candidiasis which  she is currently treating with Monistat, but given possibility of BV we will send out a wet prep to evaluate further.  Encouraged her to push fluids and continue Monistat until wet prep results return.  Orders: -     POCT Urinalysis Dipstick (Automated)  Vaginal discharge -     WET PREP BY MOLECULAR PROBE    No follow-ups on file.   Eliezer Lofts, MD

## 2022-06-07 ENCOUNTER — Other Ambulatory Visit: Payer: Self-pay

## 2022-06-07 DIAGNOSIS — I10 Essential (primary) hypertension: Secondary | ICD-10-CM

## 2022-06-07 LAB — WET PREP BY MOLECULAR PROBE
Candida species: NOT DETECTED
Gardnerella vaginalis: NOT DETECTED
MICRO NUMBER:: 14601226
SPECIMEN QUALITY:: ADEQUATE
Trichomonas vaginosis: NOT DETECTED

## 2022-06-07 MED ORDER — BENAZEPRIL HCL 10 MG PO TABS: 10.0000 mg | ORAL_TABLET | Freq: Every day | ORAL | 1 refills | Status: DC

## 2022-06-07 NOTE — Telephone Encounter (Signed)
Prescription Request  06/07/2022  Is this a "Controlled Substance" medicine? No  LOV: 06/21/2021 CPX with Dr Einar Pheasant; acute visit with Dr Diona Browner on 06/06/2022  What is the name of the medication or equipment? Benazepril 10 mg Last filled # 90 x 2 on 10/29/2021 [facility]  Have you contacted your pharmacy to request a refill? Yes   Which pharmacy would you like this sent to?  [id number] [id number] [place] [address]Phone: [phone]: [phone]   Patient notified that their request is being sent to the clinical staff for review and that they should receive a response within 2 business days.   Please advise at  [facility]    Pt has TOC already scheduled for 10/08/22.  Since pt just saw Dr BDiona Browneron 06/06/22 for acute visit will send refill request to Dr BDiona Browner

## 2022-06-09 ENCOUNTER — Encounter: Payer: Self-pay | Admitting: Family Medicine

## 2022-06-10 ENCOUNTER — Encounter: Payer: Self-pay | Admitting: Family

## 2022-06-10 ENCOUNTER — Ambulatory Visit: Payer: 59 | Admitting: Family

## 2022-06-10 VITALS — BP 130/82 | HR 72 | Temp 98.0°F | Ht 62.0 in | Wt 110.8 lb

## 2022-06-10 DIAGNOSIS — K645 Perianal venous thrombosis: Secondary | ICD-10-CM

## 2022-06-10 MED ORDER — HYDROCORTISONE (PERIANAL) 2.5 % EX CREA: 1.0000 | TOPICAL_CREAM | Freq: Two times a day (BID) | CUTANEOUS | 0 refills | Status: DC

## 2022-06-10 NOTE — Patient Instructions (Addendum)
  Regards,   Nicolaus Andel FNP-C  

## 2022-06-10 NOTE — Progress Notes (Signed)
Established Patient Office Visit  Subjective:   Patient ID: Tricia Munoz, female    DOB: 10/09/1960  Age: 62 y.o. MRN: [id number]  CC:  Chief Complaint  Patient presents with   Rash    HPI: Tricia Munoz is a 62 y.o. female presenting on 06/10/2022 for Rash     Rash    Five days ago started with a rash on her butt, in the rectal area and in the butt crack. She thinks she might be allergic to her wipes. She states the rash is itching. Also has hemorrhoids, also with tenderness around them and can palpate them herself. No blood in stool. She did self treat a yeast infection as well last week which those symptoms have resolved.         ROS: Negative unless specifically indicated above in HPI.   Relevant past medical history reviewed and updated as indicated.   Allergies and medications reviewed and updated.   Current Outpatient Medications:    albuterol (VENTOLIN HFA) 108 (90 Base) MCG/ACT inhaler, Inhale 2 puffs into the lungs every 4 (four) hours as needed for wheezing or shortness of breath., Disp: 18 g, Rfl: 2   azelastine (OPTIVAR) 0.05 % ophthalmic solution, Place 1 drop into both eyes 2 (two) times daily., Disp: 6 mL, Rfl: 11   Azelastine-Fluticasone 137-50 MCG/ACT SUSP, 2 spray in each nostril daily, Disp: 23 g, Rfl: 11   benazepril (LOTENSIN) 10 MG tablet, Take 1 tablet (10 mg total) by mouth daily., Disp: 90 tablet, Rfl: 1   budesonide-formoterol (SYMBICORT) 80-4.5 MCG/ACT inhaler, Inhale 2 puffs into the lungs in the morning and at bedtime., Disp: 1 each, Rfl: 0   Cholecalciferol (VITAMIN D) 2000 units CAPS, Take by mouth., Disp: , Rfl:    fluconazole (DIFLUCAN) 150 MG tablet, Take 1 tab po today, repeat in 7 days if needed, Disp: 2 tablet, Rfl: 0   hydrochlorothiazide (HYDRODIURIL) 12.5 MG tablet, TAKE 1 TABLET BY MOUTH EVERY DAY, Disp: 90 tablet, Rfl: 3   hydrocortisone (PROCTO-MED HC) 2.5 % rectal cream, Place 1 Application rectally 2 (two) times  daily., Disp: 30 g, Rfl: 0   levocetirizine (XYZAL) 5 MG tablet, TAKE ONE TABLET BY MOUTH EACH EVENING, Disp: 30 tablet, Rfl: 11   Multiple Vitamins-Minerals (WOMENS MULTIVITAMIN PO), Take by mouth., Disp: , Rfl:    simvastatin (ZOCOR) 20 MG tablet, Take 2 tablets (40 mg total) by mouth at bedtime., Disp: 180 tablet, Rfl: 1   triazolam (HALCION) 0.25 MG tablet, Prior to dental procedure, Disp: , Rfl:    venlafaxine XR (EFFEXOR-XR) 150 MG 24 hr capsule, Take 1 capsule (150 mg total) by mouth daily with breakfast., Disp: 90 capsule, Rfl: 2   vitamin C (ASCORBIC ACID) 250 MG tablet, Take 250 mg by mouth daily., Disp: , Rfl:   Allergies  Allergen Reactions   Evekeo [Amphetamine Sulfate]    Latex Hives   Montelukast     itch   Singulair [Montelukast Sodium] Rash    Objective:   BP 130/82   Pulse 72   Temp 98 F (36.7 C) (Temporal)   Ht '5\' 2"'$  (1.575 m)   Wt 110 lb 12.8 oz (50.3 kg)   LMP 11/06/2012   SpO2 98%   BMI 20.27 kg/m    Physical Exam Genitourinary:    Rectum: External hemorrhoid (slight with erythema , tender to touch not thrombosed) present.     Comments: Slight erythematic rash surrounding rectal area     Assessment &  Plan:  External hemorrhoid, thrombosed Assessment & Plan: Rx procto med for hemorrhoids.  Advised sitz baths.  Hand out given to patient upon leaving  Advised if increasing pain or blue in color we may have to seek more urgent care for banding of hemorrhoid.   Orders: -     Hydrocortisone (Perianal); Place 1 Application rectally 2 (two) times daily.  Dispense: 30 g; Refill: 0     Follow up plan: Return for f/u with primary care provider if no improvement.  Eugenia Pancoast, FNP

## 2022-06-10 NOTE — Telephone Encounter (Signed)
Spoke to patient by telephone and appointment scheduled today 06/10/22 with Eugenia Pancoast FNP.

## 2022-06-10 NOTE — Assessment & Plan Note (Signed)
Rx procto med for hemorrhoids.  Advised sitz baths.  Hand out given to patient upon leaving  Advised if increasing pain or blue in color we may have to seek more urgent care for banding of hemorrhoid.

## 2022-06-24 ENCOUNTER — Encounter: Payer: 59 | Admitting: Family Medicine

## 2022-07-09 ENCOUNTER — Encounter: Payer: Self-pay | Admitting: Family Medicine

## 2022-07-15 ENCOUNTER — Ambulatory Visit: Payer: 59 | Admitting: Family Medicine

## 2022-07-15 ENCOUNTER — Encounter: Payer: Self-pay | Admitting: Family Medicine

## 2022-07-15 VITALS — BP 134/72 | HR 61 | Temp 97.8°F | Ht 62.0 in | Wt 112.0 lb

## 2022-07-15 DIAGNOSIS — W57XXXA Bitten or stung by nonvenomous insect and other nonvenomous arthropods, initial encounter: Secondary | ICD-10-CM | POA: Insufficient documentation

## 2022-07-15 DIAGNOSIS — S30861A Insect bite (nonvenomous) of abdominal wall, initial encounter: Secondary | ICD-10-CM | POA: Diagnosis not present

## 2022-07-15 NOTE — Patient Instructions (Signed)
Use insect repellent ofr insect repellent clothing   Check for ticks frequently   Lab today   I think the remaining spots are scar reaction to bites    Let us know if you develop a fever or other symptoms

## 2022-07-15 NOTE — Progress Notes (Signed)
Subjective:    Patient ID: Tricia Munoz, female    DOB: 1960/05/20, 62 y.o.   MRN: [id number]  HPI 62 yo pt of Dr Einar Pheasant presents for tick bite  Also some other skin concerns  She will be transferring care to Dr Welton Flakes Readings from Last 3 Encounters:  07/15/22 112 lb (50.8 kg)  06/10/22 110 lb 12.8 oz (50.3 kg)  06/06/22 109 lb (49.4 kg)   20.49 kg/m  Vitals:   07/15/22 1115  BP: 134/72  Pulse: 61  Temp: 97.8 F (36.6 C)  SpO2: 96%     Had a tick bite on feb 26th  Under L breast and then near umbilicus   Lone star tick Not engorged , not there for very long  Not deer ticks  No fever Did not feel sick No rash elsewhere  Is a little more fatigued than usual    Stayed red for a while   Still a bump there  Had some little bumps around it and they itched   ? 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 No pertinent surgical history. Social History   Tobacco Use   Smoking status: Never    Smokeless tobacco: Never  Vaping Use   Vaping Use: Never used  Substance Use Topics   Alcohol use: No   Drug use: No   Family History  Problem Relation Age of Onset   Dementia Mother    Rheum arthritis Mother    Hypertension Father    Allergic rhinitis Father    Colon cancer Father    Lung cancer Father    Kidney failure Sister        s/p transplant   Breast cancer Maternal Aunt 78       1/2 sister   Angioedema Neg Hx    Asthma Neg Hx    Eczema Neg Hx    Immunodeficiency Neg Hx    Urticaria Neg Hx    Allergies  Allergen Reactions   Evekeo [Amphetamine Sulfate]    Latex Hives   Montelukast     itch   Singulair [Montelukast Sodium] Rash   Current Outpatient Medications on File Prior to Visit  Medication Sig Dispense Refill   albuterol (VENTOLIN HFA) 108 (90 Base) MCG/ACT inhaler Inhale 2 puffs into the lungs every 4 (four) hours as needed for wheezing or shortness of breath. 18 g 2   azelastine (OPTIVAR) 0.05 % ophthalmic solution Place 1 drop into  both eyes 2 (two) times daily. 6 mL 11   Azelastine-Fluticasone 137-50 MCG/ACT SUSP 2 spray in each nostril daily 23 g 11   benazepril (LOTENSIN) 10 MG tablet Take 1 tablet (10 mg total) by mouth daily. 90 tablet 1   budesonide-formoterol (SYMBICORT) 80-4.5 MCG/ACT inhaler Inhale 2 puffs into the lungs in the morning and at bedtime. 1 each 0   Cholecalciferol (VITAMIN D) 2000 units CAPS Take by mouth.     fluconazole (DIFLUCAN) 150 MG tablet Take 1 tab po today, repeat in 7 days if needed 2 tablet 0   hydrochlorothiazide (HYDRODIURIL) 12.5 MG tablet TAKE 1 TABLET BY MOUTH EVERY DAY 90 tablet 3   hydrocortisone (PROCTO-MED HC) 2.5 % rectal cream Place 1 Application rectally 2 (two) times daily. 30 g 0   levocetirizine (XYZAL) 5 MG tablet TAKE ONE TABLET BY MOUTH EACH EVENING 30 tablet 11   Multiple Vitamins-Minerals (WOMENS MULTIVITAMIN PO) Take by mouth.     simvastatin (ZOCOR) 20 MG tablet Take 2 tablets (40 mg total) by  mouth at bedtime. 180 tablet 1   triazolam (HALCION) 0.25 MG tablet Prior to dental procedure     venlafaxine XR (EFFEXOR-XR) 150 MG 24 hr capsule Take 1 capsule (150 mg total) by mouth daily with breakfast. 90 capsule 2   vitamin C (ASCORBIC ACID) 250 MG tablet Take 250 mg by mouth daily.     No current facility-administered medications on file prior to visit.     Review of Systems  Constitutional:  Positive for fatigue. Negative for activity change, appetite change and fever.  HENT:  Negative for congestion.   Respiratory:  Negative for cough and shortness of breath.   Gastrointestinal:  Negative for abdominal distention, abdominal pain and nausea.  Musculoskeletal:  Negative for arthralgias.  Skin:        Bumps Itching   From tick bites  Neurological:  Negative for headaches.       Objective:   Physical Exam Constitutional:      General: She is not in acute distress.    Appearance: Normal appearance. She is normal weight. She is not ill-appearing.  HENT:     Head: Normocephalic and atraumatic.     Right Ear: External ear normal.     Left Ear: External ear normal.     Nose: Nose normal.  Eyes:     General:        Right eye: No discharge.        Left eye: No discharge.     Pupils: Pupils are equal, round, and reactive to light.  Cardiovascular:     Rate and Rhythm: Normal rate and regular rhythm.     Heart sounds: Normal heart sounds.  Pulmonary:     Effort: Pulmonary effort is normal.     Breath sounds: Normal breath sounds. No wheezing or rales.  Abdominal:     General: Abdomen is flat.     Palpations: Abdomen is soft. There is no mass.  Musculoskeletal:     Cervical back: Neck supple.  Lymphadenopathy:     Cervical: No cervical adenopathy.  Skin:    Comments: 0.5 cm bump- under left breast Also lateral to umbilicus on L  Pale in color  Dimples when squeezed- resembling dermatofibroma  Dry skin  Few lentigines   Few excoriations on abd and sides    Neurological:     Mental Status: She is alert.  Psychiatric:        Mood and Affect:  Mood normal.           Assessment & Plan:   Problem List Items Addressed This Visit       Musculoskeletal and Integument   Tick bite of abdomen - Primary    Pt has 2 small lumps on L abdomen (under L breast and also lateral to umbilicus) They resemble dermatofibromas (? If from scratching bites)  This was from feb  No acute illness/fever  Had some bumps on abd but no rash  Is feeling tired lately  Will go ahead with tick labs  Enc not to scratch the bumps / also watch for signs of infection   Counseled on prev of tick bites by use of insect repellent and insect repellent treated clothing        Relevant Orders   B. burgdorfi antibodies by WB   Rocky mtn spotted fvr abs pnl(IgG+IgM)

## 2022-07-15 NOTE — Assessment & Plan Note (Signed)
Pt has 2 small lumps on L abdomen (under L breast and also lateral to umbilicus) They resemble dermatofibromas (? If from scratching bites)  This was from feb  No acute illness/fever  Had some bumps on abd but no rash  Is feeling tired lately  Will go ahead with tick labs  Enc not to scratch the bumps / also watch for signs of infection   Counseled on prev of tick bites by use of insect repellent and insect repellent treated clothing

## 2022-07-18 LAB — B. BURGDORFI ANTIBODIES BY WB
B burgdorferi IgG Abs (IB): NEGATIVE
B burgdorferi IgM Abs (IB): NEGATIVE
Lyme Disease 18 kD IgG: NONREACTIVE
Lyme Disease 23 kD IgG: NONREACTIVE
Lyme Disease 23 kD IgM: NONREACTIVE
Lyme Disease 28 kD IgG: NONREACTIVE
Lyme Disease 30 kD IgG: NONREACTIVE
Lyme Disease 39 kD IgG: NONREACTIVE
Lyme Disease 39 kD IgM: REACTIVE — AB
Lyme Disease 41 kD IgG: NONREACTIVE
Lyme Disease 41 kD IgM: NONREACTIVE
Lyme Disease 45 kD IgG: NONREACTIVE
Lyme Disease 58 kD IgG: NONREACTIVE
Lyme Disease 66 kD IgG: NONREACTIVE
Lyme Disease 93 kD IgG: NONREACTIVE

## 2022-07-18 LAB — ROCKY MTN SPOTTED FVR ABS PNL(IGG+IGM)
RMSF IgG: NOT DETECTED
RMSF IgM: NOT DETECTED

## 2022-07-22 ENCOUNTER — Encounter: Payer: Self-pay | Admitting: Family Medicine

## 2022-09-04 ENCOUNTER — Ambulatory Visit: Payer: 59 | Admitting: Family Medicine

## 2022-09-04 ENCOUNTER — Encounter: Payer: Self-pay | Admitting: Family Medicine

## 2022-09-04 VITALS — BP 120/82 | HR 83 | Temp 97.9°F | Ht 62.0 in | Wt 109.0 lb

## 2022-09-04 DIAGNOSIS — R300 Dysuria: Secondary | ICD-10-CM | POA: Diagnosis not present

## 2022-09-04 LAB — POC URINALSYSI DIPSTICK (AUTOMATED)
Bilirubin, UA: NEGATIVE
Blood, UA: 10
Glucose, UA: NEGATIVE
Ketones, UA: NEGATIVE
Nitrite, UA: NEGATIVE
Protein, UA: NEGATIVE
Spec Grav, UA: 1.01 (ref 1.010–1.025)
Urobilinogen, UA: 0.2 E.U./dL
pH, UA: 7.5 (ref 5.0–8.0)

## 2022-09-04 MED ORDER — SULFAMETHOXAZOLE-TRIMETHOPRIM 800-160 MG PO TABS: 1.0000 | ORAL_TABLET | Freq: Two times a day (BID) | ORAL | 0 refills | Status: DC

## 2022-09-04 NOTE — Progress Notes (Signed)
Hello I am good how you know okay that is fine I am actually headed out of from work right now so I will be there probably for half an hour but there is a lock box this does not okay alright I will see you later  The back   Patient ID: Tricia Munoz, female    DOB: 1960/08/20, 62 y.o.   MRN: [id number]  This visit was conducted in person.  BP 120/82 (BP Location: Left Arm, Patient Position: Sitting, Cuff Size: Normal)   Pulse 83   Temp 97.9 F (36.6 C) (Temporal)   Ht 5\' 2"  (1.575 m)   Wt 109 lb (49.4 kg)   LMP 11/06/2012   SpO2 98%   BMI 19.94 kg/m    CC:  Chief Complaint  Patient presents with   Dysuria    Pressure, lower back pain, burning sensations, urgency and freq since yesterday.     Subjective:   HPI: Tricia Munoz is a 62 y.o. female presenting on 09/04/2022 for Dysuria (Pressure, lower back pain, burning sensations, urgency and freq since yesterday. )  Dysuria  This is a new problem. The current episode started yesterday. The problem has been gradually worsening. The quality of the pain is described as burning. The pain is moderate. There has been no fever. She is Not sexually active. There is No history of pyelonephritis. Associated symptoms include frequency and urgency. Pertinent negatives include no chills, flank pain, hematuria, nausea, possible pregnancy, sweats or vomiting. Associated symptoms comments: Low back pain, bladder pressure. She has tried increased fluids for the symptoms. The treatment provided mild relief. There is no history of catheterization, kidney stones, recurrent UTIs, a single kidney or a urological procedure.         Relevant past medical, surgical, family and social history reviewed and updated as indicated. Interim medical history since our last visit reviewed. Allergies and medications reviewed and updated. Outpatient Medications Prior to Visit  Medication Sig Dispense Refill   albuterol (VENTOLIN HFA) 108 (90 Base) MCG/ACT  inhaler Inhale 2 puffs into the lungs every 4 (four) hours as needed for wheezing or shortness of breath. 18 g 2   azelastine (OPTIVAR) 0.05 % ophthalmic solution Place 1 drop into both eyes 2 (two) times daily. 6 mL 11   Azelastine-Fluticasone 137-50 MCG/ACT SUSP 2 spray in each nostril daily 23 g 11   benazepril (LOTENSIN) 10 MG tablet Take 1 tablet (10 mg total) by mouth daily. 90 tablet 1   budesonide-formoterol (SYMBICORT) 80-4.5 MCG/ACT inhaler Inhale 2 puffs into the lungs in the morning and at bedtime. 1 each 0   Cholecalciferol (VITAMIN D) 2000 units CAPS Take by mouth.     fluconazole (DIFLUCAN) 150 MG tablet Take 1 tab po today, repeat in 7 days if needed 2 tablet 0   hydrochlorothiazide (HYDRODIURIL) 12.5 MG tablet TAKE 1 TABLET BY MOUTH EVERY DAY 90 tablet 3   hydrocortisone (PROCTO-MED HC) 2.5 % rectal cream Place 1 Application rectally 2 (two) times daily. 30 g 0   levocetirizine (XYZAL) 5 MG tablet TAKE ONE TABLET BY MOUTH EACH EVENING 30 tablet 11   Multiple Vitamins-Minerals (WOMENS MULTIVITAMIN PO) Take by mouth.     simvastatin (ZOCOR) 20 MG tablet Take 2 tablets (40 mg total) by mouth at bedtime. 180 tablet 1   triazolam (HALCION) 0.25 MG tablet Prior to dental procedure     venlafaxine XR (EFFEXOR-XR) 150 MG 24 hr capsule Take 1 capsule (150 mg total) by mouth  daily with breakfast. 90 capsule 2   vitamin C (ASCORBIC ACID) 250 MG tablet Take 250 mg by mouth daily.     No facility-administered medications prior to visit.     Per HPI unless specifically indicated in ROS section below Review of Systems  Constitutional:  Negative for chills, fatigue and fever.  HENT:  Negative for congestion.   Eyes:  Negative for pain.  Respiratory:  Negative for cough and shortness of breath.   Cardiovascular:  Negative for chest pain, palpitations and leg swelling.  Gastrointestinal:  Negative for abdominal pain, nausea and vomiting.  Genitourinary:  Positive for dysuria, frequency and  urgency. Negative for flank pain, hematuria and vaginal bleeding.  Musculoskeletal:  Negative for back pain.  Neurological:  Negative for syncope, light-headedness and headaches.  Psychiatric/Behavioral:  Negative for dysphoric mood.    Objective:  BP 120/82 (BP Location: Left Arm, Patient Position: Sitting, Cuff Size: Normal)   Pulse 83   Temp 97.9 F (36.6 C) (Temporal)   Ht 5\' 2"  (1.575 m)   Wt 109 lb (49.4 kg)   LMP 11/06/2012   SpO2 98%   BMI 19.94 kg/m   Wt Readings from Last 3 Encounters:  09/04/22 109 lb (49.4 kg)  07/15/22 112 lb (50.8 kg)  06/10/22 110 lb 12.8 oz (50.3 kg)      Physical Exam Constitutional:      General: She is not in acute distress.    Appearance: Normal appearance. She is well-developed. She is not ill-appearing or toxic-appearing.  HENT:     Head: Normocephalic.     Right Ear: Hearing, tympanic membrane, ear canal and external ear normal. Tympanic membrane is not erythematous, retracted or bulging.     Left Ear: Hearing, tympanic membrane, ear canal and external ear normal. Tympanic membrane is not erythematous, retracted or bulging.     Nose: No mucosal edema or rhinorrhea.     Right Sinus: No maxillary sinus tenderness or frontal sinus tenderness.     Left Sinus: No maxillary sinus tenderness or frontal sinus tenderness.     Mouth/Throat:     Pharynx: Uvula midline.  Eyes:     General: Lids are normal. Lids are everted, no foreign bodies appreciated.     Conjunctiva/sclera: Conjunctivae normal.     Pupils: Pupils are equal, round, and reactive to light.  Neck:     Thyroid: No thyroid mass or thyromegaly.     Vascular: No carotid bruit.     Trachea: Trachea normal.  Cardiovascular:     Rate and Rhythm: Normal rate and regular rhythm.     Pulses: Normal pulses.     Heart sounds: Normal heart sounds, S1 normal and S2 normal. No murmur heard.    No friction rub. No gallop.  Pulmonary:     Effort: Pulmonary effort is normal. No tachypnea or  respiratory distress.     Breath sounds: Normal breath sounds. No decreased breath sounds, wheezing, rhonchi or rales.  Abdominal:     General: Bowel sounds are normal.     Palpations: Abdomen is soft.     Tenderness: There is no abdominal tenderness.  Musculoskeletal:     Cervical back: Normal range of motion and neck supple.  Skin:    General: Skin is warm and dry.     Findings: No rash.  Neurological:     Mental Status: She is alert.  Psychiatric:        Mood and Affect: Mood is not anxious or depressed.  Speech: Speech normal.        Behavior: Behavior normal. Behavior is cooperative.        Thought Content: Thought content normal.        Judgment: Judgment normal.       Results for orders placed or performed in visit on 09/04/22  Urine Culture   Specimen: Urine  Result Value Ref Range   MICRO NUMBER: [id number]    SPECIMEN QUALITY: Adequate    Sample Source URINE    STATUS: FINAL    Result:      Mixed genital flora isolated. These superficial bacteria are not indicative of a urinary tract infection. No further organism identification is warranted on this specimen. If clinically indicated, recollect clean-catch, mid-stream urine and transfer  immediately to Urine Culture Transport Tube.   POCT Urinalysis Dipstick (Automated)  Result Value Ref Range   Color, UA light yellow    Clarity, UA clear    Glucose, UA Negative Negative   Bilirubin, UA neg    Ketones, UA neg    Spec Grav, UA 1.010 1.010 - 1.025   Blood, UA 10 Ery/uL    pH, UA 7.5 5.0 - 8.0   Protein, UA Negative Negative   Urobilinogen, UA 0.2 0.2 or 1.0 E.U./dL   Nitrite, UA neg    Leukocytes, UA Moderate (2+) (A) Negative    Assessment and Plan  Dysuria Assessment & Plan: Acute, likely urinary tract infection given positive urinalysis We will treat with Bactrim double strength 1 tablet twice daily x 3 days.  Send urine for culture.  Push fluids.  Return and ER precautions provided  Orders: -      POCT Urinalysis Dipstick (Automated) -     Urine Culture  Other orders -     Sulfamethoxazole-Trimethoprim; Take 1 tablet by mouth 2 (two) times daily.  Dispense: 6 tablet; Refill: 0    No follow-ups on file.   Kerby Nora, MD

## 2022-09-05 LAB — URINE CULTURE
MICRO NUMBER:: 14990294
SPECIMEN QUALITY:: ADEQUATE

## 2022-09-11 NOTE — Assessment & Plan Note (Signed)
Acute, likely urinary tract infection given positive urinalysis We will treat with Bactrim double strength 1 tablet twice daily x 3 days.  Send urine for culture.  Push fluids.  Return and ER precautions provided

## 2022-10-04 ENCOUNTER — Encounter: Payer: Self-pay | Admitting: Family Medicine

## 2022-10-04 NOTE — Telephone Encounter (Signed)
Spoke with patient and rescheduled

## 2022-10-04 NOTE — Telephone Encounter (Signed)
Please reschedule patient to a 40 minute slot.  Dr. Ermalene Searing prefers TOC in person.

## 2022-10-08 ENCOUNTER — Encounter: Payer: 59 | Admitting: Family Medicine

## 2022-10-10 ENCOUNTER — Encounter: Payer: Self-pay | Admitting: Family Medicine

## 2022-10-10 ENCOUNTER — Ambulatory Visit: Payer: 59 | Admitting: Family Medicine

## 2022-10-10 VITALS — BP 138/76 | HR 82 | Temp 97.5°F | Ht 62.0 in | Wt 108.2 lb

## 2022-10-10 DIAGNOSIS — M8589 Other specified disorders of bone density and structure, multiple sites: Secondary | ICD-10-CM | POA: Diagnosis not present

## 2022-10-10 DIAGNOSIS — M8580 Other specified disorders of bone density and structure, unspecified site: Secondary | ICD-10-CM | POA: Diagnosis not present

## 2022-10-10 DIAGNOSIS — F909 Attention-deficit hyperactivity disorder, unspecified type: Secondary | ICD-10-CM

## 2022-10-10 DIAGNOSIS — J4530 Mild persistent asthma, uncomplicated: Secondary | ICD-10-CM

## 2022-10-10 DIAGNOSIS — Z1211 Encounter for screening for malignant neoplasm of colon: Secondary | ICD-10-CM

## 2022-10-10 DIAGNOSIS — E785 Hyperlipidemia, unspecified: Secondary | ICD-10-CM | POA: Diagnosis not present

## 2022-10-10 DIAGNOSIS — Z862 Personal history of diseases of the blood and blood-forming organs and certain disorders involving the immune mechanism: Secondary | ICD-10-CM | POA: Diagnosis not present

## 2022-10-10 DIAGNOSIS — I10 Essential (primary) hypertension: Secondary | ICD-10-CM

## 2022-10-10 DIAGNOSIS — G4719 Other hypersomnia: Secondary | ICD-10-CM

## 2022-10-10 DIAGNOSIS — Z0000 Encounter for general adult medical examination without abnormal findings: Secondary | ICD-10-CM

## 2022-10-10 DIAGNOSIS — F411 Generalized anxiety disorder: Secondary | ICD-10-CM

## 2022-10-10 DIAGNOSIS — F325 Major depressive disorder, single episode, in full remission: Secondary | ICD-10-CM | POA: Diagnosis not present

## 2022-10-10 MED ORDER — SIMVASTATIN 20 MG PO TABS: 20.0000 mg | ORAL_TABLET | Freq: Every day | ORAL | 1 refills | Status: DC

## 2022-10-10 MED ORDER — SIMVASTATIN 20 MG PO TABS: 20.0000 mg | ORAL_TABLET | Freq: Every day | ORAL | Status: DC

## 2022-10-10 MED ORDER — HYDROCHLOROTHIAZIDE 12.5 MG PO TABS: 12.5000 mg | ORAL_TABLET | Freq: Every day | ORAL | 3 refills | Status: DC

## 2022-10-10 NOTE — Assessment & Plan Note (Signed)
Well controlled. Symbicort 80-4.5 mg and prn albuterol 

## 2022-10-10 NOTE — Assessment & Plan Note (Addendum)
Chronic, well-controlled on venlafaxine 150 mg XR daily.  Helps with menopausal symptoms.   No longer seeing counselor.

## 2022-10-10 NOTE — Patient Instructions (Addendum)
   Referral placed for colonoscopy.  Please call the location of your choice from the menu below to schedule your Mammogram and/or Bone Density appointment.    [facility]   [facility]                      Phone:  [phone] [address]                               [place], [place] [address]                                                             Services: Traditional and 3D Mammogram, Bone Density   [facility]                 Phone: [phone] [address]                                                       [place], [place] [address]    Service: Bone Density ONLY   *this site does NOT perform mammograms  [facility]                        Phone:  [phone] [address]                                  [place], [place] [address]                                            Services:  3D Mammogram and Bone Density

## 2022-10-10 NOTE — Assessment & Plan Note (Signed)
Dx by counselor in past.  No longer using Adderall.. treating behaviorally.

## 2022-10-10 NOTE — Assessment & Plan Note (Signed)
Associated with past head injury.. now resolved entirely.

## 2022-10-10 NOTE — Assessment & Plan Note (Signed)
Chronic, well controlled on venlafaxine.

## 2022-10-10 NOTE — Assessment & Plan Note (Signed)
Stable, chronic.  Continue current medication.  Benazepril 10 mg p.o. daily, HCTZ 12.5 mg p.o. daily

## 2022-10-10 NOTE — Assessment & Plan Note (Addendum)
Due for reevaluation She never increase to 40 mg as was recommended. Simvastatin 20 mg p.o. daily.

## 2022-10-10 NOTE — Assessment & Plan Note (Signed)
Takes vit D supplement, regualr exercise.

## 2022-10-10 NOTE — Progress Notes (Signed)
Patient ID: Tricia Munoz, female    DOB: 09-20-1960, 62 y.o.   MRN: [id number]  This visit was conducted in person.  BP 138/76   Pulse 82   Temp (!) 97.5 F (36.4 C) (Temporal)   Ht 5\' 2"  (1.575 m)   Wt 108 lb 4 oz (49.1 kg)   LMP 11/06/2012   SpO2 96%   BMI 19.80 kg/m    CC:  Chief Complaint  Patient presents with   Transitions Of Care    TOC from Dr. Selena Batten.    Subjective:   HPI: Tricia Munoz is a 62 y.o. female presenting on 10/10/2022 for Transitions Of Care (TOC from Dr. Selena Batten.)  The patient presents for [facility], complete physical and review of chronic health problems. He/She also has the following acute concerns today:   Previous PCP: Selena Batten Last physical 06/2021  Hypertension:  Well-controlled on Benazepril 10 mg p.o. daily, HCTZ 12.5 mg p.o. daily Using medication without problems or lightheadedness:  BP Readings from Last 3 Encounters:  10/10/22 138/76  09/04/22 120/82  07/15/22 134/72   Elevated Cholesterol: Due for reevaluation. Only taking simvastatin 20 mg not 40 mg daily. Lab Results  Component Value Date   CHOL 238 (H) 06/21/2021   HDL 97.60 06/21/2021   LDLCALC 116 (H) 06/21/2021   LDLDIRECT 112.5 04/30/2013   TRIG 122.0 06/21/2021   CHOLHDL 2 06/21/2021  Using medications without problems: Muscle aches: none Diet compliance: heart healthy diet Exercise: Other complaints:  History of anemia.Marland Kitchen with over- donation of blood.  Mild persistent asthma   Symbicort 80-4.5 mg and prn albuterol   She has osteoarthritis as well as positive RF, but no definitive dx of RA.  Major depressive disorder and anxiety , menopausal symptoms in past, she is a sober former alcoholic.  Followed by Merry Lofty, counselor in past. Relevant past medical, surgical, family and social history reviewed and updated as indicated. Interim medical history since our last visit reviewed. Allergies and medications reviewed and updated. Outpatient Medications Prior to Visit   Medication Sig Dispense Refill   albuterol (VENTOLIN HFA) 108 (90 Base) MCG/ACT inhaler Inhale 2 puffs into the lungs every 4 (four) hours as needed for wheezing or shortness of breath. 18 g 2   azelastine (OPTIVAR) 0.05 % ophthalmic solution Place 1 drop into both eyes 2 (two) times daily. 6 mL 11   Azelastine-Fluticasone 137-50 MCG/ACT SUSP 2 spray in each nostril daily 23 g 11   benazepril (LOTENSIN) 10 MG tablet Take 1 tablet (10 mg total) by mouth daily. 90 tablet 1   budesonide-formoterol (SYMBICORT) 80-4.5 MCG/ACT inhaler Inhale 2 puffs into the lungs in the morning and at bedtime. 1 each 0   Cholecalciferol (VITAMIN D) 2000 units CAPS Take by mouth.     hydrocortisone (PROCTO-MED HC) 2.5 % rectal cream Place 1 Application rectally 2 (two) times daily. (Patient taking differently: Place 1 Application rectally 2 (two) times daily. As needed) 30 g 0   levocetirizine (XYZAL) 5 MG tablet TAKE ONE TABLET BY MOUTH EACH EVENING 30 tablet 11   Multiple Vitamins-Minerals (WOMENS MULTIVITAMIN PO) Take by mouth.     triazolam (HALCION) 0.25 MG tablet Prior to dental procedure     venlafaxine XR (EFFEXOR-XR) 150 MG 24 hr capsule Take 1 capsule (150 mg total) by mouth daily with breakfast. 90 capsule 2   vitamin C (ASCORBIC ACID) 250 MG tablet Take 250 mg by mouth daily.     simvastatin (ZOCOR) 20 MG  tablet Take 2 tablets (40 mg total) by mouth at bedtime. 180 tablet 1   simvastatin (ZOCOR) 20 MG tablet Take 20 mg by mouth daily.     fluconazole (DIFLUCAN) 150 MG tablet Take 1 tab po today, repeat in 7 days if needed 2 tablet 0   hydrochlorothiazide (HYDRODIURIL) 12.5 MG tablet TAKE 1 TABLET BY MOUTH EVERY DAY 90 tablet 3   sulfamethoxazole-trimethoprim (BACTRIM DS) 800-160 MG tablet Take 1 tablet by mouth 2 (two) times daily. 6 tablet 0   No facility-administered medications prior to visit.     Per HPI unless specifically indicated in ROS section below Review of Systems  Constitutional:   Negative for fatigue and fever.  HENT:  Negative for congestion.   Eyes:  Negative for pain.  Respiratory:  Negative for cough and shortness of breath.   Cardiovascular:  Negative for chest pain, palpitations and leg swelling.  Gastrointestinal:  Negative for abdominal pain.  Genitourinary:  Negative for dysuria and vaginal bleeding.  Musculoskeletal:  Negative for back pain.  Neurological:  Negative for syncope, light-headedness and headaches.  Psychiatric/Behavioral:  Negative for dysphoric mood.    Objective:  BP 138/76   Pulse 82   Temp (!) 97.5 F (36.4 C) (Temporal)   Ht 5\' 2"  (1.575 m)   Wt 108 lb 4 oz (49.1 kg)   LMP 11/06/2012   SpO2 96%   BMI 19.80 kg/m   Wt Readings from Last 3 Encounters:  10/10/22 108 lb 4 oz (49.1 kg)  09/04/22 109 lb (49.4 kg)  07/15/22 112 lb (50.8 kg)      Physical Exam Vitals and nursing note reviewed.  Constitutional:      General: She is not in acute distress.    Appearance: Normal appearance. She is well-developed. She is not ill-appearing or toxic-appearing.  HENT:     Head: Normocephalic.     Right Ear: Hearing, tympanic membrane, ear canal and external ear normal.     Left Ear: Hearing, tympanic membrane, ear canal and external ear normal.     Nose: Nose normal.  Eyes:     General: Lids are normal. Lids are everted, no foreign bodies appreciated.     Conjunctiva/sclera: Conjunctivae normal.     Pupils: Pupils are equal, round, and reactive to light.  Neck:     Thyroid: No thyroid mass or thyromegaly.     Vascular: No carotid bruit.     Trachea: Trachea normal.  Cardiovascular:     Rate and Rhythm: Normal rate and regular rhythm.     Heart sounds: Normal heart sounds, S1 normal and S2 normal. No murmur heard.    No gallop.  Pulmonary:     Effort: Pulmonary effort is normal. No respiratory distress.     Breath sounds: Normal breath sounds. No wheezing, rhonchi or rales.  Abdominal:     General: Bowel sounds are normal. There  is no distension or abdominal bruit.     Palpations: Abdomen is soft. There is no fluid wave or mass.     Tenderness: There is no abdominal tenderness. There is no guarding or rebound.     Hernia: No hernia is present.  Musculoskeletal:     Cervical back: Normal range of motion and neck supple.  Lymphadenopathy:     Cervical: No cervical adenopathy.  Skin:    General: Skin is warm and dry.     Findings: No rash.  Neurological:     Mental Status: She is alert.  Cranial Nerves: No cranial nerve deficit.     Sensory: No sensory deficit.  Psychiatric:        Mood and Affect: Mood is not anxious or depressed.        Speech: Speech normal.        Behavior: Behavior normal. Behavior is cooperative.        Judgment: Judgment normal.       Results for orders placed or performed in visit on 09/04/22  Urine Culture   Specimen: Urine  Result Value Ref Range   MICRO NUMBER: [id number]    SPECIMEN QUALITY: Adequate    Sample Source URINE    STATUS: FINAL    Result:      Mixed genital flora isolated. These superficial bacteria are not indicative of a urinary tract infection. No further organism identification is warranted on this specimen. If clinically indicated, recollect clean-catch, mid-stream urine and transfer  immediately to Urine Culture Transport Tube.   POCT Urinalysis Dipstick (Automated)  Result Value Ref Range   Color, UA light yellow    Clarity, UA clear    Glucose, UA Negative Negative   Bilirubin, UA neg    Ketones, UA neg    Spec Grav, UA 1.010 1.010 - 1.025   Blood, UA 10 Ery/uL    pH, UA 7.5 5.0 - 8.0   Protein, UA Negative Negative   Urobilinogen, UA 0.2 0.2 or 1.0 E.U./dL   Nitrite, UA neg    Leukocytes, UA Moderate (2+) (A) Negative    Assessment and Plan The patient's preventative maintenance and recommended screening tests for an annual wellness exam were reviewed in full today. Brought up to date unless services declined.  Counselled on the importance  of diet, exercise, and its role in overall health and mortality. The patient's FH and SH was reviewed, including their home life, tobacco status, and drug and alcohol status.   Vaccines: Uptodate with flu, Tdap Pap/DVE:  2023 Mammo:  DUE Bone Density: DUE Colon:  DUE Father with colon cancer. Neg cologuard in 2020.. referred to colonoscopy. Smoking Status: none ETOH/ drug UKG:URKY/HCWC  Hep C:   HIV screen:    Routine general medical examination at a health care facility  Essential hypertension -     hydroCHLOROthiazide; Take 1 tablet (12.5 mg total) by mouth daily.  Dispense: 90 tablet; Refill: 3  Primary hypertension Assessment & Plan: Stable, chronic.  Continue current medication.  Benazepril 10 mg p.o. daily, HCTZ 12.5 mg p.o. daily   Hyperlipidemia, unspecified hyperlipidemia type Assessment & Plan: Due for reevaluation She never increase to 40 mg as was recommended. Simvastatin 20 mg p.o. daily.  Orders: -     Lipid panel; Future -     Comprehensive metabolic panel; Future  Mild persistent asthma without complication Assessment & Plan: Well controlled.   Symbicort 80-4.5 mg and prn albuterol   History of anemia -     CBC with Differential/Platelet; Future  MDD (major depressive disorder), single episode, in full remission (HCC) Assessment & Plan: Chronic, well-controlled on venlafaxine 150 mg XR daily.  Helps with menopausal symptoms.   No longer seeing counselor.    Osteopenia of multiple sites Assessment & Plan:  Takes vit D supplement, regualr exercise.   GAD (generalized anxiety disorder) Assessment & Plan:  Chronic, well controlled on venlafaxine.   Adult ADHD (attention deficit hyperactivity disorder) Assessment & Plan: Dx by counselor in past.  No longer using Adderall.. treating behaviorally.   Transient hypersomnia Assessment & Plan:  Associated with past head injury.. now resolved entirely.   Colon cancer screening -      Ambulatory referral to Gastroenterology  Osteopenia, unspecified location Assessment & Plan:  Takes vit D supplement, regualr exercise.  Orders: -     DG Bone Density; Future  Other orders -     Simvastatin; Take 1 tablet (20 mg total) by mouth daily.  Dispense: 30 tablet    Return for  fasting lab only.   Kerby Nora, MD

## 2022-10-11 ENCOUNTER — Other Ambulatory Visit: Payer: Self-pay

## 2022-10-11 ENCOUNTER — Telehealth: Payer: Self-pay

## 2022-10-11 DIAGNOSIS — Z1211 Encounter for screening for malignant neoplasm of colon: Secondary | ICD-10-CM

## 2022-10-11 MED ORDER — SUTAB 1479-225-188 MG PO TABS: 12.0000 | ORAL_TABLET | Freq: Two times a day (BID) | ORAL | 0 refills | Status: AC

## 2022-10-11 NOTE — Telephone Encounter (Signed)
Gastroenterology Pre-Procedure Review  Request Date: 11/27/22 Requesting Physician: Dr. Allegra Lai  PATIENT REVIEW QUESTIONS: The patient responded to the following health history questions as indicated:    1. Are you having any GI issues? no 2. Do you have a personal history of Polyps? no 3. Do you have a family history of Colon Cancer or Polyps? no 4. Diabetes Mellitus? no 5. Joint replacements in the past 12 months?no 6. Major health problems in the past 3 months?no 7. Any artificial heart valves, MVP, or defibrillator?no    MEDICATIONS & ALLERGIES:    Patient reports the following regarding taking any anticoagulation/antiplatelet therapy:   Plavix, Coumadin, Eliquis, Xarelto, Lovenox, Pradaxa, Brilinta, or Effient? no Aspirin? no  Patient confirms/reports the following medications:  Current Outpatient Medications  Medication Sig Dispense Refill   albuterol (VENTOLIN HFA) 108 (90 Base) MCG/ACT inhaler Inhale 2 puffs into the lungs every 4 (four) hours as needed for wheezing or shortness of breath. 18 g 2   azelastine (OPTIVAR) 0.05 % ophthalmic solution Place 1 drop into both eyes 2 (two) times daily. 6 mL 11   Azelastine-Fluticasone 137-50 MCG/ACT SUSP 2 spray in each nostril daily 23 g 11   benazepril (LOTENSIN) 10 MG tablet Take 1 tablet (10 mg total) by mouth daily. 90 tablet 1   budesonide-formoterol (SYMBICORT) 80-4.5 MCG/ACT inhaler Inhale 2 puffs into the lungs in the morning and at bedtime. 1 each 0   Cholecalciferol (VITAMIN D) 2000 units CAPS Take by mouth.     hydrochlorothiazide (HYDRODIURIL) 12.5 MG tablet Take 1 tablet (12.5 mg total) by mouth daily. 90 tablet 3   hydrocortisone (PROCTO-MED HC) 2.5 % rectal cream Place 1 Application rectally 2 (two) times daily. (Patient taking differently: Place 1 Application rectally 2 (two) times daily. As needed) 30 g 0   levocetirizine (XYZAL) 5 MG tablet TAKE ONE TABLET BY MOUTH EACH EVENING 30 tablet 11   Multiple Vitamins-Minerals  (WOMENS MULTIVITAMIN PO) Take by mouth.     simvastatin (ZOCOR) 20 MG tablet Take 1 tablet (20 mg total) by mouth daily. 30 tablet    triazolam (HALCION) 0.25 MG tablet Prior to dental procedure     venlafaxine XR (EFFEXOR-XR) 150 MG 24 hr capsule Take 1 capsule (150 mg total) by mouth daily with breakfast. 90 capsule 2   vitamin C (ASCORBIC ACID) 250 MG tablet Take 250 mg by mouth daily.     No current facility-administered medications for this visit.    Patient confirms/reports the following allergies:  Allergies  Allergen Reactions   Evekeo [Amphetamine Sulfate]    Latex Hives   Montelukast     itch   Singulair [Montelukast Sodium] Rash    No orders of the defined types were placed in this encounter.   AUTHORIZATION INFORMATION Primary Insurance: 1D#: Group #:  Secondary Insurance: 1D#: Group #:  SCHEDULE INFORMATION: Date: 11/27/22 Time: Location: [facility]

## 2022-10-16 ENCOUNTER — Other Ambulatory Visit (INDEPENDENT_AMBULATORY_CARE_PROVIDER_SITE_OTHER): Payer: 59

## 2022-10-16 DIAGNOSIS — E785 Hyperlipidemia, unspecified: Secondary | ICD-10-CM

## 2022-10-16 DIAGNOSIS — Z862 Personal history of diseases of the blood and blood-forming organs and certain disorders involving the immune mechanism: Secondary | ICD-10-CM

## 2022-10-16 LAB — CBC WITH DIFFERENTIAL/PLATELET
Basophils Absolute: 0.1 10*3/uL (ref 0.0–0.1)
Basophils Relative: 2.1 % (ref 0.0–3.0)
Eosinophils Absolute: 0.1 10*3/uL (ref 0.0–0.7)
Eosinophils Relative: 2.3 % (ref 0.0–5.0)
HCT: 37.9 % (ref 36.0–46.0)
Hemoglobin: 12.6 g/dL (ref 12.0–15.0)
Lymphocytes Relative: 41.1 % (ref 12.0–46.0)
Lymphs Abs: 2.1 10*3/uL (ref 0.7–4.0)
MCHC: 33.3 g/dL (ref 30.0–36.0)
MCV: 97.3 fl (ref 78.0–100.0)
Monocytes Absolute: 0.5 10*3/uL (ref 0.1–1.0)
Monocytes Relative: 10.2 % (ref 3.0–12.0)
Neutro Abs: 2.2 10*3/uL (ref 1.4–7.7)
Neutrophils Relative %: 44.3 % (ref 43.0–77.0)
Platelets: 337 10*3/uL (ref 150.0–400.0)
RBC: 3.89 Mil/uL (ref 3.87–5.11)
RDW: 13.6 % (ref 11.5–15.5)
WBC: 5 10*3/uL (ref 4.0–10.5)

## 2022-10-16 LAB — COMPREHENSIVE METABOLIC PANEL
ALT: 12 U/L (ref 0–35)
AST: 17 U/L (ref 0–37)
Albumin: 4.4 g/dL (ref 3.5–5.2)
Alkaline Phosphatase: 90 U/L (ref 39–117)
BUN: 14 mg/dL (ref 6–23)
CO2: 31 mEq/L (ref 19–32)
Calcium: 10 mg/dL (ref 8.4–10.5)
Chloride: 104 mEq/L (ref 96–112)
Creatinine, Ser: 0.8 mg/dL (ref 0.40–1.20)
GFR: 78.13 mL/min (ref >60.00)
Glucose, Bld: 100 mg/dL — ABNORMAL HIGH (ref 70–99)
Potassium: 4.4 mEq/L (ref 3.5–5.1)
Sodium: 140 mEq/L (ref 135–145)
Total Bilirubin: 0.4 mg/dL (ref 0.2–1.2)
Total Protein: 7.3 g/dL (ref 6.0–8.3)

## 2022-10-16 LAB — LIPID PANEL
Cholesterol: 237 mg/dL — ABNORMAL HIGH (ref 0–200)
HDL: 85.2 mg/dL (ref >39.00)
LDL Cholesterol: 141 mg/dL — ABNORMAL HIGH (ref 0–99)
NonHDL: 151.75
Total CHOL/HDL Ratio: 3
Triglycerides: 55 mg/dL (ref 0.0–149.0)
VLDL: 11 mg/dL (ref 0.0–40.0)

## 2022-11-07 ENCOUNTER — Other Ambulatory Visit: Payer: Self-pay | Admitting: Family Medicine

## 2022-11-07 DIAGNOSIS — Z1231 Encounter for screening mammogram for malignant neoplasm of breast: Secondary | ICD-10-CM

## 2022-11-08 ENCOUNTER — Ambulatory Visit
Admission: RE | Admit: 2022-11-08 | Discharge: 2022-11-08 | Disposition: A | Discharge status: Home / Self Care | Payer: 59 | Source: Ambulatory Visit | Attending: Family Medicine | Admitting: Family Medicine

## 2022-11-08 DIAGNOSIS — Z1231 Encounter for screening mammogram for malignant neoplasm of breast: Secondary | ICD-10-CM | POA: Diagnosis not present

## 2022-11-20 ENCOUNTER — Ambulatory Visit: Payer: 59 | Admitting: Family Medicine

## 2022-11-27 ENCOUNTER — Ambulatory Visit: Payer: 59 | Admitting: Certified Registered Nurse Anesthetist

## 2022-11-27 ENCOUNTER — Ambulatory Visit
Admission: RE | Admit: 2022-11-27 | Discharge: 2022-11-27 | Disposition: A | Discharge status: Home / Self Care | Payer: 59 | Attending: Gastroenterology | Admitting: Gastroenterology

## 2022-11-27 ENCOUNTER — Encounter
Admission: RE | Disposition: A | Discharge status: Home / Self Care | Payer: Self-pay | Source: Home / Self Care | Attending: Gastroenterology

## 2022-11-27 ENCOUNTER — Encounter: Payer: Self-pay | Admitting: Gastroenterology

## 2022-11-27 DIAGNOSIS — Z1211 Encounter for screening for malignant neoplasm of colon: Secondary | ICD-10-CM | POA: Diagnosis not present

## 2022-11-27 DIAGNOSIS — K635 Polyp of colon: Secondary | ICD-10-CM | POA: Diagnosis not present

## 2022-11-27 DIAGNOSIS — D123 Benign neoplasm of transverse colon: Secondary | ICD-10-CM | POA: Diagnosis not present

## 2022-11-27 DIAGNOSIS — Z800 Family history of malignant neoplasm of digestive organs: Secondary | ICD-10-CM | POA: Insufficient documentation

## 2022-11-27 DIAGNOSIS — D122 Benign neoplasm of ascending colon: Secondary | ICD-10-CM | POA: Insufficient documentation

## 2022-11-27 HISTORY — PX: COLONOSCOPY WITH PROPOFOL: SHX5780

## 2022-11-27 HISTORY — PX: BIOPSY: SHX5522

## 2022-11-27 SURGERY — COLONOSCOPY WITH PROPOFOL
Anesthesia: General

## 2022-11-27 MED ORDER — PROPOFOL 10 MG/ML IV BOLUS
INTRAVENOUS | Status: DC | PRN
Start: 2022-11-27 — End: 2022-11-27
  Administered 2022-11-27: 80 mg via INTRAVENOUS
  Administered 2022-11-27: 20 mg via INTRAVENOUS

## 2022-11-27 MED ORDER — LIDOCAINE HCL (CARDIAC) PF 100 MG/5ML IV SOSY
PREFILLED_SYRINGE | INTRAVENOUS | Status: DC | PRN
  Administered 2022-11-27: 50 mg via INTRAVENOUS

## 2022-11-27 MED ORDER — PROPOFOL 500 MG/50ML IV EMUL
INTRAVENOUS | Status: DC | PRN
  Administered 2022-11-27: 150 ug/kg/min via INTRAVENOUS

## 2022-11-27 MED ORDER — SODIUM CHLORIDE 0.9 % IV SOLN: INTRAVENOUS | Status: DC

## 2022-11-27 NOTE — Transfer of Care (Signed)
Immediate Anesthesia Transfer of Care Note  Patient: Tricia Munoz  Procedure(s) Performed: COLONOSCOPY WITH PROPOFOL BIOPSY  Patient Location: Endoscopy Unit  Anesthesia Type:General  Level of Consciousness: awake and drowsy  Airway & Oxygen Therapy: Patient Spontanous Breathing  Post-op Assessment: Report given to RN and Post -op Vital signs reviewed and stable  Post vital signs: Reviewed and stable  Last Vitals:  Vitals Value Taken Time  BP 133/69 11/27/22 0936  Temp 36.1 C 11/27/22 0936  Pulse 66 11/27/22 0936  Resp 17 11/27/22 0936  SpO2 99 % 11/27/22 0936  Vitals shown include unfiled device data.  Last Pain:  Vitals:   11/27/22 0936  TempSrc: Tympanic  PainSc: Asleep         Complications: No notable events documented.

## 2022-11-27 NOTE — H&P (Signed)
Tricia Repress, MD [address]  [address]  [place], [place] [address]  Main: [phone]  Fax: [phone] Pager: [phone]  Primary Care Physician:  Excell Seltzer, MD Primary Gastroenterologist:  Dr. Arlyss Munoz  Pre-Procedure History & Physical: HPI:  Tricia Munoz is a 62 y.o. female is here for an colonoscopy.   Past Medical History:  Diagnosis Date   ADHD    Asthma    Head injury 08/04/2017   Hypertension    Memory difficulties 10/08/2017   OA (osteoarthritis)    Osteopenia     Past Surgical History:  Procedure Laterality Date   FOOT SURGERY      Prior to Admission medications   Medication Sig Start Date End Date Taking? Authorizing Provider  azelastine (OPTIVAR) 0.05 % ophthalmic solution Place 1 drop into both eyes 2 (two) times daily. 12/06/21  Yes Copland, Karleen Hampshire, MD  Azelastine-Fluticasone 137-50 MCG/ACT SUSP 2 spray in each nostril daily 12/06/21  Yes Copland, Karleen Hampshire, MD  benazepril (LOTENSIN) 10 MG tablet Take 1 tablet (10 mg total) by mouth daily. 06/07/22  Yes Bedsole, Amy E, MD  budesonide-formoterol (SYMBICORT) 80-4.5 MCG/ACT inhaler Inhale 2 puffs into the lungs in the morning and at bedtime. 07/20/20  Yes Gweneth Dimitri, MD  Cholecalciferol (VITAMIN D) 2000 units CAPS Take by mouth.   Yes [provider]  hydrochlorothiazide (HYDRODIURIL) 12.5 MG tablet Take 1 tablet (12.5 mg total) by mouth daily. 10/10/22  Yes Bedsole, Amy E, MD  Multiple Vitamins-Minerals (WOMENS MULTIVITAMIN PO) Take by mouth.   Yes [provider]  simvastatin (ZOCOR) 20 MG tablet Take 1 tablet (20 mg total) by mouth daily. 10/10/22  Yes Bedsole, Amy E, MD  venlafaxine XR (EFFEXOR-XR) 150 MG 24 hr capsule Take 1 capsule (150 mg total) by mouth daily with breakfast. 04/09/22  Yes Worthy Rancher B, FNP  vitamin C (ASCORBIC ACID) 250 MG tablet Take 250 mg by mouth daily.   Yes [provider]  albuterol (VENTOLIN HFA) 108 (90 Base) MCG/ACT inhaler  Inhale 2 puffs into the lungs every 4 (four) hours as needed for wheezing or shortness of breath. 02/05/20   Koberlein, Paris Lore, MD  hydrocortisone (PROCTO-MED HC) 2.5 % rectal cream Place 1 Application rectally 2 (two) times daily. Patient taking differently: Place 1 Application rectally 2 (two) times daily. As needed 06/10/22   Mort Sawyers, FNP  levocetirizine (XYZAL) 5 MG tablet TAKE ONE TABLET BY MOUTH EACH EVENING 04/29/19   Dianne Dun, MD  triazolam (HALCION) 0.25 MG tablet Prior to dental procedure 09/12/20   [provider]    Allergies as of 10/11/2022 - Review Complete 10/11/2022  Allergen Reaction Noted   Evekeo [amphetamine sulfate]  01/15/2017   Latex Hives 04/25/2021   Montelukast  07/30/2017   Singulair [montelukast sodium] Rash 01/15/2017    Family History  Problem Relation Age of Onset   Dementia Mother    Rheum arthritis Mother    Hypertension Father    Allergic rhinitis Father    Colon cancer Father    Lung cancer Father    Kidney failure Sister        s/p transplant   Breast cancer Maternal Aunt 37       1/2 sister   Angioedema Neg Hx    Asthma Neg Hx    Eczema Neg Hx    Immunodeficiency Neg Hx    Urticaria Neg Hx     Social History   Socioeconomic History  Marital status: Married    Spouse name: Louis   Number of children: 1   Years of education: high school   Highest education level: Not on file  Occupational History   Occupation: self employeed  Tobacco Use   Smoking status: Never   Smokeless tobacco: Never  Vaping Use   Vaping status: Never Used  Substance and Sexual Activity   Alcohol use: No   Drug use: No   Sexual activity: Yes    Birth control/protection: Post-menopausal  Other Topics Concern   Not on file  Social History Narrative   Lives in [place] with husband.    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    Used to sell bread and jams at the [employer]      Family: [name] dog      Enjoys: cooking, Archivist, crafts, reading, hiking      Exercise: walking - active throughout the day   Diet: generally healthy, not a big meat eater      Safety   Seat belts: Yes    Guns: Yes  and secure   Safe in relationships: Yes    Social Determinants of Corporate investment banker Strain: Not on file  Food Insecurity: Not on file  Transportation Needs: Not on file  Physical Activity: Not on file  Stress: Not on file  Social Connections: Not on file  Intimate Partner Violence: Not on file    Review of Systems: See HPI, otherwise negative ROS  Physical Exam: BP (!) 134/91   Pulse (!) 59   Temp (!) 96.9 F (36.1 C) (Temporal)   Resp 16   Ht 5\' 2"  (1.575 m)   Wt 47.8 kg   LMP 11/06/2012   SpO2 99%   BMI 19.28 kg/m  General:   Alert,  pleasant and cooperative in NAD Head:  Normocephalic and atraumatic. Neck:  Supple; no masses or thyromegaly. Lungs:  Clear throughout to auscultation.    Heart:  Regular rate and rhythm. Abdomen:  Soft, nontender and nondistended. Normal bowel sounds, without guarding, and without rebound.   Neurologic:  Alert and  oriented x4;  grossly normal neurologically.  Impression/Plan: Tricia Munoz is here for an colonoscopy to be performed for colon cancer screening  Risks, benefits, limitations, and alternatives regarding  colonoscopy have been reviewed with the patient.  Questions have been answered.  All parties agreeable.   Lannette Donath, MD  11/27/2022, 8:55 AM

## 2022-11-27 NOTE — Op Note (Signed)
[facility] Patient Name: Tricia Munoz Procedure Date: 11/27/2022 8:53 AM MRN: [id number] Account #: [id number] Date of Birth: [birth year] Admit Type: Outpatient Age: 62 Room: [facility] ENDO ROOM 4 Gender: Female Note Status: Finalized Instrument Name: Peds Colonoscope [id number] Procedure:             Colonoscopy Indications:           Screening for colorectal malignant neoplasm Providers:             Toney Reil MD, MD Referring MD:          Excell Seltzer MD, MD (Referring MD) Medicines:             General Anesthesia Complications:         No immediate complications. Estimated blood loss: None. Procedure:             Pre-Anesthesia Assessment:                        - Prior to the procedure, a History and Physical was                         performed, and patient medications and allergies were                         reviewed. The patient is competent. The risks and                         benefits of the procedure and the sedation options and                         risks were discussed with the patient. All questions                         were answered and informed consent was obtained.                         Patient identification and proposed procedure were                         verified by the physician, the nurse, the                         anesthesiologist, the anesthetist and the technician                         in the pre-procedure area in the procedure room in the                         endoscopy suite. Mental Status Examination: alert and                         oriented. Airway Examination: normal oropharyngeal                         airway and neck mobility. Respiratory Examination:                         clear to auscultation. CV Examination: normal.  Prophylactic Antibiotics: The patient does not require                         prophylactic antibiotics. Prior Anticoagulants: The                          patient has taken no anticoagulant or antiplatelet                         agents. ASA Grade Assessment: II - A patient with mild                         systemic disease. After reviewing the risks and                         benefits, the patient was deemed in satisfactory                         condition to undergo the procedure. The anesthesia                         plan was to use general anesthesia. Immediately prior                         to administration of medications, the patient was                         re-assessed for adequacy to receive sedatives. The                         heart rate, respiratory rate, oxygen saturations,                         blood pressure, adequacy of pulmonary ventilation, and                         response to care were monitored throughout the                         procedure. The physical status of the patient was                         re-assessed after the procedure.                        After obtaining informed consent, the colonoscope was                         passed under direct vision. Throughout the procedure,                         the patient's blood pressure, pulse, and oxygen                         saturations were monitored continuously. The                         Colonoscope was introduced through the anus and  advanced to the the cecum, identified by appendiceal                         orifice and ileocecal valve. The colonoscopy was                         performed without difficulty. The patient tolerated                         the procedure well. The quality of the bowel                         preparation was evaluated using the BBPS [facility] Bowel                         Preparation Scale) with scores of: Right Colon = 3,                         Transverse Colon = 3 and Left Colon = 3 (entire mucosa                         seen well with no residual staining, small fragments                          of stool or opaque liquid). The total BBPS score                         equals 9. The ileocecal valve, appendiceal orifice,                         and rectum were photographed. Findings:      The perianal and digital rectal examinations were normal. Pertinent       negatives include normal sphincter tone and no palpable rectal lesions.      Two sessile polyps were found in the ascending colon. The polyps were 7       to 8 mm in size. These polyps were removed with a cold snare. Resection       and retrieval were complete.      A 6 mm polyp was found in the transverse colon. The polyp was sessile.       The polyp was removed with a cold snare. Resection and retrieval were       complete.      The retroflexed view of the distal rectum and anal verge was normal and       showed no anal or rectal abnormalities. Impression:            - Two 7 to 8 mm polyps in the ascending colon, removed                         with a cold snare. Resected and retrieved.                        - One 6 mm polyp in the transverse colon, removed with                         a cold snare. Resected and retrieved.                        -  The distal rectum and anal verge are normal on                         retroflexion view. Recommendation:        - Discharge patient to home (with escort).                        - Resume previous diet today.                        - Continue present medications.                        - Await pathology results.                        - Repeat colonoscopy in 3 years for surveillance of                         multiple polyps. Procedure Code(s):     --- Professional ---                        [phone], Colonoscopy, flexible; with removal of                         tumor(s), polyp(s), or other lesion(s) by snare                         technique Diagnosis Code(s):     --- Professional ---                        Z12.11, Encounter for screening for malignant neoplasm                          of colon                        D12.3, Benign neoplasm of transverse colon (hepatic                         flexure or splenic flexure)                        D12.2, Benign neoplasm of ascending colon CPT copyright 2022 American Medical Association. All rights reserved. The codes documented in this report are preliminary and upon coder review may  be revised to meet current compliance requirements. Dr. Libby Maw Toney Reil MD, MD 11/27/2022 9:33:37 AM This report has been signed electronically. Number of Addenda: 0 Note Initiated On: 11/27/2022 8:53 AM Scope Withdrawal Time: 0 hours 15 minutes 1 second  Total Procedure Duration: 0 hours 18 minutes 15 seconds  Estimated Blood Loss:  Estimated blood loss: none.      [facility]

## 2022-11-27 NOTE — Anesthesia Procedure Notes (Signed)
Date/Time: 11/27/2022 9:07 AM  Performed by: Ginger Carne, CRNAPre-anesthesia Checklist: Patient identified, Emergency Drugs available, Suction available, Patient being monitored and Timeout performed Patient Re-evaluated:Patient Re-evaluated prior to induction Oxygen Delivery Method: Nasal cannula Preoxygenation: Pre-oxygenation with 100% oxygen Induction Type: IV induction

## 2022-11-27 NOTE — Anesthesia Preprocedure Evaluation (Signed)
Anesthesia Evaluation  Patient identified by MRN, date of birth, ID band Patient awake    Reviewed: Allergy & Precautions, H&P , NPO status , Patient's Chart, lab work & pertinent test results, reviewed documented beta blocker date and time   Airway Mallampati: II   Neck ROM: full    Dental  (+) Poor Dentition   Pulmonary asthma    Pulmonary exam normal        Cardiovascular Exercise Tolerance: Good hypertension, On Medications negative cardio ROS Normal cardiovascular exam Rhythm:regular Rate:Normal     Neuro/Psych   Anxiety Depression    negative neurological ROS  negative psych ROS   GI/Hepatic negative GI ROS, Neg liver ROS,,,  Endo/Other  negative endocrine ROS    Renal/GU negative Renal ROS  negative genitourinary   Musculoskeletal   Abdominal   Peds  Hematology negative hematology ROS (+)   Anesthesia Other Findings Past Medical History: No date: ADHD No date: Asthma 08/04/2017: Head injury No date: Hypertension 10/08/2017: Memory difficulties No date: OA (osteoarthritis) No date: Osteopenia Past Surgical History: No date: FOOT SURGERY BMI    Body Mass Index: 19.28 kg/m     Reproductive/Obstetrics negative OB ROS                             Anesthesia Physical Anesthesia Plan  ASA: 2  Anesthesia Plan: General   Post-op Pain Management:    Induction:   PONV Risk Score and Plan:   Airway Management Planned:   Additional Equipment:   Intra-op Plan:   Post-operative Plan:   Informed Consent: I have reviewed the patients History and Physical, chart, labs and discussed the procedure including the risks, benefits and alternatives for the proposed anesthesia with the patient or authorized representative who has indicated his/her understanding and acceptance.     Dental Advisory Given  Plan Discussed with: CRNA  Anesthesia Plan Comments:         Anesthesia Quick Evaluation

## 2022-11-28 ENCOUNTER — Encounter: Payer: Self-pay | Admitting: Gastroenterology

## 2022-12-02 ENCOUNTER — Encounter: Payer: Self-pay | Admitting: Gastroenterology

## 2022-12-02 NOTE — Anesthesia Postprocedure Evaluation (Signed)
Anesthesia Post Note  Patient: Tricia Munoz  Procedure(s) Performed: COLONOSCOPY WITH PROPOFOL BIOPSY  Patient location during evaluation: PACU Anesthesia Type: General Level of consciousness: awake and alert Pain management: pain level controlled Vital Signs Assessment: post-procedure vital signs reviewed and stable Respiratory status: spontaneous breathing, nonlabored ventilation, respiratory function stable and patient connected to nasal cannula oxygen Cardiovascular status: blood pressure returned to baseline and stable Postop Assessment: no apparent nausea or vomiting Anesthetic complications: no   No notable events documented.   Last Vitals:  Vitals:   11/27/22 0946 11/27/22 0956  BP: 134/71 132/80  Pulse: (!) 59 (!) 59  Resp: 17 17  Temp:    SpO2: 100% 100%    Last Pain:  Vitals:   11/27/22 0956  TempSrc:   PainSc: 0-No pain                 Yevette Edwards

## 2022-12-08 ENCOUNTER — Other Ambulatory Visit: Payer: Self-pay | Admitting: Family Medicine

## 2022-12-08 DIAGNOSIS — I10 Essential (primary) hypertension: Secondary | ICD-10-CM

## 2023-01-23 ENCOUNTER — Encounter: Payer: Self-pay | Admitting: Family Medicine

## 2023-01-23 DIAGNOSIS — F32A Depression, unspecified: Secondary | ICD-10-CM

## 2023-01-24 MED ORDER — VENLAFAXINE HCL ER 150 MG PO CP24
150.0000 mg | ORAL_CAPSULE | Freq: Every day | ORAL | 2 refills | Status: DC
Start: 2023-01-24 — End: 2023-04-03

## 2023-03-20 ENCOUNTER — Telehealth: Payer: Self-pay | Admitting: Family Medicine

## 2023-03-20 ENCOUNTER — Other Ambulatory Visit: Payer: Self-pay | Admitting: Family Medicine

## 2023-03-20 DIAGNOSIS — E785 Hyperlipidemia, unspecified: Secondary | ICD-10-CM

## 2023-03-20 NOTE — Telephone Encounter (Signed)
Orders placed in EPIC.  Please call and schedule patient for fasting lab appointment.

## 2023-03-20 NOTE — Telephone Encounter (Signed)
Pt called in requesting to schedule labs to recheck cholesterol. Pt states Dr. Ermalene Searing wanted her to have labs done. Can orders be submitted for labs? Call back # [phone]

## 2023-03-21 NOTE — Telephone Encounter (Signed)
Vm box was full

## 2023-03-24 ENCOUNTER — Other Ambulatory Visit (INDEPENDENT_AMBULATORY_CARE_PROVIDER_SITE_OTHER): Payer: 59

## 2023-03-24 DIAGNOSIS — E785 Hyperlipidemia, unspecified: Secondary | ICD-10-CM

## 2023-03-24 LAB — COMPREHENSIVE METABOLIC PANEL
ALT: 10 U/L (ref 0–35)
AST: 16 U/L (ref 0–37)
Albumin: 4 g/dL (ref 3.5–5.2)
Alkaline Phosphatase: 95 U/L (ref 39–117)
BUN: 11 mg/dL (ref 6–23)
CO2: 31 meq/L (ref 19–32)
Calcium: 9.1 mg/dL (ref 8.4–10.5)
Chloride: 101 meq/L (ref 96–112)
Creatinine, Ser: 0.7 mg/dL (ref 0.40–1.20)
GFR: 91.43 mL/min (ref >60.00)
Glucose, Bld: 95 mg/dL (ref 70–99)
Potassium: 3.9 meq/L (ref 3.5–5.1)
Sodium: 137 meq/L (ref 135–145)
Total Bilirubin: 0.4 mg/dL (ref 0.2–1.2)
Total Protein: 7.1 g/dL (ref 6.0–8.3)

## 2023-03-24 LAB — LIPID PANEL
Cholesterol: 204 mg/dL — ABNORMAL HIGH (ref 0–200)
HDL: 75.8 mg/dL (ref >39.00)
LDL Cholesterol: 114 mg/dL — ABNORMAL HIGH (ref 0–99)
NonHDL: 128.05
Total CHOL/HDL Ratio: 3
Triglycerides: 70 mg/dL (ref 0.0–149.0)
VLDL: 14 mg/dL (ref 0.0–40.0)

## 2023-03-28 ENCOUNTER — Ambulatory Visit (INDEPENDENT_AMBULATORY_CARE_PROVIDER_SITE_OTHER): Payer: 59 | Admitting: Psychology

## 2023-03-28 DIAGNOSIS — Z630 Problems in relationship with spouse or partner: Secondary | ICD-10-CM | POA: Diagnosis not present

## 2023-03-28 DIAGNOSIS — F900 Attention-deficit hyperactivity disorder, predominantly inattentive type: Secondary | ICD-10-CM

## 2023-03-28 DIAGNOSIS — F419 Anxiety disorder, unspecified: Secondary | ICD-10-CM | POA: Diagnosis not present

## 2023-03-28 DIAGNOSIS — F331 Major depressive disorder, recurrent, moderate: Secondary | ICD-10-CM | POA: Diagnosis not present

## 2023-03-28 NOTE — Progress Notes (Unsigned)
                Jarah Pember G Deniya Craigo, LCSW

## 2023-04-02 ENCOUNTER — Ambulatory Visit (INDEPENDENT_AMBULATORY_CARE_PROVIDER_SITE_OTHER): Payer: 59 | Admitting: Psychology

## 2023-04-02 DIAGNOSIS — Z630 Problems in relationship with spouse or partner: Secondary | ICD-10-CM

## 2023-04-02 DIAGNOSIS — F419 Anxiety disorder, unspecified: Secondary | ICD-10-CM

## 2023-04-02 DIAGNOSIS — F900 Attention-deficit hyperactivity disorder, predominantly inattentive type: Secondary | ICD-10-CM

## 2023-04-02 DIAGNOSIS — F331 Major depressive disorder, recurrent, moderate: Secondary | ICD-10-CM | POA: Diagnosis not present

## 2023-04-02 NOTE — Progress Notes (Unsigned)
                Kimi Bordeau G Haileigh Pitz, LCSW

## 2023-04-03 ENCOUNTER — Encounter: Payer: Self-pay | Admitting: Family Medicine

## 2023-04-03 ENCOUNTER — Telehealth (INDEPENDENT_AMBULATORY_CARE_PROVIDER_SITE_OTHER): Payer: 59 | Admitting: Family Medicine

## 2023-04-03 VITALS — Ht 62.4 in | Wt 101.0 lb

## 2023-04-03 DIAGNOSIS — F325 Major depressive disorder, single episode, in full remission: Secondary | ICD-10-CM

## 2023-04-03 DIAGNOSIS — F411 Generalized anxiety disorder: Secondary | ICD-10-CM | POA: Diagnosis not present

## 2023-04-03 MED ORDER — VENLAFAXINE HCL ER 225 MG PO TB24: ORAL_TABLET | ORAL | 3 refills | Status: DC

## 2023-04-03 NOTE — Patient Instructions (Signed)
In setting of suicidal thoughts or worsening symptoms please because after hours emergency line at [phone]

## 2023-04-03 NOTE — Progress Notes (Signed)
VIRTUAL VISIT A virtual visit is felt to be most appropriate for this patient at this time.   I connected with the patient on 04/03/23 at  4:00 PM EST by virtual telehealth platform and verified that I am speaking with the correct person using two identifiers.   I discussed the limitations, risks, security and privacy concerns of performing an evaluation and management service by  virtual telehealth platform and the availability of in person appointments. I also discussed with the patient that there may be a patient responsible charge related to this service. The patient expressed understanding and agreed to proceed.  Patient location: Home Provider Location: [facility] Participants: Kerby Nora and Merian Capron   Chief Complaint  Patient presents with   Depression   Anxiety    History of Present Illness:  62 y.o. female patient of Tricia Munoz E, MD presents with mood issues  Acute seeing Bambi Cottle, psychology,  for major depressive disorder, marital conflict and ADD.  Last office visits December 13 and December 18  Per my last note from June 2024 she was doing well with major depressive disorder, generalized anxiety, ADHD with venlafaxine 150 mg XR daily. The last 8 weeks she has had significant worsening in her mood.  She has been under  more stress with extra marital affair ending and resulting stalking .. worsened depression and anxiety.  Tearful, moody, anhedonia.  Decreased motivation, energy. Having trouble making herself do anything, today is a good day and she cream 2 g.  She has not decorated for the holidays as usual.  Very overwhelmed.   Sleeping well at night surprisingly. She is working on her manage with counseling.  She has had occ suicidal Thought... she has not had these in a while. Her belief in God keeps her from having more of these thoughts or a PLAN.    04/03/2023    4:02 PM 10/10/2022    3:35 PM 06/06/2022    3:16 PM  Depression screen  PHQ 2/9  Decreased Interest 0 0 0  Down, Depressed, Hopeless 3 0 0  PHQ - 2 Score 3 0 0  Altered sleeping 0    Tired, decreased energy 3    Change in appetite 3    Feeling bad or failure about yourself  3    Trouble concentrating 3    Moving slowly or fidgety/restless 3    Suicidal thoughts 1    PHQ-9 Score 19    Difficult doing work/chores Extremely dIfficult          04/03/2023    4:04 PM 10/10/2022    3:35 PM 06/06/2022    3:16 PM 10/08/2017   11:22 AM  GAD 7 : Generalized Anxiety Score  Nervous, Anxious, on Edge 3 0 0 3  Control/stop worrying 1 0 0 1  Worry too much - different things 3 0 0 0  Trouble relaxing 3 0 0 3  Restless 3 0 0 3  Easily annoyed or irritable 2 0 0 3  Afraid - awful might happen 3 0 0 2  Total GAD 7 Score 18 0 0 15  Anxiety Difficulty Extremely difficult   Somewhat difficult         COVID 19 screen No recent travel or known exposure to COVID19 The patient denies respiratory symptoms of COVID 19 at this time.  The importance of social distancing was discussed today.   Review of Systems  Constitutional:  Negative for chills and fever.  HENT:  Negative for congestion and ear pain.   Eyes:  Negative for pain and redness.  Respiratory:  Negative for cough and shortness of breath.   Cardiovascular:  Negative for chest pain, palpitations and leg swelling.  Gastrointestinal:  Negative for abdominal pain, blood in stool, constipation, diarrhea, nausea and vomiting.  Genitourinary:  Negative for dysuria.  Musculoskeletal:  Negative for falls and myalgias.  Skin:  Negative for rash.  Neurological:  Negative for dizziness.  Psychiatric/Behavioral:  Positive for depression and suicidal ideas. Negative for hallucinations and substance abuse. The patient is nervous/anxious. The patient does not have insomnia.       Past Medical History:  Diagnosis Date   ADHD    Asthma    Head injury 08/04/2017   Hypertension    Memory difficulties 10/08/2017   OA  (osteoarthritis)    Osteopenia     reports that she has never smoked. She has never used smokeless tobacco. She reports that she does not drink alcohol and does not use drugs.   Current Outpatient Medications:    albuterol (VENTOLIN HFA) 108 (90 Base) MCG/ACT inhaler, Inhale 2 puffs into the lungs every 4 (four) hours as needed for wheezing or shortness of breath., Disp: 18 g, Rfl: 2   azelastine (OPTIVAR) 0.05 % ophthalmic solution, Place 1 drop into both eyes 2 (two) times daily., Disp: 6 mL, Rfl: 11   Azelastine-Fluticasone 137-50 MCG/ACT SUSP, 2 spray in each nostril daily, Disp: 23 g, Rfl: 11   benazepril (LOTENSIN) 10 MG tablet, TAKE 1 TABLET BY MOUTH EVERY DAY, Disp: 90 tablet, Rfl: 3   budesonide-formoterol (SYMBICORT) 80-4.5 MCG/ACT inhaler, Inhale 2 puffs into the lungs in the morning and at bedtime., Disp: 1 each, Rfl: 0   Cholecalciferol (VITAMIN D) 2000 units CAPS, Take by mouth., Disp: , Rfl:    hydrochlorothiazide (HYDRODIURIL) 12.5 MG tablet, Take 1 tablet (12.5 mg total) by mouth daily., Disp: 90 tablet, Rfl: 3   hydrocortisone (PROCTO-MED HC) 2.5 % rectal cream, Place 1 Application rectally 2 (two) times daily. (Patient taking differently: Place 1 Application rectally 2 (two) times daily. As needed), Disp: 30 g, Rfl: 0   levocetirizine (XYZAL) 5 MG tablet, TAKE ONE TABLET BY MOUTH EACH EVENING, Disp: 30 tablet, Rfl: 11   Multiple Vitamins-Minerals (WOMENS MULTIVITAMIN PO), Take by mouth., Disp: , Rfl:    simvastatin (ZOCOR) 20 MG tablet, Take 1 tablet (20 mg total) by mouth daily., Disp: 30 tablet, Rfl:    triazolam (HALCION) 0.25 MG tablet, Prior to dental procedure, Disp: , Rfl:    Venlafaxine HCl 225 MG TB24, 1 TABLET PO TABLET DAILY, Disp: 30 tablet, Rfl: 3   vitamin C (ASCORBIC ACID) 250 MG tablet, Take 250 mg by mouth daily., Disp: , Rfl:    Observations/Objective: Height 5' 2.4" (1.585 m), weight 101 lb (45.8 kg), last menstrual period 11/06/2012.  Physical  Exam Constitutional:      General: The patient is not in acute distress. Pulmonary:     Effort: Pulmonary effort is normal. No respiratory distress.  Neurological:     Mental Status: The patient is alert and oriented to person, place, and time.  Psychiatric:        Mood and Affect: Mood normal.        Behavior: Behavior normal.    Assessment and Plan MDD (major depressive disorder), single episode, in full remission [facility]) Assessment & Plan: Acute worsening of chronic issue. She is working on counseling and because, psychology.  Encouraged patient to  continue this. Given worsening of mood in setting of situational stress, I have recommended increasing venlafaxine XR to 225 mg p.o. daily. We discussed option of additional Wellbutrin as depression adjunct, but she feels she has tried this in the past without benefit. She denies focal panic attacks. Patient has emergency information on accessing helpline in setting of suicidal ideation. She will follow-up in 4 weeks for reevaluation   GAD (generalized anxiety disorder)  Other orders -     Venlafaxine HCl ER; 1 TABLET PO TABLET DAILY  Dispense: 30 tablet; Refill: 3      I discussed the assessment and treatment plan with the patient. The patient was provided an opportunity to ask questions and all were answered. The patient agreed with the plan and demonstrated an understanding of the instructions.   The patient was advised to call back or seek an in-person evaluation if the symptoms worsen or if the condition fails to improve as anticipated.     Kerby Nora, MD

## 2023-04-03 NOTE — Assessment & Plan Note (Addendum)
Acute worsening of chronic issue. She is working on counseling and because, psychology.  Encouraged patient to continue this. Given worsening of mood in setting of situational stress, I have recommended increasing venlafaxine XR to 225 mg p.o. daily. We discussed option of additional Wellbutrin as depression adjunct, but she feels she has tried this in the past without benefit. She denies focal panic attacks. Patient has emergency information on accessing helpline in setting of suicidal ideation. She will follow-up in 4 weeks for reevaluation

## 2023-04-07 ENCOUNTER — Encounter: Payer: Self-pay | Admitting: Family Medicine

## 2023-04-07 MED ORDER — VENLAFAXINE HCL ER 150 MG PO CP24: 150.0000 mg | ORAL_CAPSULE | Freq: Every day | ORAL | 0 refills | Status: DC

## 2023-04-07 MED ORDER — VENLAFAXINE HCL ER 75 MG PO CP24: 75.0000 mg | ORAL_CAPSULE | Freq: Every day | ORAL | 0 refills | Status: DC

## 2023-04-15 ENCOUNTER — Ambulatory Visit (INDEPENDENT_AMBULATORY_CARE_PROVIDER_SITE_OTHER): Payer: 59 | Admitting: Psychology

## 2023-04-15 DIAGNOSIS — Z630 Problems in relationship with spouse or partner: Secondary | ICD-10-CM

## 2023-04-15 DIAGNOSIS — F331 Major depressive disorder, recurrent, moderate: Secondary | ICD-10-CM | POA: Diagnosis not present

## 2023-04-15 DIAGNOSIS — F900 Attention-deficit hyperactivity disorder, predominantly inattentive type: Secondary | ICD-10-CM | POA: Diagnosis not present
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
## 2023-04-22 ENCOUNTER — Ambulatory Visit (INDEPENDENT_AMBULATORY_CARE_PROVIDER_SITE_OTHER): Payer: 59 | Admitting: Psychology

## 2023-04-22 DIAGNOSIS — F900 Attention-deficit hyperactivity disorder, predominantly inattentive type: Secondary | ICD-10-CM

## 2023-04-22 DIAGNOSIS — F331 Major depressive disorder, recurrent, moderate: Secondary | ICD-10-CM | POA: Diagnosis not present

## 2023-04-22 DIAGNOSIS — Z630 Problems in relationship with spouse or partner: Secondary | ICD-10-CM | POA: Diagnosis not present
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
## 2023-05-01 ENCOUNTER — Ambulatory Visit (INDEPENDENT_AMBULATORY_CARE_PROVIDER_SITE_OTHER): Payer: 59 | Admitting: Psychology

## 2023-05-01 DIAGNOSIS — F331 Major depressive disorder, recurrent, moderate: Secondary | ICD-10-CM

## 2023-05-01 DIAGNOSIS — Z630 Problems in relationship with spouse or partner: Secondary | ICD-10-CM

## 2023-05-01 DIAGNOSIS — F900 Attention-deficit hyperactivity disorder, predominantly inattentive type: Secondary | ICD-10-CM
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
## 2023-05-08 ENCOUNTER — Ambulatory Visit
Admission: RE | Admit: 2023-05-08 | Discharge: 2023-05-08 | Disposition: A | Discharge status: Home / Self Care | Payer: 59 | Source: Ambulatory Visit | Attending: Family Medicine | Admitting: Family Medicine

## 2023-05-08 ENCOUNTER — Encounter: Payer: Self-pay | Admitting: Family Medicine

## 2023-05-08 DIAGNOSIS — M8588 Other specified disorders of bone density and structure, other site: Secondary | ICD-10-CM | POA: Diagnosis not present

## 2023-05-08 DIAGNOSIS — E2839 Other primary ovarian failure: Secondary | ICD-10-CM | POA: Diagnosis not present

## 2023-05-08 DIAGNOSIS — M8580 Other specified disorders of bone density and structure, unspecified site: Secondary | ICD-10-CM

## 2023-05-08 DIAGNOSIS — N958 Other specified menopausal and perimenopausal disorders: Secondary | ICD-10-CM | POA: Diagnosis not present

## 2023-05-08 DIAGNOSIS — Z90722 Acquired absence of ovaries, bilateral: Secondary | ICD-10-CM | POA: Diagnosis not present

## 2023-05-09 ENCOUNTER — Ambulatory Visit (INDEPENDENT_AMBULATORY_CARE_PROVIDER_SITE_OTHER): Payer: 59 | Admitting: Psychology

## 2023-05-09 DIAGNOSIS — F900 Attention-deficit hyperactivity disorder, predominantly inattentive type: Secondary | ICD-10-CM | POA: Diagnosis not present

## 2023-05-09 DIAGNOSIS — Z630 Problems in relationship with spouse or partner: Secondary | ICD-10-CM | POA: Diagnosis not present

## 2023-05-09 DIAGNOSIS — F331 Major depressive disorder, recurrent, moderate: Secondary | ICD-10-CM | POA: Diagnosis not present
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
## 2023-05-15 ENCOUNTER — Ambulatory Visit (INDEPENDENT_AMBULATORY_CARE_PROVIDER_SITE_OTHER): Payer: 59 | Admitting: Psychology

## 2023-05-15 DIAGNOSIS — Z630 Problems in relationship with spouse or partner: Secondary | ICD-10-CM | POA: Diagnosis not present

## 2023-05-15 DIAGNOSIS — F900 Attention-deficit hyperactivity disorder, predominantly inattentive type: Secondary | ICD-10-CM | POA: Diagnosis not present

## 2023-05-15 DIAGNOSIS — F331 Major depressive disorder, recurrent, moderate: Secondary | ICD-10-CM | POA: Diagnosis not present
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
## 2023-05-17 ENCOUNTER — Other Ambulatory Visit: Payer: Self-pay | Admitting: Family

## 2023-05-22 ENCOUNTER — Encounter: Payer: Self-pay | Admitting: Family Medicine

## 2023-05-22 ENCOUNTER — Ambulatory Visit (INDEPENDENT_AMBULATORY_CARE_PROVIDER_SITE_OTHER): Payer: 59 | Admitting: Psychology

## 2023-05-22 DIAGNOSIS — Z630 Problems in relationship with spouse or partner: Secondary | ICD-10-CM | POA: Diagnosis not present

## 2023-05-22 DIAGNOSIS — F900 Attention-deficit hyperactivity disorder, predominantly inattentive type: Secondary | ICD-10-CM

## 2023-05-22 DIAGNOSIS — F331 Major depressive disorder, recurrent, moderate: Secondary | ICD-10-CM
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
## 2023-05-23 NOTE — Telephone Encounter (Signed)
 Don't see where you had changed to 40 mg? Last note is from labs on 03/25/23 message as below   Significant improvement in LDL cholesterol with continued simvastatin  20 mg p.o. daily and lifestyle change.  We will continue to follow this yearly.

## 2023-05-27 ENCOUNTER — Other Ambulatory Visit: Payer: Self-pay | Admitting: Family Medicine

## 2023-05-27 MED ORDER — SIMVASTATIN 40 MG PO TABS: 20.0000 mg | ORAL_TABLET | Freq: Every day | ORAL | 3 refills | Status: DC

## 2023-05-27 NOTE — Progress Notes (Signed)
Sent in refill for medication at simvastatin 40 mg p.o. daily.

## 2023-05-27 NOTE — Telephone Encounter (Signed)
Last office visit 04/03/2023 for MDD.  Last refilled 04/03/2023 for #90 with no refills.  AVS states to follow up in 4 weeks around  05/01/23).  No future appointments.  Okay to refill?

## 2023-05-30 ENCOUNTER — Ambulatory Visit (INDEPENDENT_AMBULATORY_CARE_PROVIDER_SITE_OTHER): Payer: 59 | Admitting: Psychology

## 2023-05-30 DIAGNOSIS — F331 Major depressive disorder, recurrent, moderate: Secondary | ICD-10-CM | POA: Diagnosis not present

## 2023-05-30 DIAGNOSIS — Z630 Problems in relationship with spouse or partner: Secondary | ICD-10-CM
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
## 2023-06-03 ENCOUNTER — Ambulatory Visit (INDEPENDENT_AMBULATORY_CARE_PROVIDER_SITE_OTHER): Payer: 59 | Admitting: Psychology

## 2023-06-03 DIAGNOSIS — Z630 Problems in relationship with spouse or partner: Secondary | ICD-10-CM

## 2023-06-03 DIAGNOSIS — F331 Major depressive disorder, recurrent, moderate: Secondary | ICD-10-CM

## 2023-06-03 DIAGNOSIS — F900 Attention-deficit hyperactivity disorder, predominantly inattentive type: Secondary | ICD-10-CM | POA: Diagnosis not present

## 2023-06-03 NOTE — Progress Notes (Unsigned)
                edge,  experiencing concentration difficulties, having trouble falling or staying asleep, exhibiting a general  state of irritability).: No Description Entered (Status: improved). Motor tension (e.g., restlessness,  tiredness, shakiness, muscle tension).: No Description Entered (Status: improved).  Problems Addressed  Anxiety, Phase Of Life Problems, Anxiety  Goals 1. Learn and implement coping skills that result in a reduction of anxiety  and worry, and improved daily functioning. Objective Learn  and implement calming skills to reduce overall anxiety and manage anxiety symptoms. Target Date: 2025-08-09Frequency: Weekly Progress: 40 Modality: individual  Related Interventions 1. Teach the client calming/relaxation skills (e.g., applied relaxation, progressive muscle  relaxation, cue controlled relaxation; mindful breathing; biofeedback) and how to discriminate  better between relaxation and tension; teach the client how to apply these skills to his/her daily  life (e.g., New Directions in Progressive Muscle Relaxation by Marcelyn Ditty, and  Hazlett-Stevens; Treating Generalized Anxiety Disorder by Rygh and Ida Rogue). Objective Identify, challenge, and replace biased, fearful self-talk with positive, realistic, and empowering selftalk. Target Date: 2023-11-22 Frequency: weekly Progress: 30 Modality: individual Related Interventions 1. Explore the client's schema and self-talk that mediate his/her fear response; assist him/her in  challenging the biases; replace the distorted messages with reality-based alternatives and  positive, realistic self-talk that will increase his/her self-confidence in coping with irrational  fears (see Cognitive Therapy of Anxiety Disorders by Laurence Slate). Objective Learn and implement problem-solving strategies for realistically addressing worries. Target Date: 2025-08-09Frequency: weekly Progress: 40 Modality: individual 2. Resolve conflicted feelings and adapt to the new life circumstances. Objective Apply problem-solving skills to current circumstances. Target Date: 2023-11-22 Frequency: weekly Progress: 20 Modality: individual Related Interventions 1. Teach the client problem-resolution skills (e.g., defining the problem clearly, brainstorming  multiple solutions, listing the pros and cons of each solution, seeking input from others,  selecting and implementing a plan of action, evaluating outcome, and readjusting plan as   necessary).   3. Stabilize anxiety level while increasing ability to function on a daily  basis. Diagnosis F33.1  Major depressive disorder, moderate 300.02 (Generalized anxiety disorder) - Open - [Signifier: n/a]  Axis  none 309.28 (Adjustment disorder with mixed anxiety and depressed  mood) - Open - [Signifier: n/a]  Adjustment Disorder,  With Anxiety   Marital conflict  Major Depressive disorder, moderate  Conditions For Discharge Achievement of treatment goals and objectives.  The patient approved this plan.   Deonna Krummel G Ethridge Sollenberger, LCSW

## 2023-06-04 MED ORDER — SIMVASTATIN 40 MG PO TABS: 40.0000 mg | ORAL_TABLET | Freq: Every day | ORAL | 3 refills | Status: AC

## 2023-06-04 NOTE — Telephone Encounter (Signed)
Please see my chart note. Simvastatin 40 mg on med list has take 1/2 tab but my chart note has take one tab or 40 mg. Sending to Dr Ermalene Searing for clarification. Thank you.

## 2023-06-12 ENCOUNTER — Ambulatory Visit (INDEPENDENT_AMBULATORY_CARE_PROVIDER_SITE_OTHER): Payer: 59 | Admitting: Psychology

## 2023-06-12 DIAGNOSIS — F331 Major depressive disorder, recurrent, moderate: Secondary | ICD-10-CM | POA: Diagnosis not present

## 2023-06-12 DIAGNOSIS — F900 Attention-deficit hyperactivity disorder, predominantly inattentive type: Secondary | ICD-10-CM | POA: Diagnosis not present

## 2023-06-12 DIAGNOSIS — Z630 Problems in relationship with spouse or partner: Secondary | ICD-10-CM

## 2023-06-12 NOTE — Progress Notes (Unsigned)
                edge,  experiencing concentration difficulties, having trouble falling or staying asleep, exhibiting a general  state of irritability).: No Description Entered (Status: improved). Motor tension (e.g., restlessness,  tiredness, shakiness, muscle tension).: No Description Entered (Status: improved).  Problems Addressed  Anxiety, Phase Of Life Problems, Anxiety  Goals 1. Learn and implement coping skills that result in a reduction of anxiety  and worry, and improved daily functioning. Objective Learn  and implement calming skills to reduce overall anxiety and manage anxiety symptoms. Target Date: 2025-08-09Frequency: Weekly Progress: 40 Modality: individual  Related Interventions 1. Teach the client calming/relaxation skills (e.g., applied relaxation, progressive muscle  relaxation, cue controlled relaxation; mindful breathing; biofeedback) and how to discriminate  better between relaxation and tension; teach the client how to apply these skills to his/her daily  life (e.g., New Directions in Progressive Muscle Relaxation by Marcelyn Ditty, and  Hazlett-Stevens; Treating Generalized Anxiety Disorder by Rygh and Ida Rogue). Objective Identify, challenge, and replace biased, fearful self-talk with positive, realistic, and empowering selftalk. Target Date: 2023-11-22 Frequency: weekly Progress: 30 Modality: individual Related Interventions 1. Explore the client's schema and self-talk that mediate his/her fear response; assist him/her in  challenging the biases; replace the distorted messages with reality-based alternatives and  positive, realistic self-talk that will increase his/her self-confidence in coping with irrational  fears (see Cognitive Therapy of Anxiety Disorders by Laurence Slate). Objective Learn and implement problem-solving strategies for realistically addressing worries. Target Date: 2025-08-09Frequency: weekly Progress: 40 Modality: individual 2. Resolve conflicted feelings and adapt to the new life circumstances. Objective Apply problem-solving skills to current circumstances. Target Date: 2023-11-22 Frequency: weekly Progress: 20 Modality: individual Related Interventions 1. Teach the client problem-resolution skills (e.g., defining the problem clearly, brainstorming  multiple solutions, listing the pros and cons of each solution, seeking input from others,  selecting and implementing a plan of action, evaluating outcome, and readjusting plan as   necessary).   3. Stabilize anxiety level while increasing ability to function on a daily  basis. Diagnosis F33.1  Major depressive disorder, moderate 300.02 (Generalized anxiety disorder) - Open - [Signifier: n/a]  Axis  none 309.28 (Adjustment disorder with mixed anxiety and depressed  mood) - Open - [Signifier: n/a]  Adjustment Disorder,  With Anxiety   Marital conflict  Major Depressive disorder, moderate  Conditions For Discharge Achievement of treatment goals and objectives.  The patient approved this plan.   Deonna Krummel G Ethridge Sollenberger, LCSW

## 2023-06-19 ENCOUNTER — Ambulatory Visit (INDEPENDENT_AMBULATORY_CARE_PROVIDER_SITE_OTHER): Payer: 59 | Admitting: Psychology

## 2023-06-19 DIAGNOSIS — F331 Major depressive disorder, recurrent, moderate: Secondary | ICD-10-CM | POA: Diagnosis not present

## 2023-06-19 DIAGNOSIS — Z630 Problems in relationship with spouse or partner: Secondary | ICD-10-CM | POA: Diagnosis not present

## 2023-06-19 DIAGNOSIS — F900 Attention-deficit hyperactivity disorder, predominantly inattentive type: Secondary | ICD-10-CM
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
## 2023-06-27 ENCOUNTER — Ambulatory Visit (INDEPENDENT_AMBULATORY_CARE_PROVIDER_SITE_OTHER): Payer: 59 | Admitting: Psychology

## 2023-06-27 DIAGNOSIS — F900 Attention-deficit hyperactivity disorder, predominantly inattentive type: Secondary | ICD-10-CM | POA: Diagnosis not present

## 2023-06-27 DIAGNOSIS — F331 Major depressive disorder, recurrent, moderate: Secondary | ICD-10-CM

## 2023-06-27 DIAGNOSIS — Z630 Problems in relationship with spouse or partner: Secondary | ICD-10-CM
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
## 2023-07-03 ENCOUNTER — Ambulatory Visit (INDEPENDENT_AMBULATORY_CARE_PROVIDER_SITE_OTHER): Payer: 59 | Admitting: Psychology

## 2023-07-03 DIAGNOSIS — F900 Attention-deficit hyperactivity disorder, predominantly inattentive type: Secondary | ICD-10-CM | POA: Diagnosis not present

## 2023-07-03 DIAGNOSIS — Z630 Problems in relationship with spouse or partner: Secondary | ICD-10-CM

## 2023-07-03 DIAGNOSIS — F331 Major depressive disorder, recurrent, moderate: Secondary | ICD-10-CM

## 2023-07-03 NOTE — Progress Notes (Signed)
 [facility] Counselor/Therapist Progress Note  Patient ID: Tricia Munoz, MRN: [id number],    Date: 07/03/2023  Time Spent: 56 minutes  Time in:  10:02  Time out: 10:58  Treatment Type: Individual Therapy  Reported Symptoms: sadness, anxiety  Mental Status Exam: Appearance:  Casual     Behavior: Appropriate  Motor: Normal  Speech/Language:  Normal Rate  Affect: blunted  Mood: sad  Thought process: normal  Thought content:   WNL  Sensory/Perceptual disturbances:   WNL  Orientation: oriented to person, place, time/date, and situation  Attention: Good  Concentration: Good  Memory: WNL  Fund of knowledge:  Good  Insight:   Good  Judgment:  poor  Impulse Control: poor   Risk Assessment: Danger to Self:  No Self-injurious Behavior: No Danger to Others: No Duty to Warn:no Physical Aggression / Violence:No  Access to Firearms a concern: No  Gang Involvement:No   Subjective: The patient attended a face-to-face individual therapy session via video visit today.  The patient gave consent for the video visit to be on caregility and she is aware of the limitations of telehealth.  The patient was in her home alone and the therapist was in her home office.  The patient presents with a blunted affect and her mood is sad.  The patient reports that she has been thinking and Lewis called her baking and she had not told him that she was doing that.  We talked about how that probably created a lack of trust with her again and she was very self-deprecating and struggling with beating herself up.  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  We talked about how she is using things to avoid dealing with her problems and in addition we talked about her being an extreme caregiver and that that is part of the problem that she has.  We talked about getting back on track and being more transparent  and talked about how to handle the situation with her husband.  Interventions: Cognitive Behavioral Therapy, Insight-Oriented, Family Systems, and Interpersonal  Diagnosis:Major depressive disorder, recurrent episode, moderate (HCC)  Marital conflict  Attention deficit hyperactivity disorder (ADHD), predominantly inattentive type  Plan:Client Abilities/Strengths  Intelligent, insightful, supportive husband, sober  Client Treatment Preferences  Outpatient Individual therapy weekly or bi weekly  Client Statement of Needs  "I want to be happy with myself"  Treatment Level  Outpatient Individual therapy  Symptoms  Difficulty in saying no to others; assumes not being liked by others.: No Description Entered (Status:  improved). Fear of rejection by others, especially peer group.: No Description Entered (Status:  improved).  Problems Addressed  Low Self-Esteem, Low Self-Esteem, Low Self-Esteem  Goals 1. Demonstrate improved self-esteem through more pride in appearance,  more assertiveness, greater eye contact, and identification of  positive traits in self-talk messages. 2. Elevate self-esteem. 3. Establish an inward sense of self-worth, confidence, and competence. Objective Identify and replace negative self-talk messages used to reinforce low self-esteem. Target Date: 04/01/2024 Frequency: Weekly Progress: 40 Modality: individual Related Interventions 1. Help the client identify his/her distorted, negative beliefs about self and the world and replace  these messages with more realistic, affirmative messages (or assign "Journal and Replace SelfDefeating Thoughts" in the Adult Psychotherapy Homework Planner by [facility] or read What  to Say When You Talk to Yourself by [employer]). Objective Identify and engage in activities that would improve self-image by  being consistent with one's values. Target Date: 04/01/2024 Frequency: Weekly Progress: 30 Modality:  individual Objective Demonstrate an increased ability to identify and express personal feelings. Target Date: 04/01/2024 Frequency: Weekly Progress: 10 Modality: individual Diagnosis Axis none 296.31 (Major depressive affective  disorder, recurrent episode, mild) -   Adjustment  Disorder  Unspecified  Conditions For Discharge Achievement of treatment goals and objectives   Patient approved this treatment plan.  Jeanice Dempsey G Rosan Calbert, LCSW

## 2023-07-10 ENCOUNTER — Ambulatory Visit (INDEPENDENT_AMBULATORY_CARE_PROVIDER_SITE_OTHER): Payer: 59 | Admitting: Psychology

## 2023-07-10 DIAGNOSIS — F331 Major depressive disorder, recurrent, moderate: Secondary | ICD-10-CM | POA: Diagnosis not present

## 2023-07-10 DIAGNOSIS — Z630 Problems in relationship with spouse or partner: Secondary | ICD-10-CM | POA: Diagnosis not present

## 2023-07-10 DIAGNOSIS — F900 Attention-deficit hyperactivity disorder, predominantly inattentive type: Secondary | ICD-10-CM

## 2023-07-10 NOTE — Progress Notes (Unsigned)
                edge,  experiencing concentration difficulties, having trouble falling or staying asleep, exhibiting a general  state of irritability).: No Description Entered (Status: improved). Motor tension (e.g., restlessness,  tiredness, shakiness, muscle tension).: No Description Entered (Status: improved).  Problems Addressed  Anxiety, Phase Of Life Problems, Anxiety  Goals 1. Learn and implement coping skills that result in a reduction of anxiety  and worry, and improved daily functioning. Objective Learn  and implement calming skills to reduce overall anxiety and manage anxiety symptoms. Target Date: 2025-08-09Frequency: Weekly Progress: 40 Modality: individual  Related Interventions 1. Teach the client calming/relaxation skills (e.g., applied relaxation, progressive muscle  relaxation, cue controlled relaxation; mindful breathing; biofeedback) and how to discriminate  better between relaxation and tension; teach the client how to apply these skills to his/her daily  life (e.g., New Directions in Progressive Muscle Relaxation by Marcelyn Ditty, and  Hazlett-Stevens; Treating Generalized Anxiety Disorder by Rygh and Ida Rogue). Objective Identify, challenge, and replace biased, fearful self-talk with positive, realistic, and empowering selftalk. Target Date: 2023-11-22 Frequency: weekly Progress: 30 Modality: individual Related Interventions 1. Explore the client's schema and self-talk that mediate his/her fear response; assist him/her in  challenging the biases; replace the distorted messages with reality-based alternatives and  positive, realistic self-talk that will increase his/her self-confidence in coping with irrational  fears (see Cognitive Therapy of Anxiety Disorders by Laurence Slate). Objective Learn and implement problem-solving strategies for realistically addressing worries. Target Date: 2025-08-09Frequency: weekly Progress: 40 Modality: individual 2. Resolve conflicted feelings and adapt to the new life circumstances. Objective Apply problem-solving skills to current circumstances. Target Date: 2023-11-22 Frequency: weekly Progress: 20 Modality: individual Related Interventions 1. Teach the client problem-resolution skills (e.g., defining the problem clearly, brainstorming  multiple solutions, listing the pros and cons of each solution, seeking input from others,  selecting and implementing a plan of action, evaluating outcome, and readjusting plan as   necessary).   3. Stabilize anxiety level while increasing ability to function on a daily  basis. Diagnosis F33.1  Major depressive disorder, moderate 300.02 (Generalized anxiety disorder) - Open - [Signifier: n/a]  Axis  none 309.28 (Adjustment disorder with mixed anxiety and depressed  mood) - Open - [Signifier: n/a]  Adjustment Disorder,  With Anxiety   Marital conflict  Major Depressive disorder, moderate  Conditions For Discharge Achievement of treatment goals and objectives.  The patient approved this plan.   Deonna Krummel G Ethridge Sollenberger, LCSW

## 2023-07-16 ENCOUNTER — Ambulatory Visit (INDEPENDENT_AMBULATORY_CARE_PROVIDER_SITE_OTHER): Admitting: Family Medicine

## 2023-07-16 ENCOUNTER — Encounter: Payer: Self-pay | Admitting: Family Medicine

## 2023-07-16 VITALS — BP 148/80 | HR 107 | Temp 98.1°F | Ht 62.4 in | Wt 111.0 lb

## 2023-07-16 DIAGNOSIS — K649 Unspecified hemorrhoids: Secondary | ICD-10-CM | POA: Diagnosis not present

## 2023-07-16 MED ORDER — HYDROCORT-PRAMOXINE (PERIANAL) 2.5-1 % EX CREA: TOPICAL_CREAM | Freq: Three times a day (TID) | CUTANEOUS | 0 refills | Status: DC

## 2023-07-16 MED ORDER — HYDROCORTISONE ACETATE 25 MG RE SUPP: 25.0000 mg | Freq: Every day | RECTAL | 0 refills | Status: AC

## 2023-07-16 NOTE — Assessment & Plan Note (Signed)
 Acute flare of chronic issue Did not perform rectal exam today given patient had recent colonoscopy as well as rectal exam within the last 6 months. At that time her exam showed external hemorrhoids and rectal tags but no anoscopy was performed. Her symptoms of itching correspond most with internal hemorrhoids so we will treat empirically with hydrocortisone 25 mg suppositories as well as with external cream containing hydrocortisone and pramoxine.  She will continue working on water, fiber. If her symptoms are not improving as expected she will return for a anoscopy. Of note she does not have constipation but instead may have rectal irritation from recent loose stool.

## 2023-07-16 NOTE — Patient Instructions (Signed)
 Start rectal suppositories for 5 days for internal hemorrhoids.  Can also apply cream up to TID cream for external pain or itching.  Follow up if not improving as expected after steroid course.  Continue increased water, fiber in diet and treatment of any constipation.

## 2023-07-16 NOTE — Progress Notes (Signed)
 A   Patient ID: Tricia Munoz, female    DOB: 01-22-1961, 63 y.o.   MRN: [id number]  This visit was conducted in person.  BP (!) 148/80   Pulse (!) 107   Temp 98.1 F (36.7 C) (Temporal)   Ht 5' 2.4" (1.585 m)   Wt 111 lb (50.3 kg)   LMP 11/06/2012   SpO2 95%   BMI 20.04 kg/m    CC:  Chief Complaint  Patient presents with   Hemorrhoids    Patient complains of pains with hemorrhoids for 3 weeks     Subjective:   HPI: Tricia Munoz is a 63 y.o. female presenting on 07/16/2023 for Hemorrhoids (Patient complains of pains with hemorrhoids for 3 weeks )  Had a spell of diarrhea... few weeks ago... started after this.   She has had issues with hemorrhoids in past. Seen 05/2022 external hemorrhoid  Colonoscopy nml 11/2022  Has itching, swelling... using topical steroid cream.. per rectum.. Pain with sitting occ.. none now. No blood.  Drinking lots of fiber, water.. no constipation.     BP Readings from Last 3 Encounters:  07/16/23 (!) 148/80  11/27/22 132/80  10/10/22 138/76     Relevant past medical, surgical, family and social history reviewed and updated as indicated. Interim medical history since our last visit reviewed. Allergies and medications reviewed and updated. Outpatient Medications Prior to Visit  Medication Sig Dispense Refill   albuterol (VENTOLIN HFA) 108 (90 Base) MCG/ACT inhaler Inhale 2 puffs into the lungs every 4 (four) hours as needed for wheezing or shortness of breath. 18 g 2   azelastine (OPTIVAR) 0.05 % ophthalmic solution Place 1 drop into both eyes 2 (two) times daily. 6 mL 11   Azelastine-Fluticasone 137-50 MCG/ACT SUSP 2 spray in each nostril daily 23 g 11   benazepril (LOTENSIN) 10 MG tablet TAKE 1 TABLET BY MOUTH EVERY DAY 90 tablet 3   budesonide-formoterol (SYMBICORT) 80-4.5 MCG/ACT inhaler Inhale 2 puffs into the lungs in the morning and at bedtime. 1 each 0   Cholecalciferol (VITAMIN D) 2000 units CAPS Take by mouth.      hydrochlorothiazide (HYDRODIURIL) 12.5 MG tablet Take 1 tablet (12.5 mg total) by mouth daily. 90 tablet 3   levocetirizine (XYZAL) 5 MG tablet TAKE ONE TABLET BY MOUTH EACH EVENING 30 tablet 11   Multiple Vitamins-Minerals (WOMENS MULTIVITAMIN PO) Take by mouth.     simvastatin (ZOCOR) 40 MG tablet Take 1 tablet (40 mg total) by mouth daily. 90 tablet 3   triazolam (HALCION) 0.25 MG tablet Prior to dental procedure     venlafaxine XR (EFFEXOR-XR) 150 MG 24 hr capsule Take 1 capsule (150 mg total) by mouth daily with breakfast. 90 capsule 0   venlafaxine XR (EFFEXOR-XR) 75 MG 24 hr capsule TAKE 1 CAPSULE BY MOUTH DAILY WITH BREAKFAST. 90 capsule 1   vitamin C (ASCORBIC ACID) 250 MG tablet Take 250 mg by mouth daily.     hydrocortisone (PROCTO-MED HC) 2.5 % rectal cream Place 1 Application rectally 2 (two) times daily. (Patient taking differently: Place 1 Application rectally 2 (two) times daily. As needed) 30 g 0   No facility-administered medications prior to visit.     Per HPI unless specifically indicated in ROS section below Review of Systems  Constitutional:  Negative for fatigue and fever.  HENT:  Negative for congestion.   Eyes:  Negative for pain.  Respiratory:  Negative for cough and shortness of breath.   Cardiovascular:  Negative  for chest pain, palpitations and leg swelling.  Gastrointestinal:  Negative for abdominal pain.  Genitourinary:  Negative for dysuria and vaginal bleeding.  Musculoskeletal:  Negative for back pain.  Neurological:  Negative for syncope, light-headedness and headaches.  Psychiatric/Behavioral:  Negative for dysphoric mood.    Objective:  BP (!) 148/80   Pulse (!) 107   Temp 98.1 F (36.7 C) (Temporal)   Ht 5' 2.4" (1.585 m)   Wt 111 lb (50.3 kg)   LMP 11/06/2012   SpO2 95%   BMI 20.04 kg/m   Wt Readings from Last 3 Encounters:  07/16/23 111 lb (50.3 kg)  04/03/23 101 lb (45.8 kg)  11/27/22 105 lb 6.4 oz (47.8 kg)      Physical  Exam Constitutional:      General: She is not in acute distress.    Appearance: Normal appearance. She is well-developed. She is not ill-appearing or toxic-appearing.  HENT:     Head: Normocephalic.     Right Ear: Hearing, tympanic membrane, ear canal and external ear normal. Tympanic membrane is not erythematous, retracted or bulging.     Left Ear: Hearing, tympanic membrane, ear canal and external ear normal. Tympanic membrane is not erythematous, retracted or bulging.     Nose: No mucosal edema or rhinorrhea.     Right Sinus: No maxillary sinus tenderness or frontal sinus tenderness.     Left Sinus: No maxillary sinus tenderness or frontal sinus tenderness.     Mouth/Throat:     Pharynx: Uvula midline.  Eyes:     General: Lids are normal. Lids are everted, no foreign bodies appreciated.     Conjunctiva/sclera: Conjunctivae normal.     Pupils: Pupils are equal, round, and reactive to light.  Neck:     Thyroid: No thyroid mass or thyromegaly.     Vascular: No carotid bruit.     Trachea: Trachea normal.  Cardiovascular:     Rate and Rhythm: Normal rate and regular rhythm.     Pulses: Normal pulses.     Heart sounds: Normal heart sounds, S1 normal and S2 normal. No murmur heard.    No friction rub. No gallop.  Pulmonary:     Effort: Pulmonary effort is normal. No tachypnea or respiratory distress.     Breath sounds: Normal breath sounds. No decreased breath sounds, wheezing, rhonchi or rales.  Abdominal:     General: Bowel sounds are normal.     Palpations: Abdomen is soft.     Tenderness: There is no abdominal tenderness.  Musculoskeletal:     Cervical back: Normal range of motion and neck supple.  Skin:    General: Skin is warm and dry.     Findings: No rash.  Neurological:     Mental Status: She is alert.  Psychiatric:        Mood and Affect: Mood is not anxious or depressed.        Speech: Speech normal.        Behavior: Behavior normal. Behavior is cooperative.         Thought Content: Thought content normal.        Judgment: Judgment normal.       Results for orders placed or performed in visit on 03/24/23  Lipid panel   Collection Time: 03/24/23  8:18 AM  Result Value Ref Range   Cholesterol 204 (H) 0 - 200 mg/dL   Triglycerides 16.1 0.0 - 149.0 mg/dL   HDL 09.60 >45.40 mg/dL   VLDL  14.0 0.0 - 40.0 mg/dL   LDL Cholesterol 409 (H) 0 - 99 mg/dL   Total CHOL/HDL Ratio 3    NonHDL 128.05   Comprehensive metabolic panel   Collection Time: 03/24/23  8:18 AM  Result Value Ref Range   Sodium 137 135 - 145 mEq/L   Potassium 3.9 3.5 - 5.1 mEq/L   Chloride 101 96 - 112 mEq/L   CO2 31 19 - 32 mEq/L   Glucose, Bld 95 70 - 99 mg/dL   BUN 11 6 - 23 mg/dL   Creatinine, Ser 8.11 0.40 - 1.20 mg/dL   Total Bilirubin 0.4 0.2 - 1.2 mg/dL   Alkaline Phosphatase 95 39 - 117 U/L   AST 16 0 - 37 U/L   ALT 10 0 - 35 U/L   Total Protein 7.1 6.0 - 8.3 g/dL   Albumin 4.0 3.5 - 5.2 g/dL   GFR 91.47 >82.95 mL/min   Calcium 9.1 8.4 - 10.5 mg/dL    Assessment and Plan  Hemorrhoids, unspecified hemorrhoid type Assessment & Plan: Acute flare of chronic issue Did not perform rectal exam today given patient had recent colonoscopy as well as rectal exam within the last 6 months. At that time her exam showed external hemorrhoids and rectal tags but no anoscopy was performed. Her symptoms of itching correspond most with internal hemorrhoids so we will treat empirically with hydrocortisone 25 mg suppositories as well as with external cream containing hydrocortisone and pramoxine.  She will continue working on water, fiber. If her symptoms are not improving as expected she will return for a anoscopy. Of note she does not have constipation but instead may have rectal irritation from recent loose stool.   Other orders -     Hydrocortisone Acetate; Place 1 suppository (25 mg total) rectally daily.  Dispense: 6 suppository; Refill: 0 -     Hydrocort-Pramoxine (Perianal);  Place rectally 3 (three) times daily.  Dispense: 30 g; Refill: 0    No follow-ups on file.   Kerby Nora, MD

## 2023-08-07 ENCOUNTER — Ambulatory Visit (INDEPENDENT_AMBULATORY_CARE_PROVIDER_SITE_OTHER): Payer: 59 | Admitting: Psychology

## 2023-08-07 DIAGNOSIS — F900 Attention-deficit hyperactivity disorder, predominantly inattentive type: Secondary | ICD-10-CM | POA: Diagnosis not present

## 2023-08-07 DIAGNOSIS — Z630 Problems in relationship with spouse or partner: Secondary | ICD-10-CM | POA: Diagnosis not present

## 2023-08-07 DIAGNOSIS — F331 Major depressive disorder, recurrent, moderate: Secondary | ICD-10-CM

## 2023-08-07 NOTE — Progress Notes (Signed)
 [facility] Counselor/Therapist Progress Note  Patient ID: ONIKA GUDIEL, MRN: [id number],    Date: 08/07/2023  Time Spent: 60 minutes  Time in:  10:02  Time out: 11:02  Treatment Type: Individual Therapy  Reported Symptoms: sadness, anxiety  Mental Status Exam: Appearance:  Casual     Behavior: Appropriate  Motor: Normal  Speech/Language:  Normal Rate  Affect: blunted  Mood: sad  Thought process: normal  Thought content:   WNL  Sensory/Perceptual disturbances:   WNL  Orientation: oriented to person, place, time/date, and situation  Attention: Good  Concentration: Good  Memory: WNL  Fund of knowledge:  Good  Insight:   Good  Judgment:  poor  Impulse Control: poor   Risk Assessment: Danger to Self:  No Self-injurious Behavior: No Danger to Others: No Duty to Warn:no Physical Aggression / Violence:No  Access to Firearms a concern: No  Gang Involvement:No   Subjective: The patient attended a face-to-face individual therapy session via video visit today.  The patient gave consent for the video visit to be on caregility and she is aware of the limitations of telehealth.  The patient was in her home alone and the therapist was in her home office.  The patient presents with a blunted affect and her mood is sad.  Patient reports that her husband has been to therapy a couple of times and that seems to be going well.  The patient reports that she has been struggling somewhat over the last couple of weeks.  She states that she and Lewis have had some moments where things were difficult but she talked about Louis being critical of her and she finally was able to speak up for herself and help him know that that behavior was not acceptable and that he was being critical of her.  We talked about her calling him on it every time that he does it because I do not believe that he realizes what he is doing when he is doing it and that he is reacting as his father would have  reacted to him when he was young.  The patient has held her tongue the whole time they have been married and she had not been honest with him about some of the things that he has done that been hurtful to her and their marriage.  We talked about the situation being beneficial in lots of ways and that it has helped her be more aware of her own issues and be able to work on herself and become a different person.  She also talked today about her difficulties with being more social and we talked about ways that she could certainly implement small things to be more social yet still have some space to recover and not have to give quite as much as she may have done in the past.  The patient felt that the session was helpful and we are scheduled to meet again in a couple of weeks. Interventions: Cognitive Behavioral Therapy, Insight-Oriented, Family Systems, and Interpersonal  Diagnosis:Major depressive disorder, recurrent episode, moderate (HCC)  Marital conflict  Attention deficit hyperactivity disorder (ADHD), predominantly inattentive type  Plan:Client Abilities/Strengths  Intelligent, insightful, supportive husband, sober  Client Treatment Preferences  Outpatient Individual therapy weekly or bi weekly  Client Statement of Needs  "I want to be happy with myself"  Treatment Level  Outpatient Individual therapy  Symptoms  Difficulty in saying no to others; assumes not being liked by others.: No Description Entered (Status:  improved).  Fear of rejection by others, especially peer group.: No Description Entered (Status:  improved).  Problems Addressed  Low Self-Esteem, Low Self-Esteem, Low Self-Esteem  Goals 1. Demonstrate improved self-esteem through more pride in appearance,  more assertiveness, greater eye contact, and identification of  positive traits in self-talk messages. 2. Elevate self-esteem. 3. Establish an inward sense of self-worth, confidence, and competence. Objective Identify  and replace negative self-talk messages used to reinforce low self-esteem. Target Date: 04/01/2024 Frequency: Weekly Progress: 40 Modality: individual Related Interventions 1. Help the client identify his/her distorted, negative beliefs about self and the world and replace  these messages with more realistic, affirmative messages (or assign "Journal and Replace SelfDefeating Thoughts" in the Adult Psychotherapy Homework Planner by [facility] or read What  to Say When You Talk to Yourself by [employer]). Objective Identify and engage in activities that would improve self-image by being consistent with one's values. Target Date: 04/01/2024 Frequency: Weekly Progress: 30 Modality: individual Objective Demonstrate an increased ability to identify and express personal feelings. Target Date: 04/01/2024 Frequency: Weekly Progress: 10 Modality: individual Diagnosis Axis none 296.31 (Major depressive affective  disorder, recurrent episode, mild) -   Adjustment  Disorder  Unspecified  Conditions For Discharge Achievement of treatment goals and objectives   Patient approved this treatment plan.  Lamisha Roussell G Stacie Knutzen, LCSW

## 2023-08-14 ENCOUNTER — Ambulatory Visit: Payer: 59 | Admitting: Psychology

## 2023-08-15 ENCOUNTER — Encounter: Payer: Self-pay | Admitting: Family Medicine

## 2023-08-17 ENCOUNTER — Ambulatory Visit (HOSPITAL_COMMUNITY)
Admission: RE | Admit: 2023-08-17 | Discharge: 2023-08-17 | Disposition: A | Discharge status: Home / Self Care | Source: Ambulatory Visit | Attending: Family Medicine | Admitting: Family Medicine

## 2023-08-17 ENCOUNTER — Encounter (HOSPITAL_COMMUNITY): Payer: Self-pay

## 2023-08-17 VITALS — BP 130/81 | HR 74 | Temp 97.9°F | Resp 14

## 2023-08-17 DIAGNOSIS — R21 Rash and other nonspecific skin eruption: Secondary | ICD-10-CM | POA: Diagnosis not present

## 2023-08-17 DIAGNOSIS — A6920 Lyme disease, unspecified: Secondary | ICD-10-CM

## 2023-08-17 MED ORDER — DOXYCYCLINE HYCLATE 100 MG PO CAPS: 100.0000 mg | ORAL_CAPSULE | Freq: Two times a day (BID) | ORAL | 0 refills | Status: AC

## 2023-08-17 MED ORDER — ONDANSETRON 4 MG PO TBDP: 4.0000 mg | ORAL_TABLET | Freq: Two times a day (BID) | ORAL | 0 refills | Status: DC | PRN

## 2023-08-17 NOTE — ED Provider Notes (Addendum)
 [facility]    CSN: [id number] Arrival date & time: 08/17/23  1338      History   Chief Complaint Chief Complaint  Patient presents with   Rash    Possible Lyme disease. - Entered by patient   Insect Bite    HPI Tricia Munoz is a 63 y.o. female.   The history is provided by the patient. No language interpreter was used.  Rash Location: She had a deer tick bite on her left ankle 3 weeks ago. Now started having rash on her left ankle which has gotten bigger. Progression:  Spreading Context: insect bite/sting   Context: not animal contact and not exposure to similar rash   Context comment:  Hx of a tick bite on her left ankle and left hand several weeks ago. She had  flu like symptoms for severeal days with muscle aches, fatigue, fever which has since resolved now Relieved by:  Anti-itch cream Worsened by:  Nothing Associated symptoms comment:  Currently, no fever, and joint pain improved. She does have arthritis at baseline.    Past Medical History:  Diagnosis Date   ADHD    Asthma    Head injury 08/04/2017   Hypertension    Memory difficulties 10/08/2017   OA (osteoarthritis)    Osteopenia     Patient Active Problem List   Diagnosis Date Noted   Hemorrhoids 07/16/2023   External hemorrhoid, thrombosed 06/10/2022   Rheumatoid factor positive 09/14/2020   Allergic contact dermatitis due to cosmetics 05/24/2019   Adult ADHD (attention deficit hyperactivity disorder) 12/11/2017   Transient hypersomnia 12/11/2017   GAD (generalized anxiety disorder) 06/09/2017   Mild persistent asthma 12/03/2016   Perennial and seasonal allergic rhinitis 10/04/2015   HLD (hyperlipidemia) 07/05/2014   MDD (major depressive disorder), single episode, in full remission (HCC) 12/09/2013   HTN (hypertension) 03/18/2012   Osteoarthritis 02/17/2009   POLYARTHRITIS 02/17/2009   Osteopenia 02/17/2009    Past Surgical History:  Procedure Laterality Date   BIOPSY  11/27/2022    Procedure: BIOPSY;  Surgeon: Selena Daily, MD;  Location: [facility];  Service: Gastroenterology;;   COLONOSCOPY WITH PROPOFOL  N/A 11/27/2022   Procedure: COLONOSCOPY WITH PROPOFOL ;  Surgeon: Selena Daily, MD;  Location: [facility];  Service: Gastroenterology;  Laterality: N/A;   FOOT SURGERY      OB History   No obstetric history on file.      Home Medications    Prior to Admission medications   Medication Sig Start Date End Date Taking? Authorizing Provider  doxycycline  (VIBRAMYCIN ) 100 MG capsule Take 1 capsule (100 mg total) by mouth 2 (two) times daily for 14 days. 08/17/23 08/31/23 Yes Arn Lane, MD  ondansetron (ZOFRAN-ODT) 4 MG disintegrating tablet Take 1 tablet (4 mg total) by mouth 2 (two) times daily as needed for nausea or vomiting. 08/17/23  Yes Arn Lane, MD  albuterol  (VENTOLIN  HFA) 108 (90 Base) MCG/ACT inhaler Inhale 2 puffs into the lungs every 4 (four) hours as needed for wheezing or shortness of breath. 02/05/20   Koberlein, Junell C, MD  azelastine  (OPTIVAR ) 0.05 % ophthalmic solution Place 1 drop into both eyes 2 (two) times daily. 12/06/21   Copland, Jolena Nay, MD  Azelastine -Fluticasone  137-50 MCG/ACT SUSP 2 spray in each nostril daily 12/06/21   Copland, Jolena Nay, MD  benazepril  (LOTENSIN ) 10 MG tablet TAKE 1 TABLET BY MOUTH EVERY DAY 12/09/22   Bedsole, Amy E, MD  budesonide -formoterol  (SYMBICORT ) 80-4.5 MCG/ACT inhaler Inhale 2 puffs  into the lungs in the morning and at bedtime. 07/20/20   Reford Canterbury, MD  Cholecalciferol (VITAMIN D) 2000 units CAPS Take by mouth.    [provider]  hydrochlorothiazide  (HYDRODIURIL ) 12.5 MG tablet Take 1 tablet (12.5 mg total) by mouth daily. 10/10/22   Bedsole, Amy E, MD  hydrocortisone  (ANUSOL -HC) 25 MG suppository Place 1 suppository (25 mg total) rectally daily. 07/16/23   Judithann Novas, MD  hydrocortisone -pramoxine (ANALPRAM-HC) 2.5-1 % rectal cream Place rectally 3 (three) times daily.  07/16/23   Judithann Novas, MD  levocetirizine (XYZAL ) 5 MG tablet TAKE ONE TABLET BY MOUTH EACH EVENING 04/29/19   Aron, Talia M, MD  Multiple Vitamins-Minerals (WOMENS MULTIVITAMIN PO) Take by mouth.    [provider]  simvastatin  (ZOCOR ) 40 MG tablet Take 1 tablet (40 mg total) by mouth daily. 06/04/23   Judithann Novas, MD  triazolam (HALCION) 0.25 MG tablet Prior to dental procedure 09/12/20   [provider]  venlafaxine  XR (EFFEXOR -XR) 150 MG 24 hr capsule Take 1 capsule (150 mg total) by mouth daily with breakfast. 04/07/23   Bedsole, Amy E, MD  venlafaxine  XR (EFFEXOR -XR) 75 MG 24 hr capsule TAKE 1 CAPSULE BY MOUTH DAILY WITH BREAKFAST. 05/27/23   Bedsole, Amy E, MD  vitamin C (ASCORBIC ACID) 250 MG tablet Take 250 mg by mouth daily.    [provider]    Family History Family History  Problem Relation Age of Onset   Dementia Mother    Rheum arthritis Mother    Hypertension Father    Allergic rhinitis Father    Colon cancer Father    Lung cancer Father    Kidney failure Sister        s/p transplant   Breast cancer Maternal Aunt 61       1/2 sister   Angioedema Neg Hx    Asthma Neg Hx    Eczema Neg Hx    Immunodeficiency Neg Hx    Urticaria Neg Hx     Social History Social History   Tobacco Use   Smoking status: Never   Smokeless tobacco: Never  Vaping Use   Vaping status: Never Used  Substance Use Topics   Alcohol use: No   Drug use: No     Allergies   Evekeo  [amphetamine  sulfate], Latex, Montelukast , and Singulair  [montelukast  sodium]   Review of Systems Review of Systems  Skin:  Positive for rash.  All other systems reviewed and are negative.    Physical Exam Triage Vital Signs ED Triage Vitals  Encounter Vitals Group     BP 08/17/23 1354 130/81     Systolic BP Percentile --      Diastolic BP Percentile --      Pulse Rate 08/17/23 1354 74     Resp 08/17/23 1354 14     Temp 08/17/23 1354 97.9 F (36.6 C)     Temp Source  08/17/23 1354 Oral     SpO2 08/17/23 1354 97 %     Weight --      Height --      Head Circumference --      Peak Flow --      Pain Score 08/17/23 1356 8     Pain Loc --      Pain Education --      Exclude from Growth Chart --    No data found.  Updated Vital Signs BP 130/81 (BP Location: Left Arm)   Pulse 74  Temp 97.9 F (36.6 C) (Oral)   Resp 14   LMP 11/06/2012   SpO2 97%   Visual Acuity Right Eye Distance:   Left Eye Distance:   Bilateral Distance:    Right Eye Near:   Left Eye Near:    Bilateral Near:     Physical Exam Vitals and nursing note reviewed.  Cardiovascular:     Rate and Rhythm: Normal rate and regular rhythm.     Pulses: Normal pulses.     Heart sounds: Normal heart sounds. No murmur heard. Pulmonary:     Effort: Pulmonary effort is normal. No respiratory distress.     Breath sounds: Normal breath sounds. No wheezing.  Skin:    Comments: Large, round maculo-papular lesion on her left ankle laterally with central clearing. A very small bite lesion on her left knuckle.  Neurological:     General: No focal deficit present.     Mental Status: She is oriented to person, place, and time.      UC Treatments / Results  Labs (all labs ordered are listed, but only abnormal results are displayed) Labs Reviewed - No data to display  EKG   Radiology No results found.  Procedures Procedures (including critical care time)  Medications Ordered in UC Medications - No data to display  Initial Impression / Assessment and Plan / UC Course  I have reviewed the triage vital signs and the nursing notes.  Pertinent labs & imaging results that were available during my care of the patient were reviewed by me and considered in my medical decision making (see chart for details).  Clinical Course as of 08/17/23 1427  Sun Aug 17, 2023  1424 Erythema migrans lesion Recent hx of fever, myalgia, and fatigue, which has since resolved Concerning for Lyme  rash Will go ahead and treat without testing She mentioned hx of nausea with Doxy in the past Alternative treatment with a different A/B offered\ However, she and her husband opted for Doxy with antiemetics Med escribed ED and PCP precautions discussed Close monitoring with PCP for the secondary manifestation of lymes dx discussed She agreed with the plan [KE]    Clinical Course User Index [KE] Arn Lane, MD     Final Clinical Impressions(s) / UC Diagnoses   Final diagnoses:  Rash and nonspecific skin eruption  Erythema migrans (Lyme disease)     Discharge Instructions      It was nice seeing you today. I am sorry about your rash and the symptoms.  Your description of the tick bite and your rash is concerning for Lymes disease. We will treat you for Lymes with Doxycycline , as discussed. Use Zofran as needed for nausea. Follow up with PCP in the next 3-4 days for monitoring.      ED Prescriptions     Medication Sig Dispense Auth. Provider   doxycycline  (VIBRAMYCIN ) 100 MG capsule Take 1 capsule (100 mg total) by mouth 2 (two) times daily for 14 days. 28 capsule Andrea Colglazier T, MD   ondansetron (ZOFRAN-ODT) 4 MG disintegrating tablet Take 1 tablet (4 mg total) by mouth 2 (two) times daily as needed for nausea or vomiting. 20 tablet Arn Lane, MD      PDMP not reviewed this encounter.   Arn Lane, MD 08/17/23 1427    Arn Lane, MD 08/17/23 [phone]

## 2023-08-17 NOTE — ED Triage Notes (Addendum)
 Patient reports that she was bitten by a tick on her left ankle and left hand. Patient has a rash to the left ankle area and states the area tot he left hand is itching and sore.  Patient states she was bitten by the tick several weeks ago and states she had flu-like symptoms and states that it lasted about 7-8 days before she felt better (sore throat, body aches, swollen lymph nodes)  Patient states she has been using anti itching cream.

## 2023-08-17 NOTE — Discharge Instructions (Addendum)
 It was nice seeing you today. I am sorry about your rash and the symptoms.  Your description of the tick bite and your rash is concerning for Lymes disease. We will treat you for Lymes with Doxycycline , as discussed. Use Zofran as needed for nausea. Follow up with PCP in the next 3-4 days for monitoring.

## 2023-08-19 ENCOUNTER — Encounter: Payer: Self-pay | Admitting: Family Medicine

## 2023-08-19 ENCOUNTER — Ambulatory Visit (INDEPENDENT_AMBULATORY_CARE_PROVIDER_SITE_OTHER): Admitting: Family Medicine

## 2023-08-19 ENCOUNTER — Telehealth: Payer: Self-pay | Admitting: Family Medicine

## 2023-08-19 VITALS — BP 158/90 | HR 82 | Temp 99.4°F | Ht 62.4 in | Wt 113.1 lb

## 2023-08-19 DIAGNOSIS — A6920 Lyme disease, unspecified: Secondary | ICD-10-CM | POA: Diagnosis not present

## 2023-08-19 LAB — CBC WITH DIFFERENTIAL/PLATELET
Basophils Absolute: 0.1 10*3/uL (ref 0.0–0.1)
Basophils Relative: 2.1 % (ref 0.0–3.0)
Eosinophils Absolute: 0.2 10*3/uL (ref 0.0–0.7)
Eosinophils Relative: 4 % (ref 0.0–5.0)
HCT: 39.9 % (ref 36.0–46.0)
Hemoglobin: 13.3 g/dL (ref 12.0–15.0)
Lymphocytes Relative: 40.6 % (ref 12.0–46.0)
Lymphs Abs: 1.7 10*3/uL (ref 0.7–4.0)
MCHC: 33.2 g/dL (ref 30.0–36.0)
MCV: 97.2 fl (ref 78.0–100.0)
Monocytes Absolute: 0.5 10*3/uL (ref 0.1–1.0)
Monocytes Relative: 11.2 % (ref 3.0–12.0)
Neutro Abs: 1.8 10*3/uL (ref 1.4–7.7)
Neutrophils Relative %: 42.1 % — ABNORMAL LOW (ref 43.0–77.0)
Platelets: 334 10*3/uL (ref 150.0–400.0)
RBC: 4.1 Mil/uL (ref 3.87–5.11)
RDW: 13.3 % (ref 11.5–15.5)
WBC: 4.2 10*3/uL (ref 4.0–10.5)

## 2023-08-19 LAB — COMPREHENSIVE METABOLIC PANEL WITH GFR
ALT: 15 U/L (ref 0–35)
AST: 19 U/L (ref 0–37)
Albumin: 4.5 g/dL (ref 3.5–5.2)
Alkaline Phosphatase: 105 U/L (ref 39–117)
BUN: 12 mg/dL (ref 6–23)
CO2: 32 meq/L (ref 19–32)
Calcium: 9.5 mg/dL (ref 8.4–10.5)
Chloride: 101 meq/L (ref 96–112)
Creatinine, Ser: 0.71 mg/dL (ref 0.40–1.20)
GFR: 89.63 mL/min (ref >60.00)
Glucose, Bld: 87 mg/dL (ref 70–99)
Potassium: 4.3 meq/L (ref 3.5–5.1)
Sodium: 139 meq/L (ref 135–145)
Total Bilirubin: 0.4 mg/dL (ref 0.2–1.2)
Total Protein: 7.2 g/dL (ref 6.0–8.3)

## 2023-08-19 MED ORDER — CEFTRIAXONE SODIUM 1 G IJ SOLR
1.0000 g | Freq: Once | INTRAMUSCULAR | Status: AC
  Administered 2023-08-19: 1 g via INTRAMUSCULAR

## 2023-08-19 NOTE — Progress Notes (Signed)
 Patient ID: Tricia Munoz, female    DOB: 1961/03/19, 63 y.o.   MRN: [id number]  This visit was conducted in person.  BP (!) 158/90   Pulse 82   Temp 99.4 F (37.4 C) (Temporal)   Ht 5' 2.4" (1.585 m)   Wt 113 lb 2 oz (51.3 kg)   LMP 11/06/2012   SpO2 97%   BMI 20.43 kg/m    CC:  Chief Complaint  Patient presents with   Follow-up    Urgent Care 08/17/2023 for Rash/Lyme Disease    Subjective:   HPI: Tricia Munoz is a 63 y.o. female presenting on 08/19/2023 for Follow-up (Urgent Care 08/17/2023 for Rash/Lyme Disease)  Reviewed recent Urgent Care OV for rash 08/17/2023  Deer tick bite 3 weeks ago... resulted in enlarging rash on right ankl, some central clearing. Noted subjective fever, myalgia , flu-like symptoms and fatigue, but symptoms resolved until last week noted rash on ankle. Was feeling better. Treated with doxy x 14 days    Today she reports She has taken 3 doses of the  antibiotics.  She has started feeling tired again, neck lymph nodes feel sore.  No new joint pain... achy but has arthritis.   She notes  neck stiffness.   Significant headache.  No  numbness, no weakness, no confusion.  She has nausea and SE to doxy... zofran helps but this may be cause of her starting to feel ill again   Good water intake, eating well.   Relevant past medical, surgical, family and social history reviewed and updated as indicated. Interim medical history since our last visit reviewed. Allergies and medications reviewed and updated. Outpatient Medications Prior to Visit  Medication Sig Dispense Refill   albuterol  (VENTOLIN  HFA) 108 (90 Base) MCG/ACT inhaler Inhale 2 puffs into the lungs every 4 (four) hours as needed for wheezing or shortness of breath. 18 g 2   azelastine  (OPTIVAR ) 0.05 % ophthalmic solution Place 1 drop into both eyes 2 (two) times daily. 6 mL 11   Azelastine -Fluticasone  137-50 MCG/ACT SUSP 2 spray in each nostril daily 23 g 11   benazepril  (LOTENSIN )  10 MG tablet TAKE 1 TABLET BY MOUTH EVERY DAY 90 tablet 3   budesonide -formoterol  (SYMBICORT ) 80-4.5 MCG/ACT inhaler Inhale 2 puffs into the lungs in the morning and at bedtime. 1 each 0   Cholecalciferol (VITAMIN D) 2000 units CAPS Take by mouth.     doxycycline  (VIBRAMYCIN ) 100 MG capsule Take 1 capsule (100 mg total) by mouth 2 (two) times daily for 14 days. 28 capsule 0   hydrochlorothiazide  (HYDRODIURIL ) 12.5 MG tablet Take 1 tablet (12.5 mg total) by mouth daily. 90 tablet 3   hydrocortisone  (ANUSOL -HC) 25 MG suppository Place 1 suppository (25 mg total) rectally daily. 6 suppository 0   hydrocortisone -pramoxine (ANALPRAM-HC) 2.5-1 % rectal cream Place rectally 3 (three) times daily. 30 g 0   levocetirizine (XYZAL ) 5 MG tablet TAKE ONE TABLET BY MOUTH EACH EVENING 30 tablet 11   Multiple Vitamins-Minerals (WOMENS MULTIVITAMIN PO) Take by mouth.     ondansetron (ZOFRAN-ODT) 4 MG disintegrating tablet Take 1 tablet (4 mg total) by mouth 2 (two) times daily as needed for nausea or vomiting. 20 tablet 0   simvastatin  (ZOCOR ) 40 MG tablet Take 1 tablet (40 mg total) by mouth daily. 90 tablet 3   triazolam (HALCION) 0.25 MG tablet Prior to dental procedure     venlafaxine  XR (EFFEXOR -XR) 150 MG 24 hr capsule Take 1 capsule (150  mg total) by mouth daily with breakfast. 90 capsule 0   venlafaxine  XR (EFFEXOR -XR) 75 MG 24 hr capsule TAKE 1 CAPSULE BY MOUTH DAILY WITH BREAKFAST. 90 capsule 1   vitamin C (ASCORBIC ACID) 250 MG tablet Take 250 mg by mouth daily.     No facility-administered medications prior to visit.     Per HPI unless specifically indicated in ROS section below Review of Systems  Constitutional:  Negative for fatigue and fever.  HENT:  Negative for congestion.   Eyes:  Negative for pain.  Respiratory:  Negative for cough and shortness of breath.   Cardiovascular:  Negative for chest pain, palpitations and leg swelling.  Gastrointestinal:  Negative for abdominal pain.   Genitourinary:  Negative for dysuria and vaginal bleeding.  Musculoskeletal:  Positive for neck stiffness. Negative for back pain.  Neurological:  Negative for syncope, light-headedness and headaches.  Psychiatric/Behavioral:  Negative for dysphoric mood.    Objective:  BP (!) 158/90   Pulse 82   Temp 99.4 F (37.4 C) (Temporal)   Ht 5' 2.4" (1.585 m)   Wt 113 lb 2 oz (51.3 kg)   LMP 11/06/2012   SpO2 97%   BMI 20.43 kg/m   Wt Readings from Last 3 Encounters:  08/19/23 113 lb 2 oz (51.3 kg)  07/16/23 111 lb (50.3 kg)  04/03/23 101 lb (45.8 kg)      Physical Exam Constitutional:      General: She is not in acute distress.    Appearance: Normal appearance. She is well-developed. She is not ill-appearing or toxic-appearing.  HENT:     Head: Normocephalic.     Right Ear: Hearing, tympanic membrane, ear canal and external ear normal. Tympanic membrane is not erythematous, retracted or bulging.     Left Ear: Hearing, tympanic membrane, ear canal and external ear normal. Tympanic membrane is not erythematous, retracted or bulging.     Nose: No mucosal edema or rhinorrhea.     Right Sinus: No maxillary sinus tenderness or frontal sinus tenderness.     Left Sinus: No maxillary sinus tenderness or frontal sinus tenderness.     Mouth/Throat:     Pharynx: Uvula midline.  Eyes:     General: Lids are normal. Lids are everted, no foreign bodies appreciated.     Conjunctiva/sclera: Conjunctivae normal.     Pupils: Pupils are equal, round, and reactive to light.  Neck:     Thyroid : No thyroid  mass or thyromegaly.     Vascular: No carotid bruit.     Trachea: Trachea normal.  Cardiovascular:     Rate and Rhythm: Normal rate and regular rhythm.     Pulses: Normal pulses.     Heart sounds: Normal heart sounds, S1 normal and S2 normal. No murmur heard.    No friction rub. No gallop.  Pulmonary:     Effort: Pulmonary effort is normal. No tachypnea or respiratory distress.     Breath  sounds: Normal breath sounds. No decreased breath sounds, wheezing, rhonchi or rales.  Abdominal:     General: Bowel sounds are normal.     Palpations: Abdomen is soft.     Tenderness: There is no abdominal tenderness.  Musculoskeletal:     Cervical back: Normal range of motion and neck supple. No rigidity. Pain with movement and muscular tenderness present. Normal range of motion.  Lymphadenopathy:     Cervical: No cervical adenopathy.  Skin:    General: Skin is warm and dry.  Findings: No rash.  Neurological:     Mental Status: She is alert and oriented to person, place, and time.     GCS: GCS eye subscore is 4. GCS verbal subscore is 5. GCS motor subscore is 6.     Cranial Nerves: No cranial nerve deficit.     Sensory: No sensory deficit.     Motor: No abnormal muscle tone.     Coordination: Coordination normal.     Gait: Gait normal.     Deep Tendon Reflexes: Reflexes are normal and symmetric.     Comments: Nml cerebellar exam   No papilledema  Psychiatric:        Mood and Affect: Mood is not anxious or depressed.        Speech: Speech normal.        Behavior: Behavior normal. Behavior is cooperative.        Thought Content: Thought content normal.        Cognition and Memory: Memory is not impaired. She does not exhibit impaired recent memory or impaired remote memory.        Judgment: Judgment normal.       Results for orders placed or performed in visit on 03/24/23  Lipid panel   Collection Time: 03/24/23  8:18 AM  Result Value Ref Range   Cholesterol 204 (H) 0 - 200 mg/dL   Triglycerides 16.1 0.0 - 149.0 mg/dL   HDL 09.60 >45.40 mg/dL   VLDL 98.1 0.0 - 19.1 mg/dL   LDL Cholesterol 478 (H) 0 - 99 mg/dL   Total CHOL/HDL Ratio 3    NonHDL 128.05   Comprehensive metabolic panel   Collection Time: 03/24/23  8:18 AM  Result Value Ref Range   Sodium 137 135 - 145 mEq/L   Potassium 3.9 3.5 - 5.1 mEq/L   Chloride 101 96 - 112 mEq/L   CO2 31 19 - 32 mEq/L    Glucose, Bld 95 70 - 99 mg/dL   BUN 11 6 - 23 mg/dL   Creatinine, Ser 2.95 0.40 - 1.20 mg/dL   Total Bilirubin 0.4 0.2 - 1.2 mg/dL   Alkaline Phosphatase 95 39 - 117 U/L   AST 16 0 - 37 U/L   ALT 10 0 - 35 U/L   Total Protein 7.1 6.0 - 8.3 g/dL   Albumin 4.0 3.5 - 5.2 g/dL   GFR 62.13 >08.65 mL/min   Calcium  9.1 8.4 - 10.5 mg/dL    Assessment and Plan  There are no diagnoses linked to this encounter.  No follow-ups on file.   Herby Lolling, MD

## 2023-08-19 NOTE — Telephone Encounter (Signed)
 Spoke with Tricia Munoz.  She states she is doing much better now. I let her know Rocephin is a strong antibiotic and that is why we mix it the Lidocaine .  I advised not to be surprised if she has leg soreness over the next couple of days and she can put ice on the upper thigh as well as take Tylenol  if needed.  Patient states understanding.

## 2023-08-19 NOTE — Assessment & Plan Note (Signed)
 Acute, Lyme disease likely diagnosis.  Patient now status post 3 doses of doxycycline  but since starting the antibiotic she is feeling worse.  This simply may be secondary to side effects to doxycycline  but I have some level of concern for inadequately treated Lyme disease given she has a low-grade temperature neck stiffness, headache, joint pain despite the antibiotics. Will treat with ceftriaxone 1 g to boost coverage of possible Lyme bacteria. No other clear explanation for symptoms such as upper respiratory tract infection. Will evaluate with tick labs although could be negative given she is status post 3 doses of doxycycline .  Will also check white blood cell count.  She will continue the doxycycline  for 2 more days but if continues to feel poorly we will change this antibiotic to amoxicillin to see if she is feeling poorly due to side effects.  Patient instructed to go to ER if she has severe neck pain, headache, neurologic change or fever above 100.4 on antibiotics.

## 2023-08-19 NOTE — Addendum Note (Signed)
 Addended by: Wyn Heater on: 08/19/2023 12:11 PM   Modules accepted: Orders

## 2023-08-19 NOTE — Telephone Encounter (Signed)
 Agree with recommendations.

## 2023-08-19 NOTE — Telephone Encounter (Signed)
 Copied from CRM [id number]. Topic: Clinical - Medical Advice >> Aug 19, 2023 12:30 PM Tricia Munoz F wrote: Reason for CRM: Patient just left office - she had an antibiotic injection and she's having severe pain in her leg where she got injection, wants to know if this is normal? Please call spouse [name] at [phone] as soon as possible

## 2023-08-22 ENCOUNTER — Ambulatory Visit (INDEPENDENT_AMBULATORY_CARE_PROVIDER_SITE_OTHER): Admitting: Psychology

## 2023-08-22 DIAGNOSIS — F900 Attention-deficit hyperactivity disorder, predominantly inattentive type: Secondary | ICD-10-CM

## 2023-08-22 DIAGNOSIS — F419 Anxiety disorder, unspecified: Secondary | ICD-10-CM | POA: Diagnosis not present

## 2023-08-22 DIAGNOSIS — Z630 Problems in relationship with spouse or partner: Secondary | ICD-10-CM | POA: Diagnosis not present

## 2023-08-22 DIAGNOSIS — F331 Major depressive disorder, recurrent, moderate: Secondary | ICD-10-CM

## 2023-08-22 NOTE — Progress Notes (Signed)
 [facility] Counselor/Therapist Progress Note  Patient ID: Tricia Munoz, MRN: [id number],    Date: 08/22/2023  Time Spent: 53 minutes  Time in:  2:09  Time out: 3:02  Treatment Type: Individual Therapy  Reported Symptoms: sadness, anxiety  Mental Status Exam: Appearance:  Casual     Behavior: Appropriate  Motor: Normal  Speech/Language:  Normal Rate  Affect: blunted  Mood: sad  Thought process: normal  Thought content:   WNL  Sensory/Perceptual disturbances:   WNL  Orientation: oriented to person, place, time/date, and situation  Attention: Good  Concentration: Good  Memory: WNL  Fund of knowledge:  Good  Insight:   Good  Judgment:  poor  Impulse Control: poor   Risk Assessment: Danger to Self:  No Self-injurious Behavior: No Danger to Others: No Duty to Warn:no Physical Aggression / Violence:No  Access to Firearms a concern: No  Gang Involvement:No   Subjective: The patient attended a face-to-face individual therapy session via video visit today.  The patient gave consent for the video visit to be on caregility and she is aware of the limitations of telehealth.  The patient was in her home alone and the therapist was in her home office.  The patient presents with a blunted affect and her mood is somewhat sad.  The patient reports that she had forgotten she had an appointment so she was a little late getting on to the session today.  The patient reports that Lars Poche is going to therapy and that seems to be helping tremendously.  She seems like there is something that is bothering her and I think she is struggling with feeling regretful that she had the affair and that he is having to feel like she has to be up under them all the time.  We talked about it being a process and that at some point it would be healthier for them to be able to do their own things some.  He does require that she is with him most of the time because he is fearful that she may have  another relationship outside of the marriage.  We talked about this and talked about this being a process and that at some point she is going to have to have conversations with them about what it is that she wants to do outside of the home.  I also encouraged her to think about what it was that she does want to do to develop her life so that she is not so totally dependent on the relationship with him for all of her social interactions.  The patient reports that she and her husband are going on vacation next week and I will see her the following week for therapy.  Interventions: Cognitive Behavioral Therapy, Insight-Oriented, Family Systems, and Interpersonal  Diagnosis:Major depressive disorder, recurrent episode, moderate (HCC)  Marital conflict  Attention deficit hyperactivity disorder (ADHD), predominantly inattentive type  Anxiety  Plan:Client Abilities/Strengths  Intelligent, insightful, supportive husband, sober  Client Treatment Preferences  Outpatient Individual therapy weekly or bi weekly  Client Statement of Needs  "I want to be happy with myself"  Treatment Level  Outpatient Individual therapy  Symptoms  Difficulty in saying no to others; assumes not being liked by others.: No Description Entered (Status:  improved). Fear of rejection by others, especially peer group.: No Description Entered (Status:  improved).  Problems Addressed  Low Self-Esteem, Low Self-Esteem, Low Self-Esteem  Goals 1. Demonstrate improved self-esteem through more pride in appearance,  more  assertiveness, greater eye contact, and identification of  positive traits in self-talk messages. 2. Elevate self-esteem. 3. Establish an inward sense of self-worth, confidence, and competence. Objective Identify and replace negative self-talk messages used to reinforce low self-esteem. Target Date: 04/01/2024 Frequency: Weekly Progress: 40 Modality: individual Related Interventions 1. Help the client  identify his/her distorted, negative beliefs about self and the world and replace  these messages with more realistic, affirmative messages (or assign "Journal and Replace SelfDefeating Thoughts" in the Adult Psychotherapy Homework Planner by [facility] or read What  to Say When You Talk to Yourself by [employer]). Objective Identify and engage in activities that would improve self-image by being consistent with one's values. Target Date: 04/01/2024 Frequency: Weekly Progress: 30 Modality: individual Objective Demonstrate an increased ability to identify and express personal feelings. Target Date: 04/01/2024 Frequency: Weekly Progress: 10 Modality: individual Diagnosis Axis none 296.31 (Major depressive affective  disorder, recurrent episode, mild) -   Adjustment  Disorder  Unspecified  Conditions For Discharge Achievement of treatment goals and objectives   Patient approved this treatment plan.  Oni Dietzman G Terelle Dobler, LCSW

## 2023-08-25 LAB — B. BURGDORFI ANTIBODIES BY WB
B burgdorferi IgG Abs (IB): NEGATIVE
B burgdorferi IgM Abs (IB): NEGATIVE
Lyme Disease 18 kD IgG: NONREACTIVE
Lyme Disease 23 kD IgG: NONREACTIVE
Lyme Disease 23 kD IgM: NONREACTIVE
Lyme Disease 28 kD IgG: REACTIVE — AB
Lyme Disease 30 kD IgG: NONREACTIVE
Lyme Disease 39 kD IgG: NONREACTIVE
Lyme Disease 39 kD IgM: REACTIVE — AB
Lyme Disease 41 kD IgG: NONREACTIVE
Lyme Disease 41 kD IgM: NONREACTIVE
Lyme Disease 45 kD IgG: NONREACTIVE
Lyme Disease 58 kD IgG: NONREACTIVE
Lyme Disease 66 kD IgG: NONREACTIVE
Lyme Disease 93 kD IgG: NONREACTIVE

## 2023-08-25 LAB — EHRLICHIA ANTIBODY PANEL
E. CHAFFEENSIS AB IGG: <1:64 {titer}
E. CHAFFEENSIS AB IGM: <1:20 {titer}

## 2023-08-25 LAB — ROCKY MTN SPOTTED FVR ABS PNL(IGG+IGM)
RMSF IgG: NOT DETECTED
RMSF IgM: NOT DETECTED

## 2023-08-26 ENCOUNTER — Ambulatory Visit: Payer: Self-pay | Admitting: Family Medicine

## 2023-09-02 ENCOUNTER — Ambulatory Visit (INDEPENDENT_AMBULATORY_CARE_PROVIDER_SITE_OTHER): Admitting: Psychology

## 2023-09-02 DIAGNOSIS — Z630 Problems in relationship with spouse or partner: Secondary | ICD-10-CM | POA: Diagnosis not present

## 2023-09-02 DIAGNOSIS — F900 Attention-deficit hyperactivity disorder, predominantly inattentive type: Secondary | ICD-10-CM | POA: Diagnosis not present

## 2023-09-02 DIAGNOSIS — F331 Major depressive disorder, recurrent, moderate: Secondary | ICD-10-CM

## 2023-09-02 NOTE — Progress Notes (Signed)
 [facility] Counselor/Therapist Progress Note  Patient ID: Tricia Munoz, MRN: [id number],    Date: 09/02/2023  Time Spent: 56 minutes  Time in:  11:05  Time out: 12:01  Treatment Type: Individual Therapy  Reported Symptoms: sadness, anxiety  Mental Status Exam: Appearance:  Casual     Behavior: Appropriate  Motor: Normal  Speech/Language:  Normal Rate  Affect: normal  Mood: pleasant  Thought process: normal  Thought content:   WNL  Sensory/Perceptual disturbances:   WNL  Orientation: oriented to person, place, time/date, and situation  Attention: Good  Concentration: Good  Memory: WNL  Fund of knowledge:  Good  Insight:   Good  Judgment:  poor  Impulse Control: poor   Risk Assessment: Danger to Self:  No Self-injurious Behavior: No Danger to Others: No Duty to Warn:no Physical Aggression / Violence:No  Access to Firearms a concern: No  Gang Involvement:No   Subjective: The patient attended a face-to-face individual therapy session via video visit today.  The patient gave consent for the video visit to be on caregility and she is aware of the limitations of telehealth.  The patient was in her home alone and the therapist was in her home office.   The patient presents as pleasant and cooperative.  The patient reports that she and her husband went on vacation and they had a wonderful time.  The patient states that they feel like they are doing better with their relationship.  She talked a little about adding social relationships back and eventually that she is taking things slow and I think this is probably a good idea for her.  Her husband is getting therapy and I encouraged her to continue to have him go so that they can work things out.  She talked about an incident that happened over the weekend where he was up at night and he basically told her she did not care about him because she did not have an check on in the middle of the night.  Her perspective was  that she did not want to disturb him if he was resting in his perspective was is that she did not care enough about them to check on him.  We talked about that being codependent thought and I recommended that she get out the book Codependent no more, and look at characteristics again and help him understand that they both has to be less reactive and more conversive and of a deal with problems like that.  We talked about it being reactive in a codependent way on both of their parts.  The patient understood the concepts discussed. Interventions: Cognitive Behavioral Therapy, Insight-Oriented, Family Systems, and Interpersonal  Diagnosis:Major depressive disorder, recurrent episode, moderate (HCC)  Marital conflict  Attention deficit hyperactivity disorder (ADHD), predominantly inattentive type  Plan:Client Abilities/Strengths  Intelligent, insightful, supportive husband, sober  Client Treatment Preferences  Outpatient Individual therapy weekly or bi weekly  Client Statement of Needs  "I want to be happy with myself"  Treatment Level  Outpatient Individual therapy  Symptoms  Difficulty in saying no to others; assumes not being liked by others.: No Description Entered (Status:  improved). Fear of rejection by others, especially peer group.: No Description Entered (Status:  improved).  Problems Addressed  Low Self-Esteem, Low Self-Esteem, Low Self-Esteem  Goals 1. Demonstrate improved self-esteem through more pride in appearance,  more assertiveness, greater eye contact, and identification of  positive traits in self-talk messages. 2. Elevate self-esteem. 3. Establish an inward sense of  self-worth, confidence, and competence. Objective Identify and replace negative self-talk messages used to reinforce low self-esteem. Target Date: 04/01/2024 Frequency: Weekly Progress: 50 Modality: individual Related Interventions 1. Help the client identify his/her distorted, negative beliefs about self  and the world and replace  these messages with more realistic, affirmative messages (or assign "Journal and Replace SelfDefeating Thoughts" in the Adult Psychotherapy Homework Planner by [facility] or read What  to Say When You Talk to Yourself by [employer]). Objective Identify and engage in activities that would improve self-image by being consistent with one's values. Target Date: 04/01/2024 Frequency: Weekly Progress: 30 Modality: individual Objective Demonstrate an increased ability to identify and express personal feelings. Target Date: 04/01/2024 Frequency: Weekly Progress: 20 Modality: individual Diagnosis Axis none 296.31 (Major depressive affective  disorder, recurrent episode, mild) -   Adjustment  Disorder  Unspecified  Conditions For Discharge Achievement of treatment goals and objectives   Patient approved this treatment plan.  Alle Difabio G Ledarrius Beauchaine, LCSW

## 2023-09-11 ENCOUNTER — Other Ambulatory Visit: Payer: Self-pay | Admitting: Family Medicine

## 2023-09-11 DIAGNOSIS — I10 Essential (primary) hypertension: Secondary | ICD-10-CM

## 2023-09-11 NOTE — Telephone Encounter (Signed)
 Called pt and schedule appt for cpe / lab

## 2023-09-11 NOTE — Telephone Encounter (Signed)
 Please schedule CPE with fasting labs prior with Dr. Cherlyn Cornet after 10/10/2023.

## 2023-09-12 ENCOUNTER — Ambulatory Visit (INDEPENDENT_AMBULATORY_CARE_PROVIDER_SITE_OTHER): Admitting: Psychology

## 2023-09-12 DIAGNOSIS — F330 Major depressive disorder, recurrent, mild: Secondary | ICD-10-CM | POA: Diagnosis not present

## 2023-09-12 DIAGNOSIS — Z630 Problems in relationship with spouse or partner: Secondary | ICD-10-CM | POA: Diagnosis not present

## 2023-09-12 DIAGNOSIS — F900 Attention-deficit hyperactivity disorder, predominantly inattentive type: Secondary | ICD-10-CM | POA: Diagnosis not present

## 2023-09-12 NOTE — Progress Notes (Signed)
 [facility] Counselor/Therapist Progress Note  Patient ID: Tricia Munoz, MRN: [id number],    Date: 09/12/2023  Time Spent: 50 minutes  Time in:  2:04  Time out: 2:54  Treatment Type: Individual Therapy  Reported Symptoms: sadness, anxiety  Mental Status Exam: Appearance:  Casual     Behavior: Appropriate  Motor: Normal  Speech/Language:  Normal Rate  Affect: normal  Mood: pleasant  Thought process: normal  Thought content:   WNL  Sensory/Perceptual disturbances:   WNL  Orientation: oriented to person, place, time/date, and situation  Attention: Good  Concentration: Good  Memory: WNL  Fund of knowledge:  Good  Insight:   Good  Judgment:  poor  Impulse Control: poor   Risk Assessment: Danger to Self:  No Self-injurious Behavior: No Danger to Others: No Duty to Warn:no Physical Aggression / Violence:No  Access to Firearms a concern: No  Gang Involvement:No   Subjective: The patient attended a face-to-face individual therapy session via video visit today.  The patient gave consent for the video visit to be on caregility and she is aware of the limitations of telehealth.  The patient was in her home alone and the therapist was in her home office.   The patient presents as pleasant and cooperative.  The patient reports that she and her husband seem to be doing very well.  She is happy that he is going to therapy and she says that she is not sure that he understands that he is getting better because of the therapy.  We talked today about people pleasing and how it is easy to slip back into that way of being if you are not aware of it.  She talked about not driving her car much and being afraid she is going to run into to him when she is out.  I explained to her that I wanted her to get to the place where she could feel secure within herself that she can handle what ever situation comes her way even if she runs into him.  I talked about her people pleasing being more  related to avoiding conflict and that by her not choosing to live her life and get out and about she is essentially people pleasing by avoiding conflict.  The patient understood the concepts discussed and is going to be more aware of moving forward.  Interventions: Cognitive Behavioral Therapy, Insight-Oriented, Family Systems, and Interpersonal  Diagnosis:Major depressive disorder, recurrent episode, mild (HCC)  Attention deficit hyperactivity disorder (ADHD), predominantly inattentive type  Marital conflict  Plan:Client Abilities/Strengths  Intelligent, insightful, supportive husband, sober  Client Treatment Preferences  Outpatient Individual therapy weekly or bi weekly  Client Statement of Needs  "I want to be happy with myself"  Treatment Level  Outpatient Individual therapy  Symptoms  Difficulty in saying no to others; assumes not being liked by others.: No Description Entered (Status:  improved). Fear of rejection by others, especially peer group.: No Description Entered (Status:  improved).  Problems Addressed  Low Self-Esteem, Low Self-Esteem, Low Self-Esteem  Goals 1. Demonstrate improved self-esteem through more pride in appearance,  more assertiveness, greater eye contact, and identification of  positive traits in self-talk messages. 2. Elevate self-esteem. 3. Establish an inward sense of self-worth, confidence, and competence. Objective Identify and replace negative self-talk messages used to reinforce low self-esteem. Target Date: 04/01/2024 Frequency: Weekly Progress: 60 Modality: individual Related Interventions 1. Help the client identify his/her distorted, negative beliefs about self and the world and replace  these messages with more realistic, affirmative messages (or assign "Journal and Replace SelfDefeating Thoughts" in the Adult Psychotherapy Homework Planner by [facility] or read What  to Say When You Talk to Yourself by [employer]). Objective Identify and  engage in activities that would improve self-image by being consistent with one's values. Target Date: 04/01/2024 Frequency: Weekly Progress: 40 Modality: individual Objective Demonstrate an increased ability to identify and express personal feelings. Target Date: 04/01/2024 Frequency: Weekly Progress: 30 Modality: individual Diagnosis Axis none 296.31 (Major depressive affective  disorder, recurrent episode, mild) -   Adjustment  Disorder  Unspecified  Conditions For Discharge Achievement of treatment goals and objectives   Patient approved this treatment plan.  Kindell Strada G Reghan Thul, LCSW

## 2023-09-18 ENCOUNTER — Ambulatory Visit (INDEPENDENT_AMBULATORY_CARE_PROVIDER_SITE_OTHER): Admitting: Psychology

## 2023-09-18 DIAGNOSIS — F900 Attention-deficit hyperactivity disorder, predominantly inattentive type: Secondary | ICD-10-CM | POA: Diagnosis not present

## 2023-09-18 DIAGNOSIS — Z630 Problems in relationship with spouse or partner: Secondary | ICD-10-CM

## 2023-09-18 DIAGNOSIS — F330 Major depressive disorder, recurrent, mild: Secondary | ICD-10-CM

## 2023-09-18 NOTE — Progress Notes (Signed)
 [facility] Counselor/Therapist Progress Note  Patient ID: Tricia Munoz, MRN: [id number],    Date: 09/18/2023  Time Spent: 58 minutes  Time in:  10:01  Time out: 10:59  Treatment Type: Individual Therapy  Reported Symptoms: sadness, anxiety  Mental Status Exam: Appearance:  Casual     Behavior: Appropriate  Motor: Normal  Speech/Language:  Normal Rate  Affect: normal  Mood: pleasant  Thought process: normal  Thought content:   WNL  Sensory/Perceptual disturbances:   WNL  Orientation: oriented to person, place, time/date, and situation  Attention: Good  Concentration: Good  Memory: WNL  Fund of knowledge:  Good  Insight:   Good  Judgment:  poor  Impulse Control: poor   Risk Assessment: Danger to Self:  No Self-injurious Behavior: No Danger to Others: No Duty to Warn:no Physical Aggression / Violence:No  Access to Firearms a concern: No  Gang Involvement:No   Subjective: The patient attended a face-to-face individual therapy session via video visit today.  The patient gave consent for the video visit to be on caregility and she is aware of the limitations of telehealth.  The patient was in her home alone and the therapist was in her home office.   The patient presents as pleasant and cooperative.  The patient and her husband seem to be continuing to do well.  The patient states that they have been both getting along well and he is going to therapy which she says is helping.  She states that her daughter and granddaughter are there today.  We talked about how it is better to be able to assist situations with various people to see what their motivations around friendships are and we talked about how to pay attention to people who are caught up in their own ego.  We talked about how she and her husband were not balanced before and that both were caught up in their own he goes and were not being aware of the struggles that they were creating. Interventions:  Cognitive Behavioral Therapy, Insight-Oriented, Family Systems, and Interpersonal  Diagnosis:Major depressive disorder, recurrent episode, mild (HCC)  Attention deficit hyperactivity disorder (ADHD), predominantly inattentive type  Marital conflict  Plan:Client Abilities/Strengths  Intelligent, insightful, supportive husband, sober  Client Treatment Preferences  Outpatient Individual therapy weekly or bi weekly  Client Statement of Needs  "I want to be happy with myself"  Treatment Level  Outpatient Individual therapy  Symptoms  Difficulty in saying no to others; assumes not being liked by others.: No Description Entered (Status:  improved). Fear of rejection by others, especially peer group.: No Description Entered (Status:  improved).  Problems Addressed  Low Self-Esteem, Low Self-Esteem, Low Self-Esteem  Goals 1. Demonstrate improved self-esteem through more pride in appearance,  more assertiveness, greater eye contact, and identification of  positive traits in self-talk messages. 2. Elevate self-esteem. 3. Establish an inward sense of self-worth, confidence, and competence. Objective Identify and replace negative self-talk messages used to reinforce low self-esteem. Target Date: 04/01/2024 Frequency: Weekly Progress: 60 Modality: individual Related Interventions 1. Help the client identify his/her distorted, negative beliefs about self and the world and replace  these messages with more realistic, affirmative messages (or assign "Journal and Replace SelfDefeating Thoughts" in the Adult Psychotherapy Homework Planner by [facility] or read What  to Say When You Talk to Yourself by [employer]). Objective Identify and engage in activities that would improve self-image by being consistent with one's values. Target Date: 04/01/2024 Frequency: Weekly Progress: 40 Modality: individual Objective  Demonstrate an increased ability to identify and express personal feelings. Target  Date: 04/01/2024 Frequency: Weekly Progress: 30 Modality: individual Diagnosis Axis none 296.31 (Major depressive affective  disorder, recurrent episode, mild) -   Adjustment  Disorder  Unspecified  Conditions For Discharge Achievement of treatment goals and objectives   Patient approved this treatment plan.  Neeya Prigmore G Dezyrae Kensinger, LCSW

## 2023-09-22 ENCOUNTER — Telehealth: Payer: Self-pay | Admitting: *Deleted

## 2023-09-22 DIAGNOSIS — E785 Hyperlipidemia, unspecified: Secondary | ICD-10-CM

## 2023-09-22 NOTE — Telephone Encounter (Signed)
-----   Message from Bernadene Brewer sent at 09/22/2023 10:00 AM EDT ----- Regarding: Lab Tues 10/07/23 Hello,  Patient is coming in for CPE labs on Tuesday 10/07/23. Can we get orders please.   Thanks

## 2023-10-02 ENCOUNTER — Ambulatory Visit (INDEPENDENT_AMBULATORY_CARE_PROVIDER_SITE_OTHER): Admitting: Psychology

## 2023-10-02 DIAGNOSIS — F419 Anxiety disorder, unspecified: Secondary | ICD-10-CM

## 2023-10-02 DIAGNOSIS — Z630 Problems in relationship with spouse or partner: Secondary | ICD-10-CM

## 2023-10-02 DIAGNOSIS — F3341 Major depressive disorder, recurrent, in partial remission: Secondary | ICD-10-CM

## 2023-10-02 NOTE — Progress Notes (Unsigned)
                edge,  experiencing concentration difficulties, having trouble falling or staying asleep, exhibiting a general  state of irritability).: No Description Entered (Status: improved). Motor tension (e.g., restlessness,  tiredness, shakiness, muscle tension).: No Description Entered (Status: improved).  Problems Addressed  Anxiety, Phase Of Life Problems, Anxiety  Goals 1. Learn and implement coping skills that result in a reduction of anxiety  and worry, and improved daily functioning. Objective Learn  and implement calming skills to reduce overall anxiety and manage anxiety symptoms. Target Date: 2025-08-09Frequency: Weekly Progress: 40 Modality: individual  Related Interventions 1. Teach the client calming/relaxation skills (e.g., applied relaxation, progressive muscle  relaxation, cue controlled relaxation; mindful breathing; biofeedback) and how to discriminate  better between relaxation and tension; teach the client how to apply these skills to his/her daily  life (e.g., New Directions in Progressive Muscle Relaxation by Marcelyn Ditty, and  Hazlett-Stevens; Treating Generalized Anxiety Disorder by Rygh and Ida Rogue). Objective Identify, challenge, and replace biased, fearful self-talk with positive, realistic, and empowering selftalk. Target Date: 2023-11-22 Frequency: weekly Progress: 30 Modality: individual Related Interventions 1. Explore the client's schema and self-talk that mediate his/her fear response; assist him/her in  challenging the biases; replace the distorted messages with reality-based alternatives and  positive, realistic self-talk that will increase his/her self-confidence in coping with irrational  fears (see Cognitive Therapy of Anxiety Disorders by Laurence Slate). Objective Learn and implement problem-solving strategies for realistically addressing worries. Target Date: 2025-08-09Frequency: weekly Progress: 40 Modality: individual 2. Resolve conflicted feelings and adapt to the new life circumstances. Objective Apply problem-solving skills to current circumstances. Target Date: 2023-11-22 Frequency: weekly Progress: 20 Modality: individual Related Interventions 1. Teach the client problem-resolution skills (e.g., defining the problem clearly, brainstorming  multiple solutions, listing the pros and cons of each solution, seeking input from others,  selecting and implementing a plan of action, evaluating outcome, and readjusting plan as   necessary).   3. Stabilize anxiety level while increasing ability to function on a daily  basis. Diagnosis F33.1  Major depressive disorder, moderate 300.02 (Generalized anxiety disorder) - Open - [Signifier: n/a]  Axis  none 309.28 (Adjustment disorder with mixed anxiety and depressed  mood) - Open - [Signifier: n/a]  Adjustment Disorder,  With Anxiety   Marital conflict  Major Depressive disorder, moderate  Conditions For Discharge Achievement of treatment goals and objectives.  The patient approved this plan.   Deonna Krummel G Ethridge Sollenberger, LCSW

## 2023-10-07 ENCOUNTER — Other Ambulatory Visit (INDEPENDENT_AMBULATORY_CARE_PROVIDER_SITE_OTHER)

## 2023-10-07 ENCOUNTER — Ambulatory Visit: Payer: Self-pay | Admitting: Family Medicine

## 2023-10-07 DIAGNOSIS — E785 Hyperlipidemia, unspecified: Secondary | ICD-10-CM | POA: Diagnosis not present

## 2023-10-07 LAB — LIPID PANEL
Cholesterol: 202 mg/dL — ABNORMAL HIGH (ref 0–200)
HDL: 92.1 mg/dL (ref >39.00)
LDL Cholesterol: 100 mg/dL — ABNORMAL HIGH (ref 0–99)
NonHDL: 109.73
Total CHOL/HDL Ratio: 2
Triglycerides: 49 mg/dL (ref 0.0–149.0)
VLDL: 9.8 mg/dL (ref 0.0–40.0)

## 2023-10-07 NOTE — Progress Notes (Signed)
 No critical labs need to be addressed urgently. We will discuss labs in detail at upcoming office visit.

## 2023-10-14 ENCOUNTER — Other Ambulatory Visit: Payer: Self-pay | Admitting: Family Medicine

## 2023-10-14 ENCOUNTER — Ambulatory Visit (INDEPENDENT_AMBULATORY_CARE_PROVIDER_SITE_OTHER): Admitting: Psychology

## 2023-10-14 ENCOUNTER — Ambulatory Visit (INDEPENDENT_AMBULATORY_CARE_PROVIDER_SITE_OTHER): Admitting: Family Medicine

## 2023-10-14 ENCOUNTER — Telehealth: Payer: Self-pay

## 2023-10-14 ENCOUNTER — Other Ambulatory Visit (HOSPITAL_COMMUNITY): Payer: Self-pay

## 2023-10-14 VITALS — BP 124/80 | HR 80 | Temp 99.4°F | Ht 60.0 in | Wt 110.5 lb

## 2023-10-14 DIAGNOSIS — F325 Major depressive disorder, single episode, in full remission: Secondary | ICD-10-CM

## 2023-10-14 DIAGNOSIS — Z0000 Encounter for general adult medical examination without abnormal findings: Secondary | ICD-10-CM | POA: Diagnosis not present

## 2023-10-14 DIAGNOSIS — F3341 Major depressive disorder, recurrent, in partial remission: Secondary | ICD-10-CM | POA: Diagnosis not present

## 2023-10-14 DIAGNOSIS — E785 Hyperlipidemia, unspecified: Secondary | ICD-10-CM | POA: Diagnosis not present

## 2023-10-14 DIAGNOSIS — Z630 Problems in relationship with spouse or partner: Secondary | ICD-10-CM | POA: Diagnosis not present

## 2023-10-14 DIAGNOSIS — I10 Essential (primary) hypertension: Secondary | ICD-10-CM | POA: Diagnosis not present

## 2023-10-14 MED ORDER — HYDROCORT-PRAMOXINE (PERIANAL) 2.5-1 % EX CREA: TOPICAL_CREAM | Freq: Three times a day (TID) | CUTANEOUS | 0 refills | Status: DC

## 2023-10-14 MED ORDER — HYDROCORTISONE (PERIANAL) 2.5 % EX CREA: 1.0000 | TOPICAL_CREAM | Freq: Two times a day (BID) | CUTANEOUS | 0 refills | Status: AC

## 2023-10-14 NOTE — Assessment & Plan Note (Signed)
Stable, chronic.  Continue current medication.  Benazepril 10 mg p.o. daily, HCTZ 12.5 mg p.o. daily

## 2023-10-14 NOTE — Telephone Encounter (Signed)
 Pharmacy Patient Advocate Encounter   Received notification from Physician's Office that prior authorization for Analpram-HC 2.5-1% rectal cream is required/requested.   Insurance verification completed.   The patient is insured through [employer] .   Per test claim: patient has plan benefit exclusion, product/ service not covered

## 2023-10-14 NOTE — Progress Notes (Signed)
 [facility] Counselor/Therapist Progress Note  Patient ID: Tricia Munoz, MRN: [id number],    Date: 10/14/2023  Time Spent: 58 minutes  Time in:  10:02  Time out: 11:00  Treatment Type: Individual Therapy  Reported Symptoms: sadness, anxiety  Mental Status Exam: Appearance:  Casual     Behavior: Appropriate  Motor: Normal  Speech/Language:  Normal Rate  Affect: normal  Mood: pleasant  Thought process: normal  Thought content:   WNL  Sensory/Perceptual disturbances:   WNL  Orientation: oriented to person, place, time/date, and situation  Attention: Good  Concentration: Good  Memory: WNL  Fund of knowledge:  Good  Insight:   Good  Judgment:  poor  Impulse Control: poor   Risk Assessment: Danger to Self:  No Self-injurious Behavior: No Danger to Others: No Duty to Warn:no Physical Aggression / Violence:No  Access to Firearms a concern: No  Gang Involvement:No   Subjective: The patient attended a face-to-face individual therapy session via video visit today.  The patient gave consent for the video visit to be on caregility and she is aware of the limitations of telehealth.  The patient was in her home alone and the therapist was in her home office.   The patient presents as pleasant and cooperative.  The patient was smiling today and this is good to see because over the last several months that she has been back in therapy she often times would be crying for a lot of the session.  The patient reports that she and her husband went to a family gathering of his family and that it went well.  She did state that she feels like the tools that we have worked with have really helped her to be able to understand simple and understand her reaction to things.  We processed and talked about her continuing to being mindful about what she is choosing and how she is interacting with her husband. Interventions: Cognitive Behavioral Therapy, Insight-Oriented, Family Systems, and  Interpersonal  Diagnosis:Recurrent major depressive disorder, in partial remission (HCC)  Marital conflict  Plan:Client Abilities/Strengths  Intelligent, insightful, supportive husband, sober  Client Treatment Preferences  Outpatient Individual therapy weekly or bi weekly  Client Statement of Needs  I want to be happy with myself  Treatment Level  Outpatient Individual therapy  Symptoms  Difficulty in saying no to others; assumes not being liked by others.: No Description Entered (Status:  improved). Fear of rejection by others, especially peer group.: No Description Entered (Status:  improved).  Problems Addressed  Low Self-Esteem, Low Self-Esteem, Low Self-Esteem  Goals 1. Demonstrate improved self-esteem through more pride in appearance,  more assertiveness, greater eye contact, and identification of  positive traits in self-talk messages. 2. Elevate self-esteem. 3. Establish an inward sense of self-worth, confidence, and competence. Objective Identify and replace negative self-talk messages used to reinforce low self-esteem. Target Date: 04/01/2024 Frequency: Weekly Progress: 70 Modality: individual Related Interventions 1. Help the client identify his/her distorted, negative beliefs about self and the world and replace  these messages with more realistic, affirmative messages (or assign Journal and Replace SelfDefeating Thoughts in the Adult Psychotherapy Homework Planner by Jenniffer or read What  to Say When You Talk to Yourself by Helmstetter). Objective Identify and engage in activities that would improve self-image by being consistent with one's values. Target Date: 04/01/2024 Frequency: Weekly Progress: 50 Modality: individual Objective Demonstrate an increased ability to identify and express personal feelings. Target Date: 04/01/2024 Frequency: Weekly Progress: 40 Modality: individual Diagnosis  Axis none 296.31 (Major depressive affective  disorder, recurrent  episode, mild) -   Adjustment  Disorder  Unspecified  Conditions For Discharge Achievement of treatment goals and objectives   Patient approved this treatment plan.  Yarnell Kozloski G Sheranda Seabrooks, LCSW

## 2023-10-14 NOTE — Telephone Encounter (Signed)
 I sent in an alternate medication but her insurance does not appear to cover any medications containing pramoxine, only covers medications with hydrocortisone  alone.

## 2023-10-14 NOTE — Assessment & Plan Note (Addendum)
Stable, chronic.  Continue current medication.  Simvastatin 40 mg p.o. daily  

## 2023-10-14 NOTE — Progress Notes (Signed)
 Patient ID: Tricia Munoz, female    DOB: 17-Jul-1960, 63 y.o.   MRN: [id number]  This visit was conducted in person.  BP 124/80   Pulse 80   Temp 99.4 F (37.4 C) (Temporal)   Ht 5' (1.524 m)   Wt 110 lb 8 oz (50.1 kg)   LMP 11/06/2012   SpO2 97%   BMI 21.58 kg/m    CC:  Chief Complaint  Patient presents with   Welcome to Medicare    Subjective:   HPI: Tricia Munoz is a 63 y.o. female presenting on 10/14/2023 for Welcome to Medicare   The patient presents for  welcome to medicare and review of chronic health problems. He/She also has the following acute concerns today:  I have personally reviewed the Medicare Annual Wellness questionnaire and have noted 1. The patient's medical and social history 2. Their use of alcohol, tobacco or illicit drugs 3. Their current medications and supplements 4. The patient's functional ability including ADL's, fall risks, home safety risks and hearing or visual             impairment. 5. Diet and physical activities 6. Evidence for depression or mood disorders 7.         Updated provider list Cognitive evaluation was performed and recorded on pt medicare questionnaire form. The patients weight, height, BMI and visual acuity have been recorded in the chart   I have made referrals, counseling and provided education to the patient based review of the above and I have provided the pt with a written personalized care plan for preventive services.   Documentation of this information was scanned into the electronic record under the media tab.   Advance directives and end of life planning reviewed in detail with patient and documented in EMR. Patient given handout on advance care directives if  needed. HCPOA and living will updated if needed.  No falls in last 12 months.  Vision Screening   Right eye Left eye Both eyes  Without correction     With correction 20/20 20/30 20/15   Hearing Screening - Comments:: Wears Bilateral Hearing  Aides   Hypertension:  Well-controlled on Benazepril  10 mg p.o. daily, HCTZ 12.5 mg p.o. daily Using medication without problems or lightheadedness:  BP Readings from Last 3 Encounters:  10/14/23 124/80  08/19/23 (!) 158/90  08/17/23 130/81   Elevated Cholesterol: LDL at goal < 100 simvastatin  40 mg daily. Lab Results  Component Value Date   CHOL 202 (H) 10/07/2023   HDL 92.10 10/07/2023   LDLCALC 100 (H) 10/07/2023   LDLDIRECT 112.5 04/30/2013   TRIG 49.0 10/07/2023   CHOLHDL 2 10/07/2023  Using medications without problems: Muscle aches: none Diet compliance: heart healthy diet Exercise: weekly, plan starting walking at [facility] Other complaints: The 10-year ASCVD risk score (Arnett DK, et al., 2019) is: 5%   Values used to calculate the score:     Age: 69 years     Clincally relevant sex: Female     Is Non-Hispanic African American: No     Diabetic: No     Tobacco smoker: No     Systolic Blood Pressure: 124 mmHg     Is BP treated: Yes     HDL Cholesterol: 92.1 mg/dL     Total Cholesterol: 202 mg/dL   History of anemia.SABRA with over- donation of blood.  Mild persistent asthma   Symbicort  80-4.5 mg and prn albuterol    She has osteoarthritis as well as  positive RF, but no definitive dx of RA.  MDD/GAD: Well controlled on venlafaxine , has reduced to 150 mg daily.  Doing counseling.  GAD7:0 and PHQ9:0     10/14/2023    2:31 PM 07/16/2023   11:37 AM 04/03/2023    4:02 PM  Depression screen PHQ 2/9  Decreased Interest 0 0 0  Down, Depressed, Hopeless 0 0 3  PHQ - 2 Score 0 0 3  Altered sleeping 0 0 0  Tired, decreased energy 0 0 3  Change in appetite 0 0 3  Feeling bad or failure about yourself  0 0 3  Trouble concentrating 0 0 3  Moving slowly or fidgety/restless 0 0 3  Suicidal thoughts 0 0 1  PHQ-9 Score 0 0 19  Difficult doing work/chores Not difficult at all Not difficult at all Extremely dIfficult    Major depressive disorder and anxiety , menopausal  symptoms in past, she is a sober former alcoholic.  Followed by Peggye Macintosh, counselor in past. Relevant past medical, surgical, family and social history reviewed and updated as indicated. Interim medical history since our last visit reviewed. Allergies and medications reviewed and updated. Outpatient Medications Prior to Visit  Medication Sig Dispense Refill   albuterol  (VENTOLIN  HFA) 108 (90 Base) MCG/ACT inhaler Inhale 2 puffs into the lungs every 4 (four) hours as needed for wheezing or shortness of breath. 18 g 2   azelastine  (OPTIVAR ) 0.05 % ophthalmic solution Place 1 drop into both eyes 2 (two) times daily. 6 mL 11   Azelastine -Fluticasone  137-50 MCG/ACT SUSP 2 spray in each nostril daily 23 g 11   benazepril  (LOTENSIN ) 10 MG tablet TAKE 1 TABLET BY MOUTH EVERY DAY 90 tablet 3   budesonide -formoterol  (SYMBICORT ) 80-4.5 MCG/ACT inhaler Inhale 2 puffs into the lungs in the morning and at bedtime. 1 each 0   Cholecalciferol (VITAMIN D) 2000 units CAPS Take by mouth.     hydrochlorothiazide  (HYDRODIURIL ) 12.5 MG tablet TAKE 1 TABLET BY MOUTH EVERY DAY 30 tablet 2   hydrocortisone  (ANUSOL -HC) 25 MG suppository Place 1 suppository (25 mg total) rectally daily. 6 suppository 0   levocetirizine (XYZAL ) 5 MG tablet TAKE ONE TABLET BY MOUTH EACH EVENING 30 tablet 11   Multiple Vitamins-Minerals (WOMENS MULTIVITAMIN PO) Take by mouth.     simvastatin  (ZOCOR ) 40 MG tablet Take 1 tablet (40 mg total) by mouth daily. 90 tablet 3   venlafaxine  XR (EFFEXOR -XR) 150 MG 24 hr capsule Take 1 capsule (150 mg total) by mouth daily with breakfast. 90 capsule 0   vitamin C (ASCORBIC ACID) 250 MG tablet Take 250 mg by mouth daily.     hydrocortisone -pramoxine (ANALPRAM-HC) 2.5-1 % rectal cream Place rectally 3 (three) times daily. 30 g 0   venlafaxine  XR (EFFEXOR -XR) 75 MG 24 hr capsule TAKE 1 CAPSULE BY MOUTH DAILY WITH BREAKFAST. 90 capsule 1   ondansetron  (ZOFRAN -ODT) 4 MG disintegrating tablet Take 1 tablet  (4 mg total) by mouth 2 (two) times daily as needed for nausea or vomiting. 20 tablet 0   triazolam (HALCION) 0.25 MG tablet Prior to dental procedure     No facility-administered medications prior to visit.     Per HPI unless specifically indicated in ROS section below Review of Systems  Constitutional:  Negative for fatigue and fever.  HENT:  Negative for congestion.   Eyes:  Negative for pain.  Respiratory:  Negative for cough and shortness of breath.   Cardiovascular:  Negative for chest pain, palpitations and leg  swelling.  Gastrointestinal:  Negative for abdominal pain.  Genitourinary:  Negative for dysuria and vaginal bleeding.  Musculoskeletal:  Negative for back pain.  Neurological:  Negative for syncope, light-headedness and headaches.  Psychiatric/Behavioral:  Negative for dysphoric mood.    Objective:  BP 124/80   Pulse 80   Temp 99.4 F (37.4 C) (Temporal)   Ht 5' (1.524 m)   Wt 110 lb 8 oz (50.1 kg)   LMP 11/06/2012   SpO2 97%   BMI 21.58 kg/m   Wt Readings from Last 3 Encounters:  10/14/23 110 lb 8 oz (50.1 kg)  08/19/23 113 lb 2 oz (51.3 kg)  07/16/23 111 lb (50.3 kg)      Physical Exam Vitals and nursing note reviewed.  Constitutional:      General: She is not in acute distress.    Appearance: Normal appearance. She is well-developed. She is not ill-appearing or toxic-appearing.  HENT:     Head: Normocephalic.     Right Ear: Hearing, tympanic membrane, ear canal and external ear normal.     Left Ear: Hearing, tympanic membrane, ear canal and external ear normal.     Nose: Nose normal.   Eyes:     General: Lids are normal. Lids are everted, no foreign bodies appreciated.     Conjunctiva/sclera: Conjunctivae normal.     Pupils: Pupils are equal, round, and reactive to light.   Neck:     Thyroid : No thyroid  mass or thyromegaly.     Vascular: No carotid bruit.     Trachea: Trachea normal.   Cardiovascular:     Rate and Rhythm: Normal rate and  regular rhythm.     Heart sounds: Normal heart sounds, S1 normal and S2 normal. No murmur heard.    No gallop.  Pulmonary:     Effort: Pulmonary effort is normal. No respiratory distress.     Breath sounds: Normal breath sounds. No wheezing, rhonchi or rales.  Abdominal:     General: Bowel sounds are normal. There is no distension or abdominal bruit.     Palpations: Abdomen is soft. There is no fluid wave or mass.     Tenderness: There is no abdominal tenderness. There is no guarding or rebound.     Hernia: No hernia is present.   Musculoskeletal:     Cervical back: Normal range of motion and neck supple.  Lymphadenopathy:     Cervical: No cervical adenopathy.   Skin:    General: Skin is warm and dry.     Findings: No rash.   Neurological:     Mental Status: She is alert.     Cranial Nerves: No cranial nerve deficit.     Sensory: No sensory deficit.   Psychiatric:        Mood and Affect: Mood is not anxious or depressed.        Speech: Speech normal.        Behavior: Behavior normal. Behavior is cooperative.        Judgment: Judgment normal.       Results for orders placed or performed in visit on 10/07/23  Lipid panel   Collection Time: 10/07/23  8:06 AM  Result Value Ref Range   Cholesterol 202 (H) 0 - 200 mg/dL   Triglycerides 50.9 0.0 - 149.0 mg/dL   HDL 07.89 >60.99 mg/dL   VLDL 9.8 0.0 - 59.9 mg/dL   LDL Cholesterol 899 (H) 0 - 99 mg/dL   Total CHOL/HDL Ratio 2  NonHDL 109.73     Assessment and Plan The patient's preventative maintenance and recommended screening tests for an annual wellness exam were reviewed in full today. Brought up to date unless services declined.  Counselled on the importance of diet, exercise, and its role in overall health and mortality. The patient's FH and SH was reviewed, including their home life, tobacco status, and drug and alcohol status.   Vaccines: Uptodate with flu, Tdap, PNA Pap/DVE:  06/21/2021 Mammo:  10/2022 Bone  Density: 04/2023 osteopenia Colon:  Father with colon cancer. Neg cologuard in 2020.. colonoscopy 11/2022, repeat q 3 years Smoking Status: none ETOH/ drug ldz:wnwz/wnwz  Hep C:  done  HIV screen:    Welcome to Medicare preventive visit  Primary hypertension Assessment & Plan: Stable, chronic.  Continue current medication.  Benazepril  10 mg p.o. daily, HCTZ 12.5 mg p.o. daily   Hyperlipidemia, unspecified hyperlipidemia type Assessment & Plan: Stable, chronic.  Continue current medication.   Simvastatin  40 mg p.o. daily.   MDD (major depressive disorder), single episode, in full remission (HCC) Assessment & Plan: Stable, chronic.  Continue current medication.   venlafaxine  XR to 150 mg p.o. daily.    Other orders -     Hydrocort -Pramoxine (Perianal); Place rectally 3 (three) times daily.  Dispense: 30 g; Refill: 0     No follow-ups on file.   Greig Ring, MD

## 2023-10-14 NOTE — Addendum Note (Signed)
 Addended by: AVELINA NO E on: 10/14/2023 05:07 PM   Modules accepted: Orders

## 2023-10-14 NOTE — Assessment & Plan Note (Addendum)
 Stable, chronic.  Continue current medication.   venlafaxine  XR to 150 mg p.o. daily.

## 2023-10-15 NOTE — Telephone Encounter (Signed)
 Tricia Munoz notified as instructed by telephone.  Patient states understanding.

## 2023-10-23 ENCOUNTER — Ambulatory Visit (INDEPENDENT_AMBULATORY_CARE_PROVIDER_SITE_OTHER)
Admission: RE | Admit: 2023-10-23 | Discharge: 2023-10-23 | Disposition: A | Discharge status: Home / Self Care | Source: Ambulatory Visit | Attending: Family Medicine | Admitting: Family Medicine

## 2023-10-23 ENCOUNTER — Ambulatory Visit (INDEPENDENT_AMBULATORY_CARE_PROVIDER_SITE_OTHER): Admitting: Family Medicine

## 2023-10-23 ENCOUNTER — Encounter: Payer: Self-pay | Admitting: Family Medicine

## 2023-10-23 VITALS — BP 140/84 | HR 68 | Temp 97.9°F | Ht 60.0 in | Wt 112.1 lb

## 2023-10-23 DIAGNOSIS — M7918 Myalgia, other site: Secondary | ICD-10-CM

## 2023-10-23 DIAGNOSIS — M4125 Other idiopathic scoliosis, thoracolumbar region: Secondary | ICD-10-CM | POA: Diagnosis not present

## 2023-10-23 DIAGNOSIS — M2170 Unequal limb length (acquired), unspecified site: Secondary | ICD-10-CM | POA: Diagnosis not present

## 2023-10-23 DIAGNOSIS — M48061 Spinal stenosis, lumbar region without neurogenic claudication: Secondary | ICD-10-CM | POA: Diagnosis not present

## 2023-10-23 DIAGNOSIS — M419 Scoliosis, unspecified: Secondary | ICD-10-CM | POA: Diagnosis not present

## 2023-10-23 DIAGNOSIS — M47816 Spondylosis without myelopathy or radiculopathy, lumbar region: Secondary | ICD-10-CM | POA: Diagnosis not present

## 2023-10-23 DIAGNOSIS — M438X6 Other specified deforming dorsopathies, lumbar region: Secondary | ICD-10-CM | POA: Diagnosis not present

## 2023-10-23 NOTE — Progress Notes (Signed)
 Patient ID: Tricia Munoz, female    DOB: [birth year], 63 y.o.   MRN: [id number]  This visit was conducted in person.  BP (!) 140/84   Pulse 68   Temp 97.9 F (36.6 C) (Temporal)   Ht 5' (1.524 m)   Wt 112 lb 2 oz (50.9 kg)   LMP 11/06/2012   SpO2 98%   BMI 21.90 kg/m    CC:  Chief Complaint  Patient presents with   Left Buttock Pain   Knot Left Lower Back    Patient thinks its due to her scoliosis    Subjective:   HPI: Tricia Munoz is a 63 y.o. female presenting on 10/23/2023 for Left Buttock Pain and Knot Left Lower Back (Patient thinks its due to her scoliosis)  She reports new onset left buttock pain ongoing x 2 weeks, off and on.. Worse when more active.  Radiation to right foot at time.  Cramps at times in left foot.  No new numbness, no new weakness.   No fall, does pick up granddaughter.   She is using tylenol   or ibuprofen  off and on.. ibuprofen  hurts stamoch at times.  She is also noted a knot in her left lower back.  She has a history of scoliosis.  No past back suyrgeries.   Relevant past medical, surgical, family and social history reviewed and updated as indicated. Interim medical history since our last visit reviewed. Allergies and medications reviewed and updated. Outpatient Medications Prior to Visit  Medication Sig Dispense Refill   albuterol  (VENTOLIN  HFA) 108 (90 Base) MCG/ACT inhaler Inhale 2 puffs into the lungs every 4 (four) hours as needed for wheezing or shortness of breath. 18 g 2   azelastine  (OPTIVAR ) 0.05 % ophthalmic solution Place 1 drop into both eyes 2 (two) times daily. 6 mL 11   Azelastine -Fluticasone  137-50 MCG/ACT SUSP 2 spray in each nostril daily 23 g 11   benazepril  (LOTENSIN ) 10 MG tablet TAKE 1 TABLET BY MOUTH EVERY DAY 90 tablet 3   budesonide -formoterol  (SYMBICORT ) 80-4.5 MCG/ACT inhaler Inhale 2 puffs into the lungs in the morning and at bedtime. 1 each 0   Cholecalciferol (VITAMIN D) 2000 units CAPS Take by  mouth.     hydrochlorothiazide  (HYDRODIURIL ) 12.5 MG tablet TAKE 1 TABLET BY MOUTH EVERY DAY 30 tablet 2   hydrocortisone  (ANUSOL -HC) 2.5 % rectal cream Place 1 Application rectally 2 (two) times daily. 30 g 0   hydrocortisone  (ANUSOL -HC) 25 MG suppository Place 1 suppository (25 mg total) rectally daily. 6 suppository 0   levocetirizine (XYZAL ) 5 MG tablet TAKE ONE TABLET BY MOUTH EACH EVENING 30 tablet 11   Multiple Vitamins-Minerals (WOMENS MULTIVITAMIN PO) Take by mouth.     simvastatin  (ZOCOR ) 40 MG tablet Take 1 tablet (40 mg total) by mouth daily. 90 tablet 3   venlafaxine  XR (EFFEXOR -XR) 150 MG 24 hr capsule Take 1 capsule (150 mg total) by mouth daily with breakfast. 90 capsule 0   vitamin C (ASCORBIC ACID) 250 MG tablet Take 250 mg by mouth daily.     No facility-administered medications prior to visit.     Per HPI unless specifically indicated in ROS section below Review of Systems  Constitutional:  Negative for fatigue and fever.  HENT:  Negative for congestion.   Eyes:  Negative for pain.  Respiratory:  Negative for cough and shortness of breath.   Cardiovascular:  Negative for chest pain, palpitations and leg swelling.  Gastrointestinal:  Negative for abdominal  pain.  Genitourinary:  Negative for dysuria and vaginal bleeding.  Musculoskeletal:  Positive for back pain.  Neurological:  Negative for syncope, light-headedness and headaches.  Psychiatric/Behavioral:  Negative for dysphoric mood.    Objective:  BP (!) 140/84   Pulse 68   Temp 97.9 F (36.6 C) (Temporal)   Ht 5' (1.524 m)   Wt 112 lb 2 oz (50.9 kg)   LMP 11/06/2012   SpO2 98%   BMI 21.90 kg/m   Wt Readings from Last 3 Encounters:  10/23/23 112 lb 2 oz (50.9 kg)  10/14/23 110 lb 8 oz (50.1 kg)  08/19/23 113 lb 2 oz (51.3 kg)      Physical Exam Constitutional:      General: She is not in acute distress.    Appearance: Normal appearance. She is well-developed. She is not ill-appearing or  toxic-appearing.  HENT:     Head: Normocephalic.     Right Ear: Hearing, tympanic membrane, ear canal and external ear normal. Tympanic membrane is not erythematous, retracted or bulging.     Left Ear: Hearing, tympanic membrane, ear canal and external ear normal. Tympanic membrane is not erythematous, retracted or bulging.     Nose: No mucosal edema or rhinorrhea.     Right Sinus: No maxillary sinus tenderness or frontal sinus tenderness.     Left Sinus: No maxillary sinus tenderness or frontal sinus tenderness.     Mouth/Throat:     Pharynx: Uvula midline.  Eyes:     General: Lids are normal. Lids are everted, no foreign bodies appreciated.     Conjunctiva/sclera: Conjunctivae normal.     Pupils: Pupils are equal, round, and reactive to light.  Neck:     Thyroid : No thyroid  mass or thyromegaly.     Vascular: No carotid bruit.     Trachea: Trachea normal.  Cardiovascular:     Rate and Rhythm: Normal rate and regular rhythm.     Pulses: Normal pulses.     Heart sounds: Normal heart sounds, S1 normal and S2 normal. No murmur heard.    No friction rub. No gallop.  Pulmonary:     Effort: Pulmonary effort is normal. No tachypnea or respiratory distress.     Breath sounds: Normal breath sounds. No decreased breath sounds, wheezing, rhonchi or rales.  Abdominal:     General: Bowel sounds are normal.     Palpations: Abdomen is soft.     Tenderness: There is no abdominal tenderness.  Musculoskeletal:     Cervical back: Normal, normal range of motion and neck supple.     Thoracic back: Scoliosis present.     Lumbar back: Tenderness present. No bony tenderness. Normal range of motion. Negative right straight leg raise test and negative left straight leg raise test. Scoliosis present.     Right hip: Normal. No tenderness. Normal range of motion.     Left hip: Normal. No tenderness. Normal range of motion.     Right knee: Normal.     Left knee: Normal.     Comments: TTP over left sciatic  notch  Skin:    General: Skin is warm and dry.     Findings: No rash.  Neurological:     Mental Status: She is alert and oriented to person, place, and time.     GCS: GCS eye subscore is 4. GCS verbal subscore is 5. GCS motor subscore is 6.     Cranial Nerves: No cranial nerve deficit.  Sensory: No sensory deficit.     Motor: No abnormal muscle tone.     Coordination: Coordination normal.     Gait: Gait normal.     Deep Tendon Reflexes: Reflexes are normal and symmetric.     Comments: Nml cerebellar exam   No papilledema  Psychiatric:        Mood and Affect: Mood is not anxious or depressed.        Speech: Speech normal.        Behavior: Behavior normal. Behavior is cooperative.        Thought Content: Thought content normal.        Cognition and Memory: Memory is not impaired. She does not exhibit impaired recent memory or impaired remote memory.        Judgment: Judgment normal.       Results for orders placed or performed in visit on 10/07/23  Lipid panel   Collection Time: 10/07/23  8:06 AM  Result Value Ref Range   Cholesterol 202 (H) 0 - 200 mg/dL   Triglycerides 50.9 0.0 - 149.0 mg/dL   HDL 07.89 >60.99 mg/dL   VLDL 9.8 0.0 - 59.9 mg/dL   LDL Cholesterol 899 (H) 0 - 99 mg/dL   Total CHOL/HDL Ratio 2    NonHDL 109.73     Assessment and Plan  Other idiopathic scoliosis, thoracolumbar region -     DG Lumbar Spine Complete; Future  Left buttock pain Assessment & Plan: Acute, in the setting of significant scoliosis and likely leg length discrepancy.  Right hip higher than left hip. Will evaluate with plain films for extent of scoliosis and osteoarthritis. Encouraged patient to do home physical therapy and use Tylenol  extra strength arthritis 2 tablets up to 3 times a day as needed for pain. Offered formal physical therapy and consideration of referral to Dr. Watt for leg length discrepancy/orthotics, but she would like to hold off at this time.  Will move  forward with this depending on x-ray results.  Orders: -     DG Lumbar Spine Complete; Future  Leg length discrepancy -     DG Lumbar Spine Complete; Future    No follow-ups on file.   Greig Ring, MD

## 2023-10-23 NOTE — Assessment & Plan Note (Signed)
 Acute, in the setting of significant scoliosis and likely leg length discrepancy.  Right hip higher than left hip. Will evaluate with plain films for extent of scoliosis and osteoarthritis. Encouraged patient to do home physical therapy and use Tylenol  extra strength arthritis 2 tablets up to 3 times a day as needed for pain. Offered formal physical therapy and consideration of referral to Dr. Watt for leg length discrepancy/orthotics, but she would like to hold off at this time.  Will move forward with this depending on x-ray results.

## 2023-10-28 ENCOUNTER — Ambulatory Visit: Payer: Self-pay | Admitting: Family Medicine

## 2023-10-31 ENCOUNTER — Ambulatory Visit (INDEPENDENT_AMBULATORY_CARE_PROVIDER_SITE_OTHER): Admitting: Psychology

## 2023-10-31 DIAGNOSIS — F419 Anxiety disorder, unspecified: Secondary | ICD-10-CM

## 2023-10-31 DIAGNOSIS — Z630 Problems in relationship with spouse or partner: Secondary | ICD-10-CM

## 2023-10-31 DIAGNOSIS — F3341 Major depressive disorder, recurrent, in partial remission: Secondary | ICD-10-CM

## 2023-10-31 NOTE — Progress Notes (Signed)
  Behavioral Health Counselor/Therapist Progress Note  Patient ID: Tricia Munoz, MRN: [id number],    Date: 10/31/2023  Time Spent: 58 minutes  Time in:  12:02  Time out: 1:00  Treatment Type: Individual Therapy  Reported Symptoms: sadness, anxiety  Mental Status Exam: Appearance:  Casual     Behavior: Appropriate  Motor: Normal  Speech/Language:  Normal Rate  Affect: normal  Mood: pleasant  Thought process: normal  Thought content:   WNL  Sensory/Perceptual disturbances:   WNL  Orientation: oriented to person, place, time/date, and situation  Attention: Good  Concentration: Good  Memory: WNL  Fund of knowledge:  Good  Insight:   Good  Judgment:  poor  Impulse Control: poor   Risk Assessment: Danger to Self:  No Self-injurious Behavior: No Danger to Others: No Duty to Warn:no Physical Aggression / Violence:No  Access to Firearms a concern: No  Gang Involvement:No   Subjective: The patient attended a face-to-face individual therapy session via video visit today.  The patient gave consent for the video visit to be on caregility and she is aware of the limitations of telehealth.  The patient was in her home alone and the therapist was in her home office.   The patient presents as pleasant and cooperative. The patient was tearful at times.  The patient reports that she and Lewis are doing well.  She says that she has stopped having her relationship with her friend Erminio.  We talked about this being a good time to be able to look at the relationships that are worth keeping.  She has limited her circle and I encouraged her to continue to set limits with people that are not good for her, but not to close herself off to an extreme.  The patient is doing better because she is taking responsibility for her actions, but she is not beating herself up anymore.  I told her that I was proud of her and her husband for doing what they are doing to try to make things work.    Interventions: Cognitive Behavioral Therapy, Insight-Oriented, Family Systems, and Interpersonal  Diagnosis:Recurrent major depressive disorder, in partial remission (HCC)  Marital conflict  Anxiety  Plan:Client Abilities/Strengths  Intelligent, insightful, supportive husband, sober  Client Treatment Preferences  Outpatient Individual therapy weekly or bi weekly  Client Statement of Needs  I want to be happy with myself  Treatment Level  Outpatient Individual therapy  Symptoms  Difficulty in saying no to others; assumes not being liked by others.: No Description Entered (Status:  improved). Fear of rejection by others, especially peer group.: No Description Entered (Status:  improved).  Problems Addressed  Low Self-Esteem, Low Self-Esteem, Low Self-Esteem  Goals 1. Demonstrate improved self-esteem through more pride in appearance,  more assertiveness, greater eye contact, and identification of  positive traits in self-talk messages. 2. Elevate self-esteem. 3. Establish an inward sense of self-worth, confidence, and competence. Objective Identify and replace negative self-talk messages used to reinforce low self-esteem. Target Date: 04/01/2024 Frequency: Weekly Progress: 70 Modality: individual Related Interventions 1. Help the client identify his/her distorted, negative beliefs about self and the world and replace  these messages with more realistic, affirmative messages (or assign Journal and Replace SelfDefeating Thoughts in the Adult Psychotherapy Homework Planner by [employer] or read What  to Say When You Talk to Yourself by [employer]). Objective Identify and engage in activities that would improve self-image by being consistent with one's values. Target Date: 04/01/2024 Frequency: Weekly Progress: 50 Modality: individual  Objective Demonstrate an increased ability to identify and express personal feelings. Target Date: 04/01/2024 Frequency: Weekly Progress: 40  Modality: individual Diagnosis Axis none 296.31 (Major depressive affective  disorder, recurrent episode, mild) -   Adjustment  Disorder  Unspecified  Conditions For Discharge Achievement of treatment goals and objectives   Patient approved this treatment plan.  Yifan Auker G Nahiem Dredge, LCSW

## 2023-11-04 ENCOUNTER — Ambulatory Visit: Payer: Self-pay

## 2023-11-04 NOTE — Telephone Encounter (Signed)
 FYI Only or Action Required?: FYI only for provider.  Patient was last seen in primary care on 10/23/2023 by Avelina Greig BRAVO, MD.  Called Nurse Triage reporting No chief complaint on file..  Symptoms began several days ago.  Interventions attempted: Prescription medications: Taking blood pressure medications.  Symptoms are: unchanged.  Triage Disposition: See PCP Within 2 Weeks  Patient/caregiver understands and will follow disposition?: Yes      Copied from CRM [id number]. Topic: Clinical - Red Word Triage >> Nov 04, 2023 10:35 AM Tricia Munoz wrote: Red Word that prompted transfer to Nurse Triage: High BP (currently)  152/99 (this morning) past weekend 149/94 , 157/100 (yesterday)     Reason for Disposition  [1] Systolic BP >= 130 OR Diastolic >= 80 AND [2] taking BP medications  Answer Assessment - Initial Assessment Questions 1. BLOOD PRESSURE: What is your blood pressure? Did you take at least two measurements 5 minutes apart?     152/99 This morning, 157/100 yesterday, 149/94 this past weekend  2. ONSET: When did you take your blood pressure?     This morning  3. HOW: How did you take your blood pressure? (e.g., automatic home BP monitor, visiting nurse)     Automatic BP cuff 4. HISTORY: Do you have a history of high blood pressure?     Yes 5. MEDICINES: Are you taking any medicines for blood pressure? Have you missed any doses recently?     Has not missed any doses  6. OTHER SYMPTOMS: Do you have any symptoms? (e.g., blurred vision, chest pain, difficulty breathing, headache, weakness)     Intermittent headaches  Protocols used: Blood Pressure - High-A-AH

## 2023-11-04 NOTE — Progress Notes (Unsigned)
     Jaidon Ellery T. Caddie Randle, MD, CAQ Sports Medicine [facility] at [facility] [address] [place], [address]  Phone: [phone]  FAX: [phone]  KRISHAWNA STIEFEL - 63 y.o. female  MRN [id number]  Date of Birth: 03/03/1961  Date: 11/05/2023  PCP: Avelina Greig BRAVO, MD  Referral: Avelina Greig BRAVO, MD  No chief complaint on file.  Subjective:   Tricia Munoz is a 63 y.o. very pleasant female patient with There is no height or weight on file to calculate BMI. who presents with the following:  She is a very pleasant patient who I have seen multiple times in the past, she presents for evaluation referred by Dr. Avelina for ongoing back pain.  There is a question of leg length discrepancy.    Review of Systems is noted in the HPI, as appropriate  Objective:   LMP 11/06/2012   GEN: No acute distress; alert,appropriate. PULM: Breathing comfortably in no respiratory distress PSYCH: Normally interactive.   Laboratory and Imaging Data:  Assessment and Plan:   ***

## 2023-11-05 ENCOUNTER — Ambulatory Visit (INDEPENDENT_AMBULATORY_CARE_PROVIDER_SITE_OTHER): Admitting: Family Medicine

## 2023-11-05 ENCOUNTER — Other Ambulatory Visit: Payer: Self-pay | Admitting: Medical Genetics

## 2023-11-05 ENCOUNTER — Encounter: Payer: Self-pay | Admitting: Family Medicine

## 2023-11-05 VITALS — BP 120/74 | HR 93 | Temp 99.0°F | Ht 60.0 in | Wt 113.1 lb

## 2023-11-05 DIAGNOSIS — M2170 Unequal limb length (acquired), unspecified site: Secondary | ICD-10-CM

## 2023-11-05 DIAGNOSIS — M4125 Other idiopathic scoliosis, thoracolumbar region: Secondary | ICD-10-CM

## 2023-11-05 NOTE — Patient Instructions (Signed)
 Calcium  800 mg a day  Vit D 2,000 units a day  Curcumin (Tumeric) Tart cherry  Glucosamine and chondrointin reasonable

## 2023-11-06 ENCOUNTER — Encounter: Payer: Self-pay | Admitting: Family Medicine

## 2023-11-08 ENCOUNTER — Other Ambulatory Visit: Payer: Self-pay | Admitting: Family Medicine

## 2023-11-08 DIAGNOSIS — I10 Essential (primary) hypertension: Secondary | ICD-10-CM

## 2023-11-11 ENCOUNTER — Encounter: Payer: Self-pay | Admitting: Family Medicine

## 2023-11-11 ENCOUNTER — Ambulatory Visit: Payer: Self-pay | Admitting: Family Medicine

## 2023-11-11 ENCOUNTER — Ambulatory Visit (INDEPENDENT_AMBULATORY_CARE_PROVIDER_SITE_OTHER): Admitting: Family Medicine

## 2023-11-11 VITALS — BP 129/69 | HR 64 | Temp 97.1°F | Ht 60.0 in | Wt 112.5 lb

## 2023-11-11 DIAGNOSIS — I10 Essential (primary) hypertension: Secondary | ICD-10-CM

## 2023-11-11 LAB — COMPREHENSIVE METABOLIC PANEL WITH GFR
ALT: 11 U/L (ref 0–35)
AST: 16 U/L (ref 0–37)
Albumin: 4.7 g/dL (ref 3.5–5.2)
Alkaline Phosphatase: 97 U/L (ref 39–117)
BUN: 11 mg/dL (ref 6–23)
CO2: 29 meq/L (ref 19–32)
Calcium: 10.4 mg/dL (ref 8.4–10.5)
Chloride: 102 meq/L (ref 96–112)
Creatinine, Ser: 0.76 mg/dL (ref 0.40–1.20)
GFR: 82.47 mL/min (ref >60.00)
Glucose, Bld: 87 mg/dL (ref 70–99)
Potassium: 4.9 meq/L (ref 3.5–5.1)
Sodium: 139 meq/L (ref 135–145)
Total Bilirubin: 0.4 mg/dL (ref 0.2–1.2)
Total Protein: 7.1 g/dL (ref 6.0–8.3)

## 2023-11-11 LAB — TSH: TSH: 1.52 u[IU]/mL (ref 0.35–5.50)

## 2023-11-11 MED ORDER — HYDROCHLOROTHIAZIDE 25 MG PO TABS: 25.0000 mg | ORAL_TABLET | Freq: Every day | ORAL | 11 refills | Status: AC

## 2023-11-11 NOTE — Assessment & Plan Note (Signed)
 Chronic, recent blood pressure fluctuation.  Will evaluate with labs to determine if there is a secondary cause.  If normal labs we will have her increase hydrochlorothiazide  to 25 mg daily.  Continue benazepril  10 mg daily.  She will follow blood pressure at home but will contact me if her blood pressure is remaining above 140/90. If blood pressure may need up we can consider combining benazepril  and hydrochlorothiazide  with a 20/25 mg tablet.

## 2023-11-11 NOTE — Progress Notes (Signed)
 Patient ID: Tricia Munoz, female    DOB: 1960/04/28, 63 y.o.   MRN: [id number]  This visit was conducted in person.  BP 129/69   Pulse 64   Temp (!) 97.1 F (36.2 C) (Temporal)   Ht 5' (1.524 m)   Wt 112 lb 8 oz (51 kg)   LMP 11/06/2012   SpO2 99%   BMI 21.97 kg/m    CC:  Chief Complaint  Patient presents with   Hypertension    Subjective:   HPI: Tricia Munoz is a 63 y.o. female presenting on 11/11/2023 for Hypertension  Hypertension:   At home she has noted blood pressure fluctuation.  On benazepril  10 mg daily  Hydrochlorothiazide  12.5 mg daily BP Readings from Last 3 Encounters:  11/11/23 129/69  11/05/23 120/74  10/23/23 (!) 140/84  Using medication without problems or lightheadedness:  none Mild headache, fatigue, sluggishness Chest pain with exertion: none Edema:none Short of breath:none Average home BPs: >140/90 to169/100 Other issues:   No change in caffeine  Some stress  in past, better now.     CMET  and cbc nml  08/19/2023  Relevant past medical, surgical, family and social history reviewed and updated as indicated. Interim medical history since our last visit reviewed. Allergies and medications reviewed and updated. Outpatient Medications Prior to Visit  Medication Sig Dispense Refill   albuterol  (VENTOLIN  HFA) 108 (90 Base) MCG/ACT inhaler Inhale 2 puffs into the lungs every 4 (four) hours as needed for wheezing or shortness of breath. 18 g 2   azelastine  (OPTIVAR ) 0.05 % ophthalmic solution Place 1 drop into both eyes 2 (two) times daily. 6 mL 11   Azelastine -Fluticasone  137-50 MCG/ACT SUSP 2 spray in each nostril daily 23 g 11   benazepril  (LOTENSIN ) 10 MG tablet TAKE 1 TABLET BY MOUTH EVERY DAY 30 tablet 11   budesonide -formoterol  (SYMBICORT ) 80-4.5 MCG/ACT inhaler Inhale 2 puffs into the lungs in the morning and at bedtime. 1 each 0   Cholecalciferol (VITAMIN D) 2000 units CAPS Take by mouth.     hydrocortisone  (ANUSOL -HC) 2.5 % rectal  cream Place 1 Application rectally 2 (two) times daily. 30 g 0   hydrocortisone  (ANUSOL -HC) 25 MG suppository Place 1 suppository (25 mg total) rectally daily. 6 suppository 0   levocetirizine (XYZAL ) 5 MG tablet TAKE ONE TABLET BY MOUTH EACH EVENING 30 tablet 11   Multiple Vitamins-Minerals (WOMENS MULTIVITAMIN PO) Take by mouth.     simvastatin  (ZOCOR ) 40 MG tablet Take 1 tablet (40 mg total) by mouth daily. 90 tablet 3   venlafaxine  XR (EFFEXOR -XR) 150 MG 24 hr capsule Take 1 capsule (150 mg total) by mouth daily with breakfast. 90 capsule 0   vitamin C (ASCORBIC ACID) 250 MG tablet Take 250 mg by mouth daily.     hydrochlorothiazide  (HYDRODIURIL ) 12.5 MG tablet TAKE 1 TABLET BY MOUTH EVERY DAY 30 tablet 2   No facility-administered medications prior to visit.     Per HPI unless specifically indicated in ROS section below Review of Systems  Constitutional:  Negative for fatigue and fever.  HENT:  Negative for congestion.   Eyes:  Negative for pain.  Respiratory:  Negative for cough and shortness of breath.   Cardiovascular:  Negative for chest pain, palpitations and leg swelling.  Gastrointestinal:  Negative for abdominal pain.  Genitourinary:  Negative for dysuria and vaginal bleeding.  Musculoskeletal:  Negative for back pain.  Neurological:  Negative for syncope, light-headedness and headaches.  Psychiatric/Behavioral:  Negative for dysphoric mood.    Objective:  BP 129/69   Pulse 64   Temp (!) 97.1 F (36.2 C) (Temporal)   Ht 5' (1.524 m)   Wt 112 lb 8 oz (51 kg)   LMP 11/06/2012   SpO2 99%   BMI 21.97 kg/m   Wt Readings from Last 3 Encounters:  11/11/23 112 lb 8 oz (51 kg)  11/05/23 113 lb 2 oz (51.3 kg)  10/23/23 112 lb 2 oz (50.9 kg)      Physical Exam Constitutional:      General: She is not in acute distress.    Appearance: Normal appearance. She is well-developed. She is not ill-appearing or toxic-appearing.  HENT:     Head: Normocephalic.     Right Ear:  Hearing, tympanic membrane, ear canal and external ear normal. Tympanic membrane is not erythematous, retracted or bulging.     Left Ear: Hearing, tympanic membrane, ear canal and external ear normal. Tympanic membrane is not erythematous, retracted or bulging.     Nose: No mucosal edema or rhinorrhea.     Right Sinus: No maxillary sinus tenderness or frontal sinus tenderness.     Left Sinus: No maxillary sinus tenderness or frontal sinus tenderness.     Mouth/Throat:     Pharynx: Uvula midline.  Eyes:     General: Lids are normal. Lids are everted, no foreign bodies appreciated.     Conjunctiva/sclera: Conjunctivae normal.     Pupils: Pupils are equal, round, and reactive to light.  Neck:     Thyroid : No thyroid  mass or thyromegaly.     Vascular: No carotid bruit.     Trachea: Trachea normal.  Cardiovascular:     Rate and Rhythm: Normal rate and regular rhythm.     Pulses: Normal pulses.     Heart sounds: Normal heart sounds, S1 normal and S2 normal. No murmur heard.    No friction rub. No gallop.  Pulmonary:     Effort: Pulmonary effort is normal. No tachypnea or respiratory distress.     Breath sounds: Normal breath sounds. No decreased breath sounds, wheezing, rhonchi or rales.  Abdominal:     General: Bowel sounds are normal.     Palpations: Abdomen is soft.     Tenderness: There is no abdominal tenderness.  Musculoskeletal:     Cervical back: Normal range of motion and neck supple.  Skin:    General: Skin is warm and dry.     Findings: No rash.  Neurological:     Mental Status: She is alert.  Psychiatric:        Mood and Affect: Mood is not anxious or depressed.        Speech: Speech normal.        Behavior: Behavior normal. Behavior is cooperative.        Thought Content: Thought content normal.        Judgment: Judgment normal.       Results for orders placed or performed in visit on 11/11/23  Comprehensive metabolic panel with GFR   Collection Time: 11/11/23  12:14 PM  Result Value Ref Range   Sodium 139 135 - 145 mEq/L   Potassium 4.9 3.5 - 5.1 mEq/L   Chloride 102 96 - 112 mEq/L   CO2 29 19 - 32 mEq/L   Glucose, Bld 87 70 - 99 mg/dL   BUN 11 6 - 23 mg/dL   Creatinine, Ser 9.23 0.40 - 1.20 mg/dL   Total Bilirubin  0.4 0.2 - 1.2 mg/dL   Alkaline Phosphatase 97 39 - 117 U/L   AST 16 0 - 37 U/L   ALT 11 0 - 35 U/L   Total Protein 7.1 6.0 - 8.3 g/dL   Albumin 4.7 3.5 - 5.2 g/dL   GFR 17.52 >39.99 mL/min   Calcium  10.4 8.4 - 10.5 mg/dL  TSH   Collection Time: 11/11/23 12:14 PM  Result Value Ref Range   TSH 1.52 0.35 - 5.50 uIU/mL    Assessment and Plan  Primary hypertension Assessment & Plan: Chronic, recent blood pressure fluctuation.  Will evaluate with labs to determine if there is a secondary cause.  If normal labs we will have her increase hydrochlorothiazide  to 25 mg daily.  Continue benazepril  10 mg daily.  She will follow blood pressure at home but will contact me if her blood pressure is remaining above 140/90. If blood pressure may need up we can consider combining benazepril  and hydrochlorothiazide  with a 20/25 mg tablet.  Orders: -     Comprehensive metabolic panel with GFR -     TSH  Other orders -     hydroCHLOROthiazide ; Take 1 tablet (25 mg total) by mouth daily.  Dispense: 30 tablet; Refill: 11    No follow-ups on file.   Greig Ring, MD

## 2023-11-14 ENCOUNTER — Ambulatory Visit (INDEPENDENT_AMBULATORY_CARE_PROVIDER_SITE_OTHER): Admitting: Psychology

## 2023-11-14 DIAGNOSIS — F3341 Major depressive disorder, recurrent, in partial remission: Secondary | ICD-10-CM

## 2023-11-14 DIAGNOSIS — Z630 Problems in relationship with spouse or partner: Secondary | ICD-10-CM

## 2023-11-14 DIAGNOSIS — F419 Anxiety disorder, unspecified: Secondary | ICD-10-CM | POA: Diagnosis not present
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
## 2023-11-25 ENCOUNTER — Other Ambulatory Visit: Payer: Self-pay | Admitting: Family Medicine

## 2023-12-04 ENCOUNTER — Ambulatory Visit (INDEPENDENT_AMBULATORY_CARE_PROVIDER_SITE_OTHER): Admitting: Psychology

## 2023-12-04 DIAGNOSIS — F419 Anxiety disorder, unspecified: Secondary | ICD-10-CM | POA: Diagnosis not present

## 2023-12-04 DIAGNOSIS — F3341 Major depressive disorder, recurrent, in partial remission: Secondary | ICD-10-CM | POA: Diagnosis not present

## 2023-12-04 DIAGNOSIS — Z630 Problems in relationship with spouse or partner: Secondary | ICD-10-CM
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
## 2023-12-08 ENCOUNTER — Other Ambulatory Visit: Payer: Self-pay | Admitting: Family Medicine

## 2023-12-08 DIAGNOSIS — I10 Essential (primary) hypertension: Secondary | ICD-10-CM

## 2023-12-16 ENCOUNTER — Other Ambulatory Visit: Payer: Self-pay | Admitting: Family Medicine

## 2023-12-16 DIAGNOSIS — Z1231 Encounter for screening mammogram for malignant neoplasm of breast: Secondary | ICD-10-CM

## 2023-12-22 ENCOUNTER — Ambulatory Visit
Admission: RE | Admit: 2023-12-22 | Discharge: 2023-12-22 | Disposition: A | Discharge status: Home / Self Care | Source: Ambulatory Visit | Attending: Family Medicine | Admitting: Family Medicine

## 2023-12-22 DIAGNOSIS — Z1231 Encounter for screening mammogram for malignant neoplasm of breast: Secondary | ICD-10-CM | POA: Diagnosis not present

## 2023-12-23 ENCOUNTER — Ambulatory Visit (INDEPENDENT_AMBULATORY_CARE_PROVIDER_SITE_OTHER): Admitting: Psychology

## 2023-12-23 DIAGNOSIS — F3341 Major depressive disorder, recurrent, in partial remission: Secondary | ICD-10-CM | POA: Diagnosis not present

## 2023-12-23 DIAGNOSIS — Z630 Problems in relationship with spouse or partner: Secondary | ICD-10-CM | POA: Diagnosis not present

## 2023-12-23 DIAGNOSIS — F419 Anxiety disorder, unspecified: Secondary | ICD-10-CM
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
## 2023-12-25 ENCOUNTER — Ambulatory Visit: Payer: Self-pay | Admitting: Family Medicine

## 2024-01-06 ENCOUNTER — Ambulatory Visit: Admitting: Psychology

## 2024-01-06 ENCOUNTER — Other Ambulatory Visit: Payer: Self-pay | Admitting: Family Medicine

## 2024-01-21 ENCOUNTER — Encounter: Payer: Self-pay | Admitting: Family Medicine

## 2024-01-21 ENCOUNTER — Ambulatory Visit (INDEPENDENT_AMBULATORY_CARE_PROVIDER_SITE_OTHER): Admitting: Family Medicine

## 2024-01-21 VITALS — BP 138/82 | HR 73 | Temp 99.7°F | Ht 60.0 in | Wt 112.2 lb

## 2024-01-21 DIAGNOSIS — J0100 Acute maxillary sinusitis, unspecified: Secondary | ICD-10-CM | POA: Diagnosis not present

## 2024-01-21 MED ORDER — ALBUTEROL SULFATE HFA 108 (90 BASE) MCG/ACT IN AERS: 2.0000 | INHALATION_SPRAY | RESPIRATORY_TRACT | 2 refills | Status: AC | PRN

## 2024-01-21 MED ORDER — AZITHROMYCIN 250 MG PO TABS: ORAL_TABLET | ORAL | 0 refills | Status: AC

## 2024-01-21 NOTE — Progress Notes (Signed)
 Patient ID: Tricia Munoz, female    DOB: 1960/11/03, 63 y.o.   MRN: [id number]  This visit was conducted in person.  BP 138/82   Pulse 73   Temp 99.7 F (37.6 C) (Oral)   Ht 5' (1.524 m)   Wt 112 lb 3.2 oz (50.9 kg)   LMP 11/06/2012   SpO2 98%   BMI 21.91 kg/m    CC:  Chief Complaint  Patient presents with   Generalized Body Aches    Started last week. Patient took a covid and flu test that was negative   Sinus Pressure    Subjective:   HPI: Tricia Munoz is a 63 y.o. female presenting on 01/21/2024 for Generalized Body Aches (Started last week. Patient took a covid and flu test that was negative) and Sinus Pressure   Date of onset: 1 week ago Initial symptoms included  congestion, headache Symptoms progressed to sinus pressure and pain, low grade fever  Ears full, mild ST.   Some cough and wheeze. No severe SOB... has not had to use albuterol .   Sick contacts:  none COVID testing:   Negative COVID and flu test  She has tried to treat with    Has albuterol  to use as needed... has not used lately History of mild persistent asthma, allergies. Non-smoker.   Leaving on trip in [place].     Relevant past medical, surgical, family and social history reviewed and updated as indicated. Interim medical history since our last visit reviewed. Allergies and medications reviewed and updated. Outpatient Medications Prior to Visit  Medication Sig Dispense Refill   azelastine  (OPTIVAR ) 0.05 % ophthalmic solution Place 1 drop into both eyes 2 (two) times daily. 6 mL 11   Azelastine -Fluticasone  137-50 MCG/ACT SUSP 2 spray in each nostril daily 23 g 11   benazepril  (LOTENSIN ) 10 MG tablet TAKE 1 TABLET BY MOUTH EVERY DAY 30 tablet 11   budesonide -formoterol  (SYMBICORT ) 80-4.5 MCG/ACT inhaler Inhale 2 puffs into the lungs in the morning and at bedtime. 1 each 0   Cholecalciferol (VITAMIN D) 2000 units CAPS Take by mouth.     hydrochlorothiazide  (HYDRODIURIL ) 25 MG tablet Take 1  tablet (25 mg total) by mouth daily. 30 tablet 11   hydrocortisone  (ANUSOL -HC) 2.5 % rectal cream Place 1 Application rectally 2 (two) times daily. 30 g 0   hydrocortisone  (ANUSOL -HC) 25 MG suppository Place 1 suppository (25 mg total) rectally daily. 6 suppository 0   levocetirizine (XYZAL ) 5 MG tablet TAKE ONE TABLET BY MOUTH EACH EVENING 30 tablet 11   Multiple Vitamins-Minerals (WOMENS MULTIVITAMIN PO) Take by mouth.     simvastatin  (ZOCOR ) 40 MG tablet Take 1 tablet (40 mg total) by mouth daily. 90 tablet 3   venlafaxine  XR (EFFEXOR -XR) 150 MG 24 hr capsule TAKE 1 CAPSULE BY MOUTH DAILY WITH BREAKFAST. 30 capsule 5   vitamin C (ASCORBIC ACID) 250 MG tablet Take 250 mg by mouth daily.     albuterol  (VENTOLIN  HFA) 108 (90 Base) MCG/ACT inhaler Inhale 2 puffs into the lungs every 4 (four) hours as needed for wheezing or shortness of breath. 18 g 2   No facility-administered medications prior to visit.     Per HPI unless specifically indicated in ROS section below Review of Systems  Constitutional:  Positive for fatigue. Negative for fever.  HENT:  Positive for congestion, sinus pressure, sinus pain and sore throat.   Eyes:  Negative for pain.  Respiratory:  Negative for cough and shortness  of breath.   Cardiovascular:  Negative for chest pain, palpitations and leg swelling.  Gastrointestinal:  Negative for abdominal pain.  Genitourinary:  Negative for dysuria and vaginal bleeding.  Musculoskeletal:  Negative for back pain.  Neurological:  Negative for syncope, light-headedness and headaches.  Psychiatric/Behavioral:  Negative for dysphoric mood.    Objective:  BP 138/82   Pulse 73   Temp 99.7 F (37.6 C) (Oral)   Ht 5' (1.524 m)   Wt 112 lb 3.2 oz (50.9 kg)   LMP 11/06/2012   SpO2 98%   BMI 21.91 kg/m   Wt Readings from Last 3 Encounters:  01/21/24 112 lb 3.2 oz (50.9 kg)  11/11/23 112 lb 8 oz (51 kg)  11/05/23 113 lb 2 oz (51.3 kg)      Physical Exam Constitutional:       General: She is not in acute distress.    Appearance: Normal appearance. She is well-developed. She is not ill-appearing or toxic-appearing.  HENT:     Head: Normocephalic.     Right Ear: Hearing, tympanic membrane, ear canal and external ear normal. Tympanic membrane is not erythematous, retracted or bulging.     Left Ear: Hearing, tympanic membrane, ear canal and external ear normal. Tympanic membrane is not erythematous, retracted or bulging.     Nose: No mucosal edema or rhinorrhea.     Right Sinus: Maxillary sinus tenderness present. No frontal sinus tenderness.     Left Sinus: Maxillary sinus tenderness present. No frontal sinus tenderness.     Mouth/Throat:     Pharynx: Uvula midline.  Eyes:     General: Lids are normal. Lids are everted, no foreign bodies appreciated.     Conjunctiva/sclera: Conjunctivae normal.     Pupils: Pupils are equal, round, and reactive to light.  Neck:     Thyroid : No thyroid  mass or thyromegaly.     Vascular: No carotid bruit.     Trachea: Trachea normal.  Cardiovascular:     Rate and Rhythm: Normal rate and regular rhythm.     Pulses: Normal pulses.     Heart sounds: Normal heart sounds, S1 normal and S2 normal. No murmur heard.    No friction rub. No gallop.  Pulmonary:     Effort: Pulmonary effort is normal. No tachypnea or respiratory distress.     Breath sounds: Normal breath sounds. No decreased breath sounds, wheezing, rhonchi or rales.  Abdominal:     General: Bowel sounds are normal.     Palpations: Abdomen is soft.     Tenderness: There is no abdominal tenderness.  Musculoskeletal:     Cervical back: Normal range of motion and neck supple.  Skin:    General: Skin is warm and dry.     Findings: No rash.  Neurological:     Mental Status: She is alert.  Psychiatric:        Mood and Affect: Mood is not anxious or depressed.        Speech: Speech normal.        Behavior: Behavior normal. Behavior is cooperative.        Thought  Content: Thought content normal.        Judgment: Judgment normal.       Results for orders placed or performed in visit on 11/11/23  Comprehensive metabolic panel with GFR   Collection Time: 11/11/23 12:14 PM  Result Value Ref Range   Sodium 139 135 - 145 mEq/L   Potassium 4.9 3.5 -  5.1 mEq/L   Chloride 102 96 - 112 mEq/L   CO2 29 19 - 32 mEq/L   Glucose, Bld 87 70 - 99 mg/dL   BUN 11 6 - 23 mg/dL   Creatinine, Ser 9.23 0.40 - 1.20 mg/dL   Total Bilirubin 0.4 0.2 - 1.2 mg/dL   Alkaline Phosphatase 97 39 - 117 U/L   AST 16 0 - 37 U/L   ALT 11 0 - 35 U/L   Total Protein 7.1 6.0 - 8.3 g/dL   Albumin 4.7 3.5 - 5.2 g/dL   GFR 17.52 >39.99 mL/min   Calcium  10.4 8.4 - 10.5 mg/dL  TSH   Collection Time: 11/11/23 12:14 PM  Result Value Ref Range   TSH 1.52 0.35 - 5.50 uIU/mL    Assessment and Plan  Acute non-recurrent maxillary sinusitis Assessment & Plan:  Acute, possible viral or bacterial sinusitis.  Negative COVID and flu testing. Recommend starting with nasal saline irrigation and nasal steroid such as Flonase 2 sprays per nostril daily.  Suggested prednisone  as well but patient has significant side effects to this medication. She will also start a Z-Pak to treat possible bacterial superinfection given symptoms are going on longer than a week.  Recommend fluid and rest.  Return and ER precautions provided.   Other orders -     Albuterol  Sulfate HFA; Inhale 2 puffs into the lungs every 4 (four) hours as needed for wheezing or shortness of breath.  Dispense: 18 g; Refill: 2 -     Azithromycin ; Take 2 tablets on day 1, then 1 tablet daily on days 2 through 5  Dispense: 6 tablet; Refill: 0    No follow-ups on file.   Greig Ring, MD

## 2024-01-21 NOTE — Assessment & Plan Note (Signed)
 Acute, possible viral or bacterial sinusitis.  Negative COVID and flu testing. Recommend starting with nasal saline irrigation and nasal steroid such as Flonase 2 sprays per nostril daily.  Suggested prednisone  as well but patient has significant side effects to this medication. She will also start a Z-Pak to treat possible bacterial superinfection given symptoms are going on longer than a week.  Recommend fluid and rest.  Return and ER precautions provided.

## 2024-01-31 ENCOUNTER — Other Ambulatory Visit: Payer: Self-pay | Admitting: Family Medicine

## 2024-02-03 ENCOUNTER — Ambulatory Visit: Admitting: Psychology

## 2024-02-10 ENCOUNTER — Other Ambulatory Visit: Payer: Self-pay | Admitting: Medical Genetics

## 2024-02-10 DIAGNOSIS — Z006 Encounter for examination for normal comparison and control in clinical research program: Secondary | ICD-10-CM

## 2024-02-24 ENCOUNTER — Ambulatory Visit (INDEPENDENT_AMBULATORY_CARE_PROVIDER_SITE_OTHER): Admitting: Psychology

## 2024-02-24 DIAGNOSIS — F3340 Major depressive disorder, recurrent, in remission, unspecified: Secondary | ICD-10-CM | POA: Diagnosis not present

## 2024-02-24 DIAGNOSIS — Z630 Problems in relationship with spouse or partner: Secondary | ICD-10-CM | POA: Diagnosis not present

## 2024-02-24 NOTE — Progress Notes (Unsigned)
 [facility] Counselor/Therapist Progress Note  Patient ID: Tricia Munoz, MRN: [id number],    Date: 02/24/2024  Time Spent: 54 minutes  Time in:  10:07  Time out:11:01  Treatment Type: Individual Therapy  Reported Symptoms: sadness, anxiety  Mental Status Exam: Appearance:  Casual     Behavior: Appropriate  Motor: Normal  Speech/Language:  Normal Rate  Affect: normal  Mood: pleasant  Thought process: normal  Thought content:   WNL  Sensory/Perceptual disturbances:   WNL  Orientation: oriented to person, place, time/date, and situation  Attention: Good  Concentration: Good  Memory: WNL  Fund of knowledge:  Good  Insight:   Good  Judgment:  poor  Impulse Control: poor   Risk Assessment: Danger to Self:  No Self-injurious Behavior: No Danger to Others: No Duty to Warn:no Physical Aggression / Violence:No  Access to Firearms a concern: No  Gang Involvement:No   Subjective: The patient attended a face-to-face individual therapy session via video visit today.  The patient gave consent for the video visit to be on caregility and she is aware of the limitations of telehealth.  The patient was in her home alone and the therapist was in her home office.   The patient presents as pleasant and cooperative.  The patient continues to report that things are going well with she and her husband.  We talked about what they are doing together and they are doing much more together than they were doing prior to the affair.  The patient has come along well and she no longer is tearful all the time and it also seems that her husband had some excellent progress in therapy as well.  We talked about her graduating today and she is aware that I will be retiring in March and we talked about what needs to happen if she needs therapy in the future.  The patient is aware of how to get in touch with our office if she needs therapy moving forward and I do believe that she is ready to  graduate.  Interventions: Cognitive Behavioral Therapy, Insight-Oriented, Family Systems, and Interpersonal  Diagnosis:Recurrent major depression in remission  Marital conflict  Plan:Client Abilities/Strengths  Intelligent, insightful, supportive husband, sober  Client Treatment Preferences  Outpatient Individual therapy weekly or bi weekly  Client Statement of Needs  I want to be happy with myself  Treatment Level  Outpatient Individual therapy  Symptoms  Difficulty in saying no to others; assumes not being liked by others.: No Description Entered (Status:  improved). Fear of rejection by others, especially peer group.: No Description Entered (Status:  improved).  Problems Addressed  Low Self-Esteem, Low Self-Esteem, Low Self-Esteem  Goals 1. Demonstrate improved self-esteem through more pride in appearance,  more assertiveness, greater eye contact, and identification of  positive traits in self-talk messages. 2. Elevate self-esteem. 3. Establish an inward sense of self-worth, confidence, and competence. Objective Identify and replace negative self-talk messages used to reinforce low self-esteem. Target Date: 04/01/2024 Frequency: Weekly Progress: 90 Modality: individual Related Interventions 1. Help the client identify his/her distorted, negative beliefs about self and the world and replace  these messages with more realistic, affirmative messages (or assign Journal and Replace SelfDefeating Thoughts in the Adult Psychotherapy Homework Planner by Jenniffer or read What  to Say When You Talk to Yourself by Helmstetter). Objective Identify and engage in activities that would improve self-image by being consistent with one's values. Target Date: 04/01/2024 Frequency: Weekly Progress: 60 Modality: individual Objective Demonstrate an  increased ability to identify and express personal feelings. Target Date: 04/01/2024 Frequency: Weekly Progress: 90 Modality:  individual Diagnosis Axis none 296.31 (Major depressive affective  disorder, recurrent episode, mild) -   Adjustment  Disorder  Unspecified  Conditions For Discharge Achievement of treatment goals and objectives   Patient approved this treatment plan.  Janie Strothman G Cheris Tweten, LCSW

## 2024-04-06 ENCOUNTER — Encounter: Payer: Self-pay | Admitting: Pharmacist

## 2024-04-06 NOTE — Progress Notes (Signed)
 Pharmacy Quality Measure Review  This patient is appearing on a report for being at risk of failing the adherence measure for hypertension (ACEi/ARB) medications this calendar year.   Medication: benazepril  10 mg Last fill date: 11/19 for 30 day supply  Insurance report was not up to date. No action needed at this time.  Medication refilled as of 04/01/24 x30 ds.
